# Patient Record
Sex: Female | Born: 1990
Health system: Southern US, Community
[De-identification: ages and names within clinical notes are randomized; demographics above are authoritative.]

## PROBLEM LIST (undated history)

## (undated) ENCOUNTER — Encounter

## (undated) ENCOUNTER — Ambulatory Visit

## (undated) ENCOUNTER — Telehealth

## (undated) ENCOUNTER — Ambulatory Visit: Payer: BLUE CROSS/BLUE SHIELD

## (undated) ENCOUNTER — Encounter
Attending: Student in an Organized Health Care Education/Training Program | Primary: Student in an Organized Health Care Education/Training Program

## (undated) DIAGNOSIS — F909 Attention-deficit hyperactivity disorder, unspecified type: Secondary | ICD-10-CM

## (undated) DIAGNOSIS — Q210 Ventricular septal defect: Secondary | ICD-10-CM

## (undated) DIAGNOSIS — D573 Sickle-cell trait: Secondary | ICD-10-CM

## (undated) DIAGNOSIS — R7303 Prediabetes: Secondary | ICD-10-CM

## (undated) DIAGNOSIS — A048 Other specified bacterial intestinal infections: Secondary | ICD-10-CM

## (undated) DIAGNOSIS — O020 Blighted ovum and nonhydatidiform mole: Secondary | ICD-10-CM

## (undated) DIAGNOSIS — R011 Cardiac murmur, unspecified: Secondary | ICD-10-CM

## (undated) DIAGNOSIS — E669 Obesity, unspecified: Secondary | ICD-10-CM

## (undated) DIAGNOSIS — Z973 Presence of spectacles and contact lenses: Secondary | ICD-10-CM

## (undated) DIAGNOSIS — O26872 Cervical shortening, second trimester: Secondary | ICD-10-CM

## (undated) DIAGNOSIS — E785 Hyperlipidemia, unspecified: Secondary | ICD-10-CM

## (undated) DIAGNOSIS — R7301 Impaired fasting glucose: Secondary | ICD-10-CM

## (undated) DIAGNOSIS — A749 Chlamydial infection, unspecified: Secondary | ICD-10-CM

## (undated) HISTORY — DX: Obesity, unspecified: E66.9

## (undated) HISTORY — DX: Sickle-cell trait: D57.3

## (undated) HISTORY — DX: Chlamydial infection, unspecified: A74.9

## (undated) HISTORY — DX: Hyperlipidemia, unspecified: E78.5

## (undated) HISTORY — DX: Impaired fasting glucose: R73.01

## (undated) HISTORY — DX: Attention-deficit hyperactivity disorder, unspecified type: F90.9

## (undated) HISTORY — PX: WISDOM TOOTH EXTRACTION: SHX21

## (undated) HISTORY — DX: Presence of spectacles and contact lenses: Z97.3

## (undated) HISTORY — DX: Ventricular septal defect: Q21.0

## (undated) HISTORY — DX: Other specified bacterial intestinal infections: A04.8

---
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
## 2011-03-14 ENCOUNTER — Encounter: Payer: Self-pay | Admitting: Internal Medicine

## 2011-03-15 ENCOUNTER — Encounter: Payer: Self-pay | Admitting: Family Medicine

## 2011-03-15 ENCOUNTER — Ambulatory Visit (INDEPENDENT_AMBULATORY_CARE_PROVIDER_SITE_OTHER): Payer: 59 | Admitting: Family Medicine

## 2011-03-15 DIAGNOSIS — E669 Obesity, unspecified: Secondary | ICD-10-CM

## 2011-03-15 DIAGNOSIS — Z79899 Other long term (current) drug therapy: Secondary | ICD-10-CM

## 2011-03-15 DIAGNOSIS — E119 Type 2 diabetes mellitus without complications: Secondary | ICD-10-CM

## 2011-03-15 DIAGNOSIS — Z9119 Patient's noncompliance with other medical treatment and regimen: Secondary | ICD-10-CM

## 2011-03-15 LAB — LIPID PANEL
HDL: 36 mg/dL (ref >34)
LDL Cholesterol: 112 mg/dL — ABNORMAL HIGH (ref 0–109)
Total CHOL/HDL Ratio: 4.9 Ratio
Triglycerides: 133 mg/dL (ref <150)

## 2011-03-15 LAB — CBC WITH DIFFERENTIAL/PLATELET
Basophils Absolute: 0 10*3/uL (ref 0.0–0.1)
Eosinophils Relative: 2 % (ref 0–5)
HCT: 38.8 % (ref 36.0–46.0)
Hemoglobin: 13 g/dL (ref 12.0–15.0)
Lymphocytes Relative: 37 % (ref 12–46)
Lymphs Abs: 2.1 10*3/uL (ref 0.7–4.0)
MCV: 80 fL (ref 78.0–100.0)
Monocytes Absolute: 0.4 10*3/uL (ref 0.1–1.0)
Monocytes Relative: 7 % (ref 3–12)
Neutro Abs: 3.1 10*3/uL (ref 1.7–7.7)
RBC: 4.85 MIL/uL (ref 3.87–5.11)
WBC: 5.8 10*3/uL (ref 4.0–10.5)

## 2011-03-15 LAB — COMPREHENSIVE METABOLIC PANEL
ALT: 34 U/L (ref 0–35)
Alkaline Phosphatase: 82 U/L (ref 39–117)
CO2: 27 mEq/L (ref 19–32)
Creat: 0.87 mg/dL (ref 0.50–1.10)
Total Bilirubin: 0.8 mg/dL (ref 0.3–1.2)

## 2011-03-15 LAB — POCT GLYCOSYLATED HEMOGLOBIN (HGB A1C): Hemoglobin A1C: 7.6

## 2011-03-15 MED ORDER — COLESEVELAM HCL 3.75 G PO PACK: 1.0000 | PACK | ORAL | Status: DC

## 2011-03-15 NOTE — Patient Instructions (Signed)
If you have problems with the WelChol, let me know. Use the American diabetes association website or [facility]

## 2011-03-15 NOTE — Progress Notes (Signed)
  Subjective:    Patient ID: Lynn Tucker, female    DOB: May 16, 1990, 20 y.o.   MRN: [id number]  HPI She is here for a followup visit. She has not been seen since December of last year. She has not been taking her medicines on a regular basis. She did not go to diabetes education but did state that she did go to a website to get information. She recently started taking her metformin on a more regular basis but has had some difficulties with abdominal distress and diarrhea.   Review of Systems     Objective:   Physical Exam Alert and in no distress otherwise not examined. Hemoglobin A1c is 7.6.       Assessment & Plan:   1. Diabetes mellitus  POCT HgB A1C, CBC with Differential, Lipid panel, Comprehensive metabolic panel  2. Obesity (BMI 30-39.9)    3. Personal history of noncompliance with medical treatment, presenting hazards to health    4. Encounter for long-term (current) use of other medications  CBC with Differential, Lipid panel, Comprehensive metabolic panel   I had a long discussion with her concerning taking better care of her diabetes and being more compliant. I discussed diet, exercise, eyecare, foot care, risk of heart disease and renal failure. Strongly encouraged her to take a more positive than proactive approach towards this. She'll be set up for dietary education later today. Recheck here in several months. I will also give her WelChol help with the GI distress.

## 2011-03-20 ENCOUNTER — Telehealth: Payer: Self-pay | Admitting: Internal Medicine

## 2011-03-20 MED ORDER — GLUCOSE BLOOD VI STRP: ORAL_STRIP | Status: DC

## 2011-03-20 NOTE — Telephone Encounter (Signed)
Approved.  

## 2011-04-18 ENCOUNTER — Ambulatory Visit: Payer: 59 | Admitting: Family Medicine

## 2011-04-24 ENCOUNTER — Encounter: Payer: Self-pay | Admitting: Family Medicine

## 2011-04-24 ENCOUNTER — Ambulatory Visit (INDEPENDENT_AMBULATORY_CARE_PROVIDER_SITE_OTHER): Payer: 59 | Admitting: Family Medicine

## 2011-04-24 DIAGNOSIS — E119 Type 2 diabetes mellitus without complications: Secondary | ICD-10-CM

## 2011-04-24 DIAGNOSIS — E669 Obesity, unspecified: Secondary | ICD-10-CM

## 2011-04-24 NOTE — Patient Instructions (Signed)
Keep up the good work with your diet and exercise. Recheck here in 3 months.

## 2011-04-24 NOTE — Progress Notes (Signed)
  Subjective:    Patient ID: Lynn Tucker, female    DOB: 1990/11/01, 21 y.o.   MRN: [id number]  HPI She is here for recheck. She did go to diabetes education. She has made some dietary changes and is now checking her blood sugars. She is checking them before meals and approximately 2 hours after. She intermittently uses the WelChol to help with the diarrhea caused by the metformin   Review of Systems     Objective:   Physical Exam Alert and in no distress. Height and weight are recorded.       Assessment & Plan:   1. Diabetes mellitus   2. Obesity (BMI 30-39.9)    encouraged her to continue with her present diet and lifestyle modification. Recheck here in several months.

## 2011-05-25 ENCOUNTER — Telehealth: Payer: Self-pay | Admitting: Family Medicine

## 2011-05-25 NOTE — Telephone Encounter (Signed)
Take care of this for me. 

## 2011-05-25 NOTE — Telephone Encounter (Signed)
PT SISTER INFORMED OF METER BEING UP FRONT

## 2011-05-25 NOTE — Telephone Encounter (Signed)
Pt lost meter and test strips   Can she come in and get another one?  Please call

## 2011-06-14 ENCOUNTER — Other Ambulatory Visit: Payer: Self-pay | Admitting: Family Medicine

## 2011-07-24 ENCOUNTER — Ambulatory Visit (INDEPENDENT_AMBULATORY_CARE_PROVIDER_SITE_OTHER): Payer: 59 | Admitting: Family Medicine

## 2011-07-24 ENCOUNTER — Encounter: Payer: Self-pay | Admitting: Family Medicine

## 2011-07-24 DIAGNOSIS — E669 Obesity, unspecified: Secondary | ICD-10-CM

## 2011-07-24 DIAGNOSIS — E119 Type 2 diabetes mellitus without complications: Secondary | ICD-10-CM

## 2011-07-24 DIAGNOSIS — E785 Hyperlipidemia, unspecified: Secondary | ICD-10-CM | POA: Insufficient documentation

## 2011-07-24 LAB — POCT GLYCOSYLATED HEMOGLOBIN (HGB A1C): Hemoglobin A1C: 7.2

## 2011-07-24 MED ORDER — PRAVASTATIN SODIUM 20 MG PO TABS: 20.0000 mg | ORAL_TABLET | Freq: Every day | ORAL | Status: DC

## 2011-07-24 NOTE — Progress Notes (Signed)
  Subjective:    Patient ID: Lynn Tucker, female    DOB: 09-12-1990, 20 y.o.   MRN: [id number]  HPI She is here for a diabetes recheck. She is no longer using Questran which she was using for diarrhea. Her weight is stable. She has done some research and diabetes. She would like to be sent to nutritionist. She does not smoke or drink. She is not presently sexually active. She recently started into a exercise program.   Review of Systems     Objective:   Physical Exam Alert and in no distress. Hemoglobin A1c 7.2      Assessment & Plan:   1. Diabetes mellitus  Amb ref to Medical Nutrition Therapy-[facility]  2. Obesity (BMI 30-39.9)  Amb ref to Medical Nutrition Therapy-[facility]  3. Hyperlipidemia LDL goal <70  Amb ref to Medical Nutrition Therapy-[facility], pravastatin (PRAVACHOL) 20 MG tablet   I discussed diet, exercise, foot care, I care. Strongly encouraged her to get at least 150 minutes week of physical activity. I will place her on Pravachol. Discussed possible side effects with her. Recheck here in 4 months.

## 2011-07-24 NOTE — Progress Notes (Signed)
Addended by: Barbette Or A on: 07/24/2011 01:27 PM   Modules accepted: Orders

## 2011-09-06 ENCOUNTER — Encounter: Payer: 59 | Attending: Family Medicine | Admitting: Dietician

## 2011-09-06 VITALS — Ht 60.0 in | Wt 190.5 lb

## 2011-09-06 DIAGNOSIS — Z713 Dietary counseling and surveillance: Secondary | ICD-10-CM | POA: Insufficient documentation

## 2011-09-06 DIAGNOSIS — E785 Hyperlipidemia, unspecified: Secondary | ICD-10-CM

## 2011-09-06 DIAGNOSIS — E119 Type 2 diabetes mellitus without complications: Secondary | ICD-10-CM | POA: Insufficient documentation

## 2011-09-06 DIAGNOSIS — E669 Obesity, unspecified: Secondary | ICD-10-CM

## 2011-09-07 ENCOUNTER — Encounter: Payer: Self-pay | Admitting: Dietician

## 2011-09-07 NOTE — Progress Notes (Signed)
  Patient was seen on 09/06/2011 for the first of a series of three diabetes self-management courses at the [facility]. The following learning objectives were met by the patient during this course:   Defines the role of glucose and insulin  Identifies type of diabetes and pathophysiology  Defines the diagnostic criteria for diabetes and prediabetes  States the risk factors for Type 2 Diabetes  States the symptoms of Type 2 Diabetes  Defines Type 2 Diabetes treatment goals  Defines Type 2 Diabetes treatment options  States the rationale for glucose monitoring  Identifies A1C, glucose targets, and testing times  Identifies proper sharps disposal  Defines the purpose of a diabetes food plan  Identifies carbohydrate food groups  Defines effects of carbohydrate foods on glucose levels  Identifies carbohydrate choices/grams/food labels  States benefits of physical activity and effect on glucose  Review of suggested activity guidelines  Handouts given during class include:  Type 2 Diabetes: Basics Book  My Food Plan Book  Food and Activity Log  HgA1C:7.2%  Patient has established the following initial goals:  Increase exercise  Work on stress level  Monitor blood glucose  Follow a diabetes meal plan  Take medications appropriately   Lose weight  Follow-Up Plan: Attend the diabetes self-management Core Classes.

## 2011-09-19 ENCOUNTER — Encounter: Payer: 59 | Attending: Family Medicine

## 2011-09-19 DIAGNOSIS — E785 Hyperlipidemia, unspecified: Secondary | ICD-10-CM | POA: Insufficient documentation

## 2011-09-19 DIAGNOSIS — Z713 Dietary counseling and surveillance: Secondary | ICD-10-CM | POA: Insufficient documentation

## 2011-09-19 DIAGNOSIS — E669 Obesity, unspecified: Secondary | ICD-10-CM | POA: Insufficient documentation

## 2011-09-19 DIAGNOSIS — E119 Type 2 diabetes mellitus without complications: Secondary | ICD-10-CM | POA: Insufficient documentation

## 2011-09-26 ENCOUNTER — Ambulatory Visit: Payer: 59

## 2011-11-27 ENCOUNTER — Ambulatory Visit: Payer: 59 | Admitting: Family Medicine

## 2011-12-15 ENCOUNTER — Encounter: Payer: Self-pay | Admitting: Family Medicine

## 2011-12-15 ENCOUNTER — Ambulatory Visit (INDEPENDENT_AMBULATORY_CARE_PROVIDER_SITE_OTHER): Payer: 59 | Admitting: Family Medicine

## 2011-12-15 ENCOUNTER — Other Ambulatory Visit: Payer: Self-pay

## 2011-12-15 VITALS — BP 130/80 | HR 90 | Wt 188.0 lb

## 2011-12-15 DIAGNOSIS — E669 Obesity, unspecified: Secondary | ICD-10-CM

## 2011-12-15 DIAGNOSIS — E119 Type 2 diabetes mellitus without complications: Secondary | ICD-10-CM

## 2011-12-15 DIAGNOSIS — E785 Hyperlipidemia, unspecified: Secondary | ICD-10-CM

## 2011-12-15 LAB — POCT GLYCOSYLATED HEMOGLOBIN (HGB A1C): Hemoglobin A1C: 7.2

## 2011-12-15 MED ORDER — GLUCOSE BLOOD VI STRP: ORAL_STRIP | Status: DC

## 2011-12-15 NOTE — Telephone Encounter (Signed)
Test strips ordered.

## 2011-12-15 NOTE — Patient Instructions (Signed)
Go ahead and start exercising again and make the dietary changes that we talked about. Cut back on white food!

## 2011-12-15 NOTE — Progress Notes (Signed)
  Subjective:    Patient ID: Lynn Tucker, female    DOB: 08/24/1990, 21 y.o.   MRN: [id number]  HPI She is here for a diabetes recheck. Since her last visit she did see the nutritionist and did make some changes in her diet and exercise pattern. She also stopped taking her metformin but did not give me a good reason as to why she did this. She does continue on her other medications. 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   Review of Systems     Objective:   Physical Exam Alert and in no distress. Hemoglobin A1c is 7.2       Assessment & Plan:   1. Diabetes mellitus  POCT glycosylated hemoglobin (Hb A1C)  2. Obesity (BMI 30-39.9)    3. Hyperlipidemia LDL goal <70     over 20 minutes spent discussing diet, exercise, images of diabetes can do to her. Encouraged her to make further diet and exercise changes. Discussed cutting back on carbohydrates. I will not place her back on metformin at this time.

## 2012-02-07 ENCOUNTER — Ambulatory Visit (INDEPENDENT_AMBULATORY_CARE_PROVIDER_SITE_OTHER): Payer: BC Managed Care – PPO | Admitting: Medical

## 2012-02-07 ENCOUNTER — Encounter: Payer: Self-pay | Admitting: Medical

## 2012-02-07 VITALS — BP 110/80 | HR 80 | Temp 98.6°F | Resp 16 | Ht 60.5 in | Wt 190.0 lb

## 2012-02-07 DIAGNOSIS — E785 Hyperlipidemia, unspecified: Secondary | ICD-10-CM

## 2012-02-07 DIAGNOSIS — E119 Type 2 diabetes mellitus without complications: Secondary | ICD-10-CM

## 2012-02-07 DIAGNOSIS — N912 Amenorrhea, unspecified: Secondary | ICD-10-CM

## 2012-02-07 DIAGNOSIS — L680 Hirsutism: Secondary | ICD-10-CM

## 2012-02-07 DIAGNOSIS — Z0000 Encounter for general adult medical examination without abnormal findings: Secondary | ICD-10-CM

## 2012-02-07 DIAGNOSIS — Q210 Ventricular septal defect: Secondary | ICD-10-CM

## 2012-02-07 DIAGNOSIS — E669 Obesity, unspecified: Secondary | ICD-10-CM

## 2012-02-07 DIAGNOSIS — N911 Secondary amenorrhea: Secondary | ICD-10-CM

## 2012-02-07 DIAGNOSIS — L732 Hidradenitis suppurativa: Secondary | ICD-10-CM

## 2012-02-07 LAB — POCT URINALYSIS DIPSTICK
Nitrite, UA: NEGATIVE
Spec Grav, UA: 1.01
Urobilinogen, UA: NEGATIVE

## 2012-02-07 MED ORDER — SULFAMETHOXAZOLE-TRIMETHOPRIM 800-160 MG PO TABS: 1.0000 | ORAL_TABLET | Freq: Two times a day (BID) | ORAL | Status: DC

## 2012-02-07 NOTE — Progress Notes (Signed)
Subjective:   HPI  Lynn Tucker is a 21 y.o. female who presents for a complete physical.  Preventative care: Last ophthalmology visit: [facility] Last dental visit: Dr. Tenny Craw Last tetanus booster: 2009 Flu vaccine:declines  Reviewed their medical, surgical, family, social, medication, and allergy history and updated chart as appropriate.  Past Medical History  Diagnosis Date  . Sickle cell trait   . VSD (ventricular septal defect)     followed by cardiology every 2 years  . Obesity   . ADHD (attention deficit hyperactivity disorder)   . Diabetes mellitus   . Hyperlipidemia   . Wears glasses     Past Surgical History  Procedure Date  . Wisdom tooth extraction     Family History  Problem Relation Age of Onset  . Cancer Other   . Hyperlipidemia Other   . Stroke Other   . Heart attack Other   . Diabetes Other   . Hypertension Mother   . Sickle cell trait Father   . Heart disease Neg Hx     History   Social History  . Marital Status: Single    Spouse Name: N/A    Number of Children: N/A  . Years of Education: N/A   Occupational History  . Not on file.   Social History Main Topics  . Smoking status: Never Smoker   . Smokeless tobacco: Never Used  . Alcohol Use: No  . Drug Use: No  . Sexually Active: No   Other Topics Concern  . 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    Current Outpatient Prescriptions on File Prior to Visit  Medication Sig Dispense Refill  . glucose blood test strip Use as instructed one touch ultra  100 each  prn  . pravastatin (PRAVACHOL) 20 MG tablet Take 1 tablet (20 mg total) by mouth daily.  90 tablet  3  . metFORMIN (GLUCOPHAGE-XR) 500 MG 24 hr tablet TAKE ONE (1) TABLET(S) BY MOUTH TWICE DAILY BEFORE MEALS  60 tablet  1  . methylphenidate (RITALIN) 10 MG tablet Take 10 mg by mouth 2 (two)  times daily.          No Known Allergies   Review of Systems Constitutional: -fever, -chills, -sweats, -unexpected weight change, -anorexia, -fatigue Allergy: -sneezing, -itching, -congestion Dermatology: denies changing moles, rash, lumps, new worrisome lesions ENT: -runny nose, -ear pain, -sore throat, -hoarseness, -sinus pain, -teeth pain, -tinnitus, -hearing loss, +epistaxis Cardiology:  -chest pain, -palpitations, -edema, -orthopnea, -paroxysmal nocturnal dyspnea Respiratory: -cough, -shortness of breath, -dyspnea on exertion, -wheezing, -hemoptysis Gastroenterology: -abdominal pain, -nausea, -vomiting, -diarrhea, -constipation, -blood in stool, -changes in bowel movement, -dysphagia Hematology: -bleeding or bruising problems Musculoskeletal: -arthralgias, -myalgias, -joint swelling, -back pain, -neck pain, -cramping, -gait changes Ophthalmology: -vision changes, -eye redness, -itching, -discharge Urology: -dysuria, -difficulty urinating, -hematuria, -urinary frequency, -urgency, incontinence Neurology: -headache, -weakness, -tingling, -numbness, -speech abnormality, -memory loss, -falls, -dizziness Psychology:  -depressed mood, -agitation, -sleep problems     Objective:   Physical Exam  Filed Vitals:   02/07/12 1333  BP: 110/80  Pulse: 80  Temp: 98.6 F (37 C)  Resp: 16    General appearance: alert, no distress, WD/WN, AA female, obese Skin: increased body hair on arms, abdomen, tender indurated multiple cystic lesions in bilat axilla, no warmth, drainage or fluctuance though HEENT: normocephalic, conjunctiva/corneas normal, sclerae anicteric, PERRLA, EOMi,  nares patent, no discharge or erythema, pharynx normal Oral cavity: MMM, tongue normal, teeth in good repair Neck: supple, no lymphadenopathy, no thyromegaly, no masses, normal ROM, no bruits Chest: non tender, normal shape and expansion Heart: RRR, normal S1, S2, no murmurs Lungs: CTA bilaterally, no wheezes,  rhonchi, or rales Abdomen: +bs, soft, non tender, non distended, no masses, no hepatomegaly, no splenomegaly, no bruits Back: non tender, normal ROM, no scoliosis Musculoskeletal: upper extremities non tender, no obvious deformity, normal ROM throughout, lower extremities non tender, no obvious deformity, normal ROM throughout Extremities: no edema, no cyanosis, no clubbing Pulses: 2+ symmetric, upper and lower extremities, normal cap refill Neurological: alert, oriented x 3, CN2-12 intact, strength normal upper extremities and lower extremities, sensation normal throughout, DTRs 2+ throughout, no cerebellar signs, gait normal Psychiatric: normal affect, behavior normal, pleasant  Breast/gyn/rectal - deferred   Assessment and Plan :    Encounter Diagnoses  Name Primary?  . Routine general medical examination at a health care facility Yes  . Type II or unspecified type diabetes mellitus without mention of complication, not stated as uncontrolled   . Hidradenitis axillaris   . Hyperlipidemia   . Secondary amenorrhea   . Obesity   . Hirsutism   . VSD (ventricular septal defect)    Physical exam - discussed healthy lifestyle, diet, exercise, preventative care, vaccinations, and addressed their concerns.  Handout given.  Advised flu and pneumococcal vaccines but she declines both today.  DM type II - diet controlled.  Was just seen here recently for diabetic f/u.  Has been on Metformin prior  Hidradenitis - script for bactrim, warm compresses, avoid deodorant temporarily.  discussed diagnosis and treatment  Hyperlipidemia - labs today, c/t same medication  Secondary amenorrhea - likely PCOS.  Was on OCPs x 39mo through gynecology but stopped the medication for unknown reason.  will discuss case with supervising physician, Dr. Susann Givens.  Obesity - need to exercise, eat healthier,work on weight loss  Hirsutism - likely due to PCOS  VSD - reviewed 2011 cardiology notes and last echo in  chart.  Will discuss case with Dr. Susann Givens.  No murmur heard today.  Follow-up pending labs

## 2012-02-08 ENCOUNTER — Encounter: Payer: Self-pay | Admitting: Medical

## 2012-02-08 DIAGNOSIS — E119 Type 2 diabetes mellitus without complications: Secondary | ICD-10-CM | POA: Insufficient documentation

## 2012-02-08 DIAGNOSIS — E785 Hyperlipidemia, unspecified: Secondary | ICD-10-CM | POA: Insufficient documentation

## 2012-02-08 DIAGNOSIS — N911 Secondary amenorrhea: Secondary | ICD-10-CM | POA: Insufficient documentation

## 2012-02-13 ENCOUNTER — Other Ambulatory Visit: Payer: Self-pay | Admitting: Medical

## 2012-02-13 ENCOUNTER — Telehealth: Payer: Self-pay | Admitting: Family Medicine

## 2012-02-13 DIAGNOSIS — N911 Secondary amenorrhea: Secondary | ICD-10-CM

## 2012-02-13 DIAGNOSIS — L680 Hirsutism: Secondary | ICD-10-CM

## 2012-02-13 DIAGNOSIS — E119 Type 2 diabetes mellitus without complications: Secondary | ICD-10-CM

## 2012-02-13 DIAGNOSIS — E785 Hyperlipidemia, unspecified: Secondary | ICD-10-CM

## 2012-02-13 DIAGNOSIS — E669 Obesity, unspecified: Secondary | ICD-10-CM

## 2012-02-13 NOTE — Telephone Encounter (Signed)
PATIENT IS AWARE OF THE LAB WORK AND SHE HAS AN APPOINTMENT ON 02/26/12 FOR LAB VISIT. PER DAVID TYSINGER WE WILL HOLD OFF ON THE PELVIS U/S UNTIL WE GET THE LAB WORK BACK. CLS

## 2012-02-13 NOTE — Telephone Encounter (Signed)
Message copied by Janeice Robinson on Tue Feb 13, 2012  1:19 PM ------      Message from: Jac Canavan      Created: Tue Feb 13, 2012  5:55 AM       I have reviewed her chart.  Pls have her come by for fasting labs and set her up for pelvic ultrasound (non - transvaginal).  We are evaluating her for secondary amenorrhea, likely PCOS.

## 2012-02-16 ENCOUNTER — Other Ambulatory Visit: Payer: BC Managed Care – PPO

## 2012-02-23 ENCOUNTER — Other Ambulatory Visit: Payer: BC Managed Care – PPO

## 2012-02-23 DIAGNOSIS — N911 Secondary amenorrhea: Secondary | ICD-10-CM

## 2012-02-23 DIAGNOSIS — E669 Obesity, unspecified: Secondary | ICD-10-CM

## 2012-02-23 DIAGNOSIS — E785 Hyperlipidemia, unspecified: Secondary | ICD-10-CM

## 2012-02-23 DIAGNOSIS — E119 Type 2 diabetes mellitus without complications: Secondary | ICD-10-CM

## 2012-02-23 LAB — CBC
HCT: 36.7 % (ref 36.0–46.0)
MCV: 77.1 fL — ABNORMAL LOW (ref 78.0–100.0)
Platelets: 394 10*3/uL (ref 150–400)
RBC: 4.76 MIL/uL (ref 3.87–5.11)
RDW: 15.8 % — ABNORMAL HIGH (ref 11.5–15.5)
WBC: 5.6 10*3/uL (ref 4.0–10.5)

## 2012-02-23 LAB — PROLACTIN: Prolactin: 8.6 ng/mL

## 2012-02-23 LAB — COMPREHENSIVE METABOLIC PANEL
ALT: 18 U/L (ref 0–35)
AST: 20 U/L (ref 0–37)
Albumin: 4.5 g/dL (ref 3.5–5.2)
CO2: 27 mEq/L (ref 19–32)
Calcium: 9.9 mg/dL (ref 8.4–10.5)
Chloride: 102 mEq/L (ref 96–112)
Potassium: 4.1 mEq/L (ref 3.5–5.3)

## 2012-02-23 LAB — LIPID PANEL
Cholesterol: 156 mg/dL (ref 0–200)
Total CHOL/HDL Ratio: 4.6 Ratio
Triglycerides: 101 mg/dL (ref <150)
VLDL: 20 mg/dL (ref 0–40)

## 2012-02-23 LAB — HCG, QUANTITATIVE, PREGNANCY: hCG, Beta Chain, Quant, S: <2 m[IU]/mL

## 2012-02-23 NOTE — Addendum Note (Signed)
Addended by: Leretha Dykes L on: 02/23/2012 10:28 AM   Modules accepted: Orders

## 2012-02-27 LAB — 17-HYDROXYPROGESTERONE: 17-OH-Progesterone, LC/MS/MS: 44 ng/dL

## 2012-03-04 ENCOUNTER — Telehealth: Payer: Self-pay | Admitting: Family Medicine

## 2012-03-04 NOTE — Telephone Encounter (Signed)
Patient has a follow up appointment with you on 03/15/12. CLS

## 2012-03-04 NOTE — Telephone Encounter (Signed)
Message copied by Janeice Robinson on Mon Mar 04, 2012  9:32 AM ------      Message from: [place], DAVID S      Created: Mon Mar 04, 2012  9:23 AM       F/u appt to discuss labs, treatment for amenorrhea, PCOS.

## 2012-03-15 ENCOUNTER — Encounter: Payer: Self-pay | Admitting: Medical

## 2012-03-15 ENCOUNTER — Ambulatory Visit (INDEPENDENT_AMBULATORY_CARE_PROVIDER_SITE_OTHER): Payer: BC Managed Care – PPO | Admitting: Medical

## 2012-03-15 VITALS — BP 108/60 | HR 80 | Temp 98.1°F | Resp 16 | Wt 196.0 lb

## 2012-03-15 DIAGNOSIS — L732 Hidradenitis suppurativa: Secondary | ICD-10-CM

## 2012-03-15 DIAGNOSIS — N912 Amenorrhea, unspecified: Secondary | ICD-10-CM

## 2012-03-15 DIAGNOSIS — E282 Polycystic ovarian syndrome: Secondary | ICD-10-CM

## 2012-03-15 DIAGNOSIS — E785 Hyperlipidemia, unspecified: Secondary | ICD-10-CM

## 2012-03-15 DIAGNOSIS — N911 Secondary amenorrhea: Secondary | ICD-10-CM

## 2012-03-15 DIAGNOSIS — L680 Hirsutism: Secondary | ICD-10-CM

## 2012-03-15 DIAGNOSIS — E119 Type 2 diabetes mellitus without complications: Secondary | ICD-10-CM

## 2012-03-15 MED ORDER — METFORMIN HCL 500 MG PO TABS: 500.0000 mg | ORAL_TABLET | Freq: Two times a day (BID) | ORAL | Status: DC

## 2012-03-15 MED ORDER — LEVONORGEST-ETH ESTRAD 91-DAY 0.15-0.03 &0.01 MG PO TABS: 1.0000 | ORAL_TABLET | Freq: Every day | ORAL | Status: DC

## 2012-03-15 MED ORDER — DOXYCYCLINE HYCLATE 100 MG PO TABS: 100.0000 mg | ORAL_TABLET | Freq: Two times a day (BID) | ORAL | Status: DC

## 2012-03-15 MED ORDER — PRAVASTATIN SODIUM 40 MG PO TABS: 40.0000 mg | ORAL_TABLET | Freq: Every day | ORAL | Status: DC

## 2012-03-15 NOTE — Patient Instructions (Signed)
Begin back on Metformin, 500mg , 1/2 tablet twice daily for a week, then go to 1 tablet twice daily.  Begin back on Pravachol for cholesterol but at higher dose.   Begin for cysts in your arm pits.  Continue warm compresses. If this isn't improving in a few weeks, let me know.  Polycystic Ovarian Syndrome Polycystic ovarian syndrome is a condition with a number of problems. One problem is with the ovaries. The ovaries are organs located in the female pelvis, on each side of the uterus. Usually, during the menstrual cycle, an egg is released from 1 ovary every month. This is called ovulation. When the egg is fertilized, it goes into the womb (uterus), which allows for the growth of a baby. The egg travels from the ovary through the fallopian tube to the uterus. The ovaries also make the hormones estrogen and progesterone. These hormones help the development of a woman's breasts, body shape, and body hair. They also regulate the menstrual cycle and pregnancy. Sometimes, cysts form in the ovaries. A cyst is a fluid-filled sac. On the ovary, different types of cysts can form. The most common type of ovarian cyst is called a functional or ovulation cyst. It is normal, and often forms during the normal menstrual cycle. Each month, a woman's ovaries grow tiny cysts that hold the eggs. When an egg is fully grown, the sac breaks open. This releases the egg. Then, the sac which released the egg from the ovary dissolves. In one type of functional cyst, called a follicle cyst, the sac does not break open to release the egg. It may actually continue to grow. This type of cyst usually disappears within 1 to 3 months.  One type of cyst problem with the ovaries is called Polycystic Ovarian Syndrome (PCOS). In this condition, many follicle cysts form, but do not rupture and produce an egg. This health problem can affect the following:  Menstrual cycle.  Heart.  Obesity.  Cancer of the uterus.  Fertility.  Blood  vessels.  Hair growth (face and body) or baldness.  Hormones.  Appearance.  High blood pressure.  Stroke.  Insulin production.  Inflammation of the liver.  Elevated blood cholesterol and triglycerides. CAUSES   No one knows the exact cause of PCOS.  Women with PCOS often have a mother or sister with PCOS. There is not yet enough proof to say this is inherited.  Many women with PCOS have a weight problem.  Researchers are looking at the relationship between PCOS and the body's ability to make insulin. Insulin is a hormone that regulates the change of sugar, starches, and other food into energy for the body's use, or for storage. Some women with PCOS make too much insulin. It is possible that the ovaries react by making too many female hormones, called androgens. This can lead to acne, excessive hair growth, weight gain, and ovulation problems.  Too much production of luteinizing hormone (LH) from the pituitary gland in the brain stimulates the ovary to produce too much female hormone (androgen). SYMPTOMS   Infrequent or no menstrual periods, and/or irregular bleeding.  Inability to get pregnant (infertility), because of not ovulating.  Increased growth of hair on the face, chest, stomach, back, thumbs, thighs, or toes.  Acne, oily skin, or dandruff.  Pelvic pain.  Weight gain or obesity, usually carrying extra weight around the waist.  Type 2 diabetes (this is the diabetes that usually does not need insulin).  High cholesterol.  High blood pressure.  Female-pattern baldness or thinning hair.  Patches of thickened and dark brown or black skin on the neck, arms, breasts, or thighs.  Skin tags, or tiny excess flaps of skin, in the armpits or neck area.  Sleep apnea (excessive snoring and breathing stops at times while asleep).  Deepening of the voice.  Gestational diabetes when pregnant.  Increased risk of miscarriage with pregnancy. DIAGNOSIS  There is no single  test to diagnose PCOS.   Your caregiver will:  Take a medical history.  Perform a pelvic exam.  Perform an ultrasound.  Check your female and female hormone levels.  Measure glucose or sugar levels in the blood.  Do other blood tests.  If you are producing too many female hormones, your caregiver will make sure it is from PCOS. At the physical exam, your caregiver will want to evaluate the areas of increased hair growth. Try to allow natural hair growth for a few days before the visit.  During a pelvic exam, the ovaries may be enlarged or swollen by the increased number of small cysts. This can be seen more easily by vaginal ultrasound or screening, to examine the ovaries and lining of the uterus (endometrium) for cysts. The uterine lining may become thicker, if there has not been a regular period. TREATMENT  Because there is no cure for PCOS, it needs to be managed to prevent problems. Treatments are based on your symptoms. Treatment is also based on whether you want to have a baby or whether you need contraception.  Treatment may include:  Progesterone hormone, to start a menstrual period.  Birth control pills, to make you have regular menstrual periods.  Medicines to make you ovulate, if you want to get pregnant.  Medicines to control your insulin.  Medicine to control your blood pressure.  Medicine and diet, to control your high cholesterol and triglycerides in your blood.  Surgery, making small holes in the ovary, to decrease the amount of female hormone production. This is done through a long, lighted tube (laparoscope), placed into the pelvis through a tiny incision in the lower abdomen. Your caregiver will go over some of the choices with you. WOMEN WITH PCOS HAVE THESE CHARACTERISTICS:  High levels of female hormones called androgens.  An irregular or no menstrual cycle.  May have many small cysts in their ovaries. PCOS is the most common hormonal reproductive problem in  women of childbearing age. WHY DO WOMEN WITH PCOS HAVE TROUBLE WITH THEIR MENSTRUAL CYCLE? Each month, about 20 eggs start to mature in the ovaries. As one egg grows and matures, the follicle breaks open to release the egg, so it can travel through the fallopian tube for fertilization. When the single egg leaves the follicle, ovulation takes place. In women with PCOS, the ovary does not make all of the hormones it needs for any of the eggs to fully mature. They may start to grow and accumulate fluid, but no one egg becomes large enough. Instead, some may remain as cysts. Since no egg matures or is released, ovulation does not occur and the hormone progesterone is not made. Without progesterone, a woman's menstrual cycle is irregular or absent. Also, the cysts produce female hormones, which continue to prevent ovulation.  Document Released: 07/21/2004 Document Revised: 06/19/2011 Document Reviewed: 02/12/2009 [facility] Patient Information 2013 [place], [place].

## 2012-03-16 ENCOUNTER — Encounter: Payer: Self-pay | Admitting: Medical

## 2012-03-16 NOTE — Progress Notes (Signed)
Subjective:   HPI  Lynn Tucker is a 21 y.o. female who presents for f/u at my request.  She has had signs of PCOS including deeper voice, excess body hair, no periods in a few years although she initially has had onset of menarche and initially had periods.   She actually doesn't mind not having a period.  She has never been sexually active.  She is also diet controlled diabetic.  Has been using Pravachol for cholesterol although she ran out recently.  We had done labs to evaluate for secondary amenorrhea and PCOS and he is here for f/u.  Reviewed their medical, surgical, family, social, medication, and allergy history and updated chart as appropriate.  Past Medical History  Diagnosis Date  . Sickle cell trait   . VSD (ventricular septal defect)     followed by cardiology every 2 years  . Obesity   . ADHD (attention deficit hyperactivity disorder)   . Diabetes mellitus   . Hyperlipidemia   . Wears glasses     Past Surgical History  Procedure Date  . Wisdom tooth extraction     Family History  Problem Relation Age of Onset  . Cancer Other   . Hyperlipidemia Other   . Stroke Other   . Heart attack Other   . Diabetes Other   . Hypertension Mother   . Sickle cell trait Father   . Heart disease Neg Hx     History   Social History  . Marital Status: Single    Spouse Name: N/A    Number of Children: N/A  . Years of Education: N/A   Occupational History  . Not on file.   Social History Main Topics  . Smoking status: Never Smoker   . Smokeless tobacco: Never Used  . Alcohol Use: No  . Drug Use: No  . Sexually Active: No   Other Topics Concern  . 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    Current Outpatient Prescriptions on File Prior to Visit  Medication Sig Dispense Refill  . glucose blood test strip Use as instructed one touch ultra   100 each  prn  . Levonorgestrel-Ethinyl Estradiol (SEASONIQUE) 0.15-0.03 &0.01 MG tablet Take 1 tablet by mouth daily.  1 Package  4  . metFORMIN (GLUCOPHAGE) 500 MG tablet Take 1 tablet (500 mg total) by mouth 2 (two) times daily with a meal. 1 tablet po BID for diabetes  60 tablet  5  . methylphenidate (RITALIN) 10 MG tablet Take 10 mg by mouth 2 (two) times daily.        . pravastatin (PRAVACHOL) 40 MG tablet Take 1 tablet (40 mg total) by mouth daily.  30 tablet  5    No Known Allergies  ROS as in HPI     Objective:   Physical Exam  Filed Vitals:   03/15/12 0940  BP: 108/60  Pulse: 80  Temp: 98.1 F (36.7 C)  Resp: 16    General appearance: alert, no distress, WD/WN, AA female, obese Skin: increased body hair on arms, abdomen, tender indurated multiple cystic lesions in bilat axilla, no warmth, drainage or fluctuance though Heart: RRR, normal S1, S2, no murmurs Lungs: CTA bilaterally, no wheezes, rhonchi, or rales   Assessment and Plan :    Encounter Diagnoses  Name Primary?  . Secondary amenorrhea Yes  .  PCOS (polycystic ovarian syndrome)   . Type II or unspecified type diabetes mellitus without mention of complication, not stated as uncontrolled   . Hyperlipidemia   . Hidradenitis axillaris   . Hirsutism    Secondary amenorrhea - recent labs, symptoms, exam strongly suggest PCOS.  Was on OCPs x 73mo in remote past through gynecology but stopped the medication for unknown reason.  Will check DHEA-S today, discussed symptoms, diagnosis, treatment.  Begin metformin, discussed goals of therapy, and discussed OCP options.  She likes not having a period though.  Will call her back about recommendations for hormonal control of the PCOS.    DM type II - restart Metformin for both better diabetes control and for PCOS  Hyperlipidemia - increased Pravachol dose based on recent labs.  Recheck in 21mo.  Hidradenitis - course of Doxycycline, c/t warm compresses, and maybe  symptoms will improve with better hormonal cycles if we can correct the current amenorrhea/PCOS changes.  Hirsutism - due to PCOS

## 2012-03-18 LAB — DHEA-SULFATE: DHEA-SO4: 92 ug/dL (ref 35–430)

## 2012-03-19 ENCOUNTER — Other Ambulatory Visit: Payer: Self-pay | Admitting: Family Medicine

## 2012-03-19 MED ORDER — METFORMIN HCL 500 MG PO TABS: 500.0000 mg | ORAL_TABLET | Freq: Two times a day (BID) | ORAL | Status: DC

## 2012-03-19 MED ORDER — LEVONORGEST-ETH ESTRAD 91-DAY 0.15-0.03 &0.01 MG PO TABS: 1.0000 | ORAL_TABLET | Freq: Every day | ORAL | Status: DC

## 2012-03-19 MED ORDER — DOXYCYCLINE HYCLATE 100 MG PO TABS: 100.0000 mg | ORAL_TABLET | Freq: Two times a day (BID) | ORAL | Status: DC

## 2012-03-19 MED ORDER — PRAVASTATIN SODIUM 40 MG PO TABS: 40.0000 mg | ORAL_TABLET | Freq: Every day | ORAL | Status: DC

## 2012-03-19 NOTE — Telephone Encounter (Signed)
I recent the patients RX to [facility] and I called [facility] on [address] and cancelled the other RX that was sent there. CLS

## 2012-04-12 ENCOUNTER — Ambulatory Visit: Payer: 59 | Admitting: Family Medicine

## 2012-06-14 ENCOUNTER — Ambulatory Visit: Payer: BC Managed Care – PPO | Admitting: Medical

## 2012-06-19 ENCOUNTER — Ambulatory Visit (INDEPENDENT_AMBULATORY_CARE_PROVIDER_SITE_OTHER): Payer: 59 | Admitting: Medical

## 2012-06-19 ENCOUNTER — Encounter: Payer: Self-pay | Admitting: Medical

## 2012-06-19 VITALS — BP 110/60 | HR 72 | Temp 98.1°F | Resp 16 | Wt 184.0 lb

## 2012-06-19 DIAGNOSIS — E119 Type 2 diabetes mellitus without complications: Secondary | ICD-10-CM

## 2012-06-19 DIAGNOSIS — E785 Hyperlipidemia, unspecified: Secondary | ICD-10-CM

## 2012-06-19 DIAGNOSIS — E282 Polycystic ovarian syndrome: Secondary | ICD-10-CM

## 2012-06-19 DIAGNOSIS — L732 Hidradenitis suppurativa: Secondary | ICD-10-CM

## 2012-06-19 LAB — LIPID PANEL
Cholesterol: 146 mg/dL (ref 0–200)
HDL: 38 mg/dL — ABNORMAL LOW (ref >39)
Total CHOL/HDL Ratio: 3.8 Ratio
Triglycerides: 68 mg/dL (ref <150)

## 2012-06-19 LAB — HEPATIC FUNCTION PANEL
ALT: 24 U/L (ref 0–35)
AST: 27 U/L (ref 0–37)
Albumin: 4.4 g/dL (ref 3.5–5.2)
Alkaline Phosphatase: 53 U/L (ref 39–117)
Total Bilirubin: 0.7 mg/dL (ref 0.3–1.2)
Total Protein: 8.1 g/dL (ref 6.0–8.3)

## 2012-06-19 MED ORDER — DOXYCYCLINE HYCLATE 100 MG PO TABS: 100.0000 mg | ORAL_TABLET | Freq: Every day | ORAL | Status: DC

## 2012-06-19 MED ORDER — IBUPROFEN 600 MG PO TABS: 600.0000 mg | ORAL_TABLET | Freq: Four times a day (QID) | ORAL | Status: DC | PRN

## 2012-06-19 NOTE — Progress Notes (Signed)
Subjective:   HPI  Lynn Tucker is a 22 y.o. female who presents for f/u on chronic issues.   She is compliant with her diabetes and cholesterol medication.  Checking glucose, getting 90-100 readings in the mornings.  Not exercising.   She has a new job at [employer], but is eating healthy, has loss some weight.   She is taking seasonique for PCOS and associated symptoms.   Her main c/o is ongoing bumps in her armpits, tender, draining.  They do improve while on the antibiotics.  No c/o.     Reviewed their medical, surgical, family, social, medication, and allergy history and updated chart as appropriate.  Past Medical History  Diagnosis Date  . Sickle cell trait   . VSD (ventricular septal defect)     followed by cardiology every 2 years  . Obesity   . ADHD (attention deficit hyperactivity disorder)   . Diabetes mellitus   . Hyperlipidemia   . Wears glasses     Past Surgical History  Procedure Laterality Date  . Wisdom tooth extraction      Family History  Problem Relation Age of Onset  . Cancer Other   . Hyperlipidemia Other   . Stroke Other   . Heart attack Other   . Diabetes Other   . Hypertension Mother   . Sickle cell trait Father   . Heart disease Neg Hx     History   Social History  . Marital Status: Single    Spouse Name: N/A    Number of Children: N/A  . Years of Education: N/A   Occupational History  . Not on file.   Social History Main Topics  . Smoking status: Never Smoker   . Smokeless tobacco: Never Used  . Alcohol Use: No  . Drug Use: No  . Sexually Active: No   Other Topics Concern  . 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    Current Outpatient Prescriptions on File Prior to Visit  Medication Sig Dispense Refill  . glucose blood test strip Use as instructed one touch ultra  100 each  prn  . Levonorgestrel-Ethinyl  Estradiol (SEASONIQUE) 0.15-0.03 &0.01 MG tablet Take 1 tablet by mouth daily.  1 Package  4  . metFORMIN (GLUCOPHAGE) 500 MG tablet Take 1 tablet (500 mg total) by mouth 2 (two) times daily with a meal. 1 tablet po BID for diabetes  60 tablet  5  . pravastatin (PRAVACHOL) 40 MG tablet Take 1 tablet (40 mg total) by mouth daily.  30 tablet  5   No current facility-administered medications on file prior to visit.    No Known Allergies  ROS as in HPI     Objective:   Physical Exam  Filed Vitals:   06/19/12 1448  BP: 110/60  Pulse: 72  Temp: 98.1 F (36.7 C)  Resp: 16    General appearance: alert, no distress, WD/WN, AA female, obese Axilla bilat with multiple indurated tender nodular lesions, some drainage, no fluctuance Heart: RRR, normal S1, S2, no murmurs Lungs: CTA bilaterally, no wheezes, rhonchi, or rales   Assessment and Plan :    Encounter Diagnoses  Name Primary?  . Type II or unspecified type diabetes mellitus without mention of complication, not stated as uncontrolled Yes  . Hyperlipidemia   . Hidradenitis axillaris   . PCOS (polycystic ovarian  syndrome)       DM type II - labs today, check glucose daily, c/t current medication, congratulated her on the weight loss and lifestyle changes.   Hyperlipidemia - c/t current mediation, labs today  Hidradenitis - begin once daily Doxycycline.  Recheck 63mo.    PCOS - c/t OCPs.

## 2012-06-19 NOTE — Patient Instructions (Signed)
Begin Hibiclens wash OTC to armpits daily for a week, then once weekly.  continue regularly hygiene.   Avoid deodorant when possible.  Begin Doxycyline daily for a month.  Recheck in 1 month.  Hidradenitis Suppurativa, Sweat Gland Abscess Hidradenitis suppurativa is a long lasting (chronic), uncommon disease of the sweat glands. With this, boil-like lumps and scarring develop in the groin, some times under the arms (axillae), and under the breasts. It may also uncommonly occur behind the ears, in the crease of the buttocks, and around the genitals.  CAUSES  The cause is from a blocking of the sweat glands. They then become infected. It may cause drainage and odor. It is not contagious. So it cannot be given to someone else. It most often shows up in puberty (about 19 to 21 years of age). But it may happen much later. It is similar to acne which is a disease of the sweat glands. This condition is slightly more common in African-Americans and women. SYMPTOMS   Hidradenitis usually starts as one or more red, tender, swellings in the groin or under the arms (axilla).  Over a period of hours to days the lesions get larger. They often open to the skin surface, draining clear to yellow-colored fluid.  The infected area heals with scarring. DIAGNOSIS  Your caregiver makes this diagnosis by looking at you. Sometimes cultures (growing germs on plates in the lab) may be taken. This is to see what germ (bacterium) is causing the infection.  TREATMENT   Topical germ killing medicine applied to the skin (antibiotics) are the treatment of choice. Antibiotics taken by mouth (systemic) are sometimes needed when the condition is getting worse or is severe.  Avoid tight-fitting clothing which traps moisture in.  Dirt does not cause hidradenitis and it is not caused by poor hygiene.  Involved areas should be cleaned daily using an antibacterial soap. Some patients find that the liquid form of Lever 2000,  applied to the involved areas as a lotion after bathing, can help reduce the odor related to this condition.  Sometimes surgery is needed to drain infected areas or remove scarred tissue. Removal of large amounts of tissue is used only in severe cases.  Birth control pills may be helpful.  Oral retinoids (vitamin A derivatives) for 6 to 12 months which are effective for acne may also help this condition.  Weight loss will improve but not cure hidradenitis. It is made worse by being overweight. But the condition is not caused by being overweight.  This condition is more common in people who have had acne.  It may become worse under stress. There is no medical cure for hidradenitis. It can be controlled, but not cured. The condition usually continues for years with periods of getting worse and getting better (remission). Document Released: 11/09/2003 Document Revised: 06/19/2011 Document Reviewed: 11/25/2007 [facility] Patient Information 2013 [place], [place].

## 2012-06-20 ENCOUNTER — Telehealth: Payer: Self-pay | Admitting: Family Medicine

## 2012-06-20 ENCOUNTER — Other Ambulatory Visit: Payer: Self-pay | Admitting: Family Medicine

## 2012-06-20 LAB — HEMOGLOBIN A1C: Mean Plasma Glucose: 154 mg/dL — ABNORMAL HIGH (ref <117)

## 2012-06-20 MED ORDER — GLUCOSE BLOOD VI STRP: ORAL_STRIP | Status: DC

## 2012-06-20 NOTE — Telephone Encounter (Signed)
Test strips refills was sent to the pharmacy. CLS

## 2012-06-20 NOTE — Telephone Encounter (Signed)
Message copied by Janeice Robinson on Thu Jun 20, 2012 10:48 AM ------      Message from: Jac Canavan      Created: Wed Jun 19, 2012  9:57 PM       pls call in glucometer test strips ------

## 2012-06-21 ENCOUNTER — Telehealth: Payer: Self-pay | Admitting: Medical

## 2012-06-24 ENCOUNTER — Other Ambulatory Visit: Payer: Self-pay | Admitting: Medical

## 2012-06-24 MED ORDER — ATORVASTATIN CALCIUM 40 MG PO TABS: 40.0000 mg | ORAL_TABLET | Freq: Every day | ORAL | Status: DC

## 2012-06-24 NOTE — Telephone Encounter (Signed)
I'd like to change to Lipitor instead of Pravachol.  i think this would work better on hier cholesterol.   It should be cheap, but should work better to lower her cholesterol. She can finish out what she has left of Pravachol then switch to once daily lipitor.   Lets recheck cholesterol/fasting labs in 22mo.

## 2012-06-25 NOTE — Telephone Encounter (Signed)
Patient is aware of the change in her medication to Lipitor and to follow up in 3  Months fasting. CLS

## 2012-08-26 ENCOUNTER — Telehealth: Payer: Self-pay | Admitting: Medical

## 2012-08-26 NOTE — Telephone Encounter (Signed)
LMOM TO CB. CLS 

## 2012-08-26 NOTE — Telephone Encounter (Signed)
Patient would like to know if you want to start her back on the seasonique BCP? CLS  Patient is aware of Crosby Oyster PA-C message. CLS

## 2012-08-26 NOTE — Telephone Encounter (Signed)
Yes it is typically to not have a regular period with this type of OCP.  If there is any concern for pregnacy, can come in for urine pregnancy, but I think she is not sexually active.

## 2012-08-27 ENCOUNTER — Other Ambulatory Visit: Payer: Self-pay | Admitting: Family Medicine

## 2012-08-27 MED ORDER — LEVONORGEST-ETH ESTRAD 91-DAY 0.15-0.03 &0.01 MG PO TABS: 1.0000 | ORAL_TABLET | Freq: Every day | ORAL | Status: DC

## 2012-08-27 NOTE — Telephone Encounter (Signed)
I'm confused, is she not already taking it or have been taking it?  We started this a few months ago due to suspected ovarian cysts giver her associated symptoms.  So yes, c/t taking for now unless she is having side effects that I am not aware of.   Breakthrough bleeding from time to time is possible.

## 2012-08-27 NOTE — Telephone Encounter (Signed)
Patient is aware and refills was sent to her pharmacy. CLS

## 2012-09-18 ENCOUNTER — Other Ambulatory Visit (INDEPENDENT_AMBULATORY_CARE_PROVIDER_SITE_OTHER): Payer: 59

## 2012-09-18 DIAGNOSIS — Z111 Encounter for screening for respiratory tuberculosis: Secondary | ICD-10-CM

## 2012-09-18 DIAGNOSIS — Z139 Encounter for screening, unspecified: Secondary | ICD-10-CM

## 2012-09-24 ENCOUNTER — Encounter: Payer: Self-pay | Admitting: Family Medicine

## 2012-12-11 ENCOUNTER — Other Ambulatory Visit (INDEPENDENT_AMBULATORY_CARE_PROVIDER_SITE_OTHER): Payer: 59

## 2012-12-11 DIAGNOSIS — Z111 Encounter for screening for respiratory tuberculosis: Secondary | ICD-10-CM

## 2013-01-21 ENCOUNTER — Encounter: Payer: Self-pay | Admitting: Medical

## 2013-01-21 ENCOUNTER — Ambulatory Visit (INDEPENDENT_AMBULATORY_CARE_PROVIDER_SITE_OTHER): Payer: 59 | Admitting: Medical

## 2013-01-21 VITALS — BP 118/68 | HR 86 | Temp 98.3°F | Resp 16 | Wt 176.0 lb

## 2013-01-21 DIAGNOSIS — H109 Unspecified conjunctivitis: Secondary | ICD-10-CM

## 2013-01-21 DIAGNOSIS — J069 Acute upper respiratory infection, unspecified: Secondary | ICD-10-CM

## 2013-01-21 MED ORDER — POLYMYXIN B-TRIMETHOPRIM 10000-0.1 UNIT/ML-% OP SOLN: 1.0000 [drp] | Freq: Four times a day (QID) | OPHTHALMIC | Status: DC

## 2013-01-21 NOTE — Progress Notes (Signed)
Subjective:    Lynn Tucker is a 22 y.o. female who presents for evaluation of discharge, erythema and itching in the left eye. She has noticed the above symptoms for 2 days. Onset was acute. Patient denies blurred vision, foreign body sensation, pain, photophobia and visual field deficit.  She also has a two-day history of nasal congestion, runny nose, slight sore throat, and cough started as she arrived at our clinic today. Thinks she may be getting a cold. Denies sick contacts.  The following portions of the patient's history were reviewed and updated as appropriate: allergies, current medications, past family history, past medical history, past social history, past surgical history and problem list.  Review of System As in subjective   Objective:    BP 118/68  Pulse 86  Temp(Src) 98.3 F (36.8 C) (Oral)  Resp 16  Wt 176 lb (79.833 kg)  BMI 33.79 kg/m2       General: Well-developed, well-nourished, no acute distress. HEENT: Nares patent, slight turbinate edema, clear discharge, sinuses nontender, TMs pearly, pharynx normal Lungs: Clear Eyes: Left conjunctiva injected moderately suggestive of infectious conjunctivitis, some crusting noted, otherwise no swelling, PERRLA, EOM intact , right eye normal exam   Assessment:   Encounter Diagnoses  Name Primary?  . Conjunctivitis Yes  . Upper respiratory infection      Plan:   Conjunctivitis-  Discussed the diagnosis and proper care of conjunctivitis.  Stressed household Presenter, broadcasting. Ophthalmic drops per orders. Warm compress to eye(s). Local eye care discussed.  Gave note for work  URI-discussed supportive care  Call or return if either situation worsens or doesn't improve within the next 5-7 days

## 2013-01-31 ENCOUNTER — Ambulatory Visit: Payer: Self-pay | Admitting: Medical

## 2013-07-15 ENCOUNTER — Ambulatory Visit (INDEPENDENT_AMBULATORY_CARE_PROVIDER_SITE_OTHER): Payer: 59 | Admitting: Medical

## 2013-07-15 ENCOUNTER — Encounter: Payer: Self-pay | Admitting: Medical

## 2013-07-15 VITALS — BP 108/60 | HR 80 | Temp 98.2°F | Wt 183.0 lb

## 2013-07-15 DIAGNOSIS — F3289 Other specified depressive episodes: Secondary | ICD-10-CM

## 2013-07-15 DIAGNOSIS — R4586 Emotional lability: Secondary | ICD-10-CM

## 2013-07-15 DIAGNOSIS — E1165 Type 2 diabetes mellitus with hyperglycemia: Principal | ICD-10-CM

## 2013-07-15 DIAGNOSIS — F39 Unspecified mood [affective] disorder: Secondary | ICD-10-CM

## 2013-07-15 DIAGNOSIS — F329 Major depressive disorder, single episode, unspecified: Secondary | ICD-10-CM

## 2013-07-15 DIAGNOSIS — E785 Hyperlipidemia, unspecified: Secondary | ICD-10-CM

## 2013-07-15 DIAGNOSIS — IMO0001 Reserved for inherently not codable concepts without codable children: Secondary | ICD-10-CM

## 2013-07-15 DIAGNOSIS — R4589 Other symptoms and signs involving emotional state: Secondary | ICD-10-CM

## 2013-07-15 LAB — HEMOGLOBIN A1C
HEMOGLOBIN A1C: 6.2 % — AB (ref <5.7)
Mean Plasma Glucose: 131 mg/dL — ABNORMAL HIGH (ref <117)

## 2013-07-15 LAB — COMPREHENSIVE METABOLIC PANEL
ALK PHOS: 43 U/L (ref 39–117)
ALT: 17 U/L (ref 0–35)
AST: 21 U/L (ref 0–37)
Albumin: 4.4 g/dL (ref 3.5–5.2)
BUN: 8 mg/dL (ref 6–23)
CALCIUM: 9.9 mg/dL (ref 8.4–10.5)
CO2: 24 mEq/L (ref 19–32)
CREATININE: 0.8 mg/dL (ref 0.50–1.10)
Chloride: 100 mEq/L (ref 96–112)
GLUCOSE: 79 mg/dL (ref 70–99)
Potassium: 4 mEq/L (ref 3.5–5.3)
Sodium: 135 mEq/L (ref 135–145)
Total Bilirubin: 0.7 mg/dL (ref 0.2–1.2)
Total Protein: 7.9 g/dL (ref 6.0–8.3)

## 2013-07-15 LAB — CBC
HEMATOCRIT: 35.2 % — AB (ref 36.0–46.0)
Hemoglobin: 12.7 g/dL (ref 12.0–15.0)
MCH: 28.2 pg (ref 26.0–34.0)
MCHC: 36.1 g/dL — ABNORMAL HIGH (ref 30.0–36.0)
MCV: 78 fL (ref 78.0–100.0)
PLATELETS: 404 10*3/uL — AB (ref 150–400)
RBC: 4.51 MIL/uL (ref 3.87–5.11)
RDW: 13.6 % (ref 11.5–15.5)
WBC: 7 10*3/uL (ref 4.0–10.5)

## 2013-07-15 LAB — LIPID PANEL
CHOL/HDL RATIO: 3.2 ratio
Cholesterol: 175 mg/dL (ref 0–200)
HDL: 54 mg/dL (ref >39)
LDL CALC: 107 mg/dL — AB (ref 0–99)
TRIGLYCERIDES: 71 mg/dL (ref <150)
VLDL: 14 mg/dL (ref 0–40)

## 2013-07-15 LAB — TSH: TSH: 1.762 u[IU]/mL (ref 0.350–4.500)

## 2013-07-15 NOTE — Patient Instructions (Signed)
[facility] [phone] office [email or web address] [address], [place], [place], [place] [address]  Rema Fendt, therapist  Toy Cookey, MA, clinical psychologist  Cognitive-Behavior Therapy; Mood Disorders; Anxiety Disorders; adult and child ADHD; Family Therapy; Stress Management; personal growth, and Marital Therapy.    Terrance Mass Ph.D., clinical psychologist Cognitive-Behavior Therapy; Mood Disorders; Anxiety Disorders; Stress     Management   [facility] [address] [place], [place] [address] [phone]   [facility], psychotherapist [address] [place], [place] [address] [phone]   Karin Golden Ph.D., clinical psychologist [phone] office [place], [place] [address] Cognitive Behavior Therapy, Depression, Bipolar, Anxiety, Grief and Loss   Depression, Adult Depression refers to feeling sad, low, down in the dumps, blue, gloomy, or empty. In general, there are two kinds of depression: 1. Depression that we all experience from time to time because of upsetting life experiences, including the loss of a job or the ending of a relationship (normal sadness or normal grief). This kind of depression is considered normal, is short lived, and resolves within a few days to 2 weeks. (Depression experienced after the loss of a loved one is called bereavement. Bereavement often lasts longer than 2 weeks but normally gets better with time.) 2. Clinical depression, which lasts longer than normal sadness or normal grief or interferes with your ability to function at home, at work, and in school. It also interferes with your personal relationships. It affects almost every aspect of your life. Clinical depression is an illness. Symptoms of depression also can be caused by conditions other than normal sadness and grief or clinical depression.  Examples of these conditions are listed as follows:  Physical illness Some physical illnesses, including underactive thyroid gland (hypothyroidism), severe anemia, specific types of cancer, diabetes, uncontrolled seizures, heart and lung problems, strokes, and chronic pain are commonly associated with symptoms of depression.  Side effects of some prescription medicine In some people, certain types of prescription medicine can cause symptoms of depression.  Substance abuse Abuse of alcohol and illicit drugs can cause symptoms of depression. SYMPTOMS Symptoms of normal sadness and normal grief include the following:  Feeling sad or crying for short periods of time.  Not caring about anything (apathy).  Difficulty sleeping or sleeping too much.  No longer able to enjoy the things you used to enjoy.  Desire to be by oneself all the time (social isolation).  Lack of energy or motivation.  Difficulty concentrating or remembering.  Change in appetite or weight.  Restlessness or agitation. Symptoms of clinical depression include the same symptoms of normal sadness or normal grief and also the following symptoms:  Feeling sad or crying all the time.  Feelings of guilt or worthlessness.  Feelings of hopelessness or helplessness.  Thoughts of suicide or the desire to harm yourself (suicidal ideation).  Loss of touch with reality (psychotic symptoms). Seeing or hearing things that are not real (hallucinations) or having false beliefs about your life or the people around you (delusions and paranoia). DIAGNOSIS  The diagnosis of clinical depression usually is based on the severity and duration of the symptoms. Your caregiver also will ask you questions about your medical history and substance use to find out if physical illness, use of prescription medicine, or substance abuse is causing your depression. Your caregiver also may order blood tests. TREATMENT  Typically, normal sadness and  normal grief do not require treatment. However,  sometimes antidepressant medicine is prescribed for bereavement to ease the depressive symptoms until they resolve. The treatment for clinical depression depends on the severity of your symptoms but typically includes antidepressant medicine, counseling with a mental health professional, or a combination of both. Your caregiver will help to determine what treatment is best for you. Depression caused by physical illness usually goes away with appropriate medical treatment of the illness. If prescription medicine is causing depression, talk with your caregiver about stopping the medicine, decreasing the dose, or substituting another medicine. Depression caused by abuse of alcohol or illicit drugs abuse goes away with abstinence from these substances. Some adults need professional help in order to stop drinking or using drugs. SEEK IMMEDIATE CARE IF:  You have thoughts about hurting yourself or others.  You lose touch with reality (have psychotic symptoms).  You are taking medicine for depression and have a serious side effect. FOR MORE INFORMATION National Alliance on Mental Illness: www.nami.[employer] of Mental Health: https://carter.com/ Document Released: 03/24/2000 Document Revised: 09/26/2011 Document Reviewed: 06/26/2011 [facility] Patient Information 2014 [place].

## 2013-07-15 NOTE — Progress Notes (Signed)
   Subjective:   Lynn Tucker is a 23 y.o. female presenting on 07/15/2013 with medication check  Here for routine f/u on diabetes, lipids, obesity.  Last visit for same was 06/2012.   She notes that in the last 5-6 mo she stopped taking her medication for no particular reason.  She is taking her contraception though for PCOS.    She notes that she is down in her mood.  Lately not so motivated.  She is not happy that she still lives at home, doesn't have any major goals, no real vision for the future.   Sleep is so so.   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   No thoughts of SI/HI.  Not depressed all the time.   No overly worrisome about things.  She states she is eating healthy, getting some exercise.  No other aggravating or relieving factors.  No other complaint.  Review of Systems ROS as in subjective      Objective:     Filed Vitals:   07/15/13 1444  BP: 108/60  Pulse: 80  Temp: 98.2 F (36.8 C)    General appearance: alert, no distress, WD/WN Neck: supple, no lymphadenopathy, no thyromegaly, no masses Heart: RRR, normal S1, S2, no murmurs Lungs: CTA bilaterally, no wheezes, rhonchi, or rales Abdomen: +bs, soft, non tender, non distended, no masses, no hepatomegaly, no splenomegaly Pulses: 2+ symmetric, upper and lower extremities, normal cap refill      Assessment: Encounter Diagnoses  Name Primary?  . Type II or unspecified type diabetes mellitus without mention of complication, uncontrolled Yes  . Hyperlipidemia   . Mood change   . Depressed mood      Plan: Discussed her concerns, noncompliance, personal goals, frustrations.  Fasting labs today.   I recommended we get her back on track with her chronic conditions, but focused the visit on counseling about her mood, frustration,  lack of desired or motivation.  Recommended counseling ASAP.  Gave list of counselors.  Discussed the positive things she has going - better job, benefits, better pain, new nice car (the car of her dreams), little other debt, lives at home so low bills.  Discussed goal setting, budget, balance in life.   F/u pending labs, call back within 1 wk.  Lynn Tucker was seen today for medication check.  Diagnoses and associated orders for this visit:  Type II or unspecified type diabetes mellitus without mention of complication, uncontrolled - Hemoglobin A1c - Comprehensive metabolic panel - Lipid panel - Microalbumin / creatinine urine ratio - CBC - TSH  Hyperlipidemia - Lipid panel  Mood change - CBC - TSH  Depressed mood - CBC - TSH    Return pending labs.

## 2013-07-16 ENCOUNTER — Other Ambulatory Visit: Payer: Self-pay | Admitting: Medical

## 2013-07-16 ENCOUNTER — Telehealth: Payer: Self-pay | Admitting: Family Medicine

## 2013-07-16 LAB — MICROALBUMIN / CREATININE URINE RATIO
Creatinine, Urine: 122.9 mg/dL
MICROALB UR: 1.21 mg/dL (ref 0.00–1.89)
Microalb Creat Ratio: 9.8 mg/g (ref 0.0–30.0)

## 2013-07-16 MED ORDER — METFORMIN HCL 500 MG PO TABS: 500.0000 mg | ORAL_TABLET | Freq: Every day | ORAL | Status: DC

## 2013-07-16 NOTE — Telephone Encounter (Signed)
Metformin instructions is 1 tablet BID with meals to clarifiy with Alisha at [facility]

## 2013-07-16 NOTE — Telephone Encounter (Signed)
Pharmacy is aware and [facility] sent a new Rx. CLS

## 2013-10-05 ENCOUNTER — Other Ambulatory Visit: Payer: Self-pay | Admitting: Medical

## 2014-02-05 ENCOUNTER — Other Ambulatory Visit: Payer: Self-pay | Admitting: Medical

## 2014-02-05 NOTE — Telephone Encounter (Signed)
Ok to RF? 

## 2014-02-20 ENCOUNTER — Encounter: Payer: Self-pay | Admitting: Medical

## 2014-02-20 ENCOUNTER — Ambulatory Visit (INDEPENDENT_AMBULATORY_CARE_PROVIDER_SITE_OTHER): Payer: 59 | Admitting: Medical

## 2014-02-20 ENCOUNTER — Telehealth: Payer: Self-pay | Admitting: Medical

## 2014-02-20 VITALS — BP 110/78 | HR 78 | Temp 98.5°F | Wt 181.0 lb

## 2014-02-20 DIAGNOSIS — L732 Hidradenitis suppurativa: Secondary | ICD-10-CM

## 2014-02-20 DIAGNOSIS — E119 Type 2 diabetes mellitus without complications: Secondary | ICD-10-CM

## 2014-02-20 DIAGNOSIS — E669 Obesity, unspecified: Secondary | ICD-10-CM

## 2014-02-20 DIAGNOSIS — E282 Polycystic ovarian syndrome: Secondary | ICD-10-CM

## 2014-02-20 MED ORDER — METFORMIN HCL 500 MG PO TABS: 500.0000 mg | ORAL_TABLET | Freq: Every day | ORAL | Status: DC

## 2014-02-20 MED ORDER — LEVONORGEST-ETH ESTRAD 91-DAY 0.15-0.03 &0.01 MG PO TABS: 1.0000 | ORAL_TABLET | Freq: Every day | ORAL | Status: DC

## 2014-02-20 NOTE — Progress Notes (Signed)
  Subjective:   Lynn Tucker is an 23 y.o. female who presents for follow up of Type 2 diabetes mellitus, obesity, PCOS.   Patient is checking home blood sugars.   Home blood sugar records: under 130, sometimes a little on the low side without symptoms Current symptoms include: none. Patient denies none.  Patient is checking their feet daily. Foot concerns (callous, ulcer, wound, thickened nails, toenail fungus, skin fungus, hammer toe): none  Last dilated eye exam 06/2013  Current treatments: none. Medication compliance: good  Current diet: in general, an "unhealthy" diet Current exercise: none Known diabetic complications: none  PCOS - on OCPs without c/o.  No prior sexual activity.  Last pelvic ultrasound a few years ago.  The following portions of the patient's history were reviewed and updated as appropriate: allergies, current medications, past family history, past medical history, past social history, past surgical history and problem list.  ROS as in subjective above    Objective:   Filed Vitals:   02/20/14 1420  BP: 110/78  Pulse: 78  Temp: 98.5 F (36.9 C)   General appearance: alert, no distress, WD/WN, AA female  Neck: supple, no lymphadenopathy, no thyromegaly, no masses Heart: RRR, normal S1, S2, no murmurs Lungs: CTA bilaterally, no wheezes, rhonchi, or rales Abdomen: +bs, soft, non tender, non distended, no masses, no hepatomegaly, no splenomegaly Pulses: 2+ symmetric, upper and lower extremities, normal cap refill Ext: no edema Skin: no excess body hair, bilat axilla with several tender lumps, 1 small fluctuance lump left axilla   Assessment:   Encounter Diagnoses  Name Primary?  . Diabetes type 2, controlled Yes  . Hidradenitis axillaris   . PCOS (polycystic ovarian syndrome)   . Obesity      Plan:   Diabetes type 2 - HgbA1C at goal, c/t Metformin once daily, healthier diet and need to exercise more.  F/u in March for physical,  labs  Hidradenitis - referral to general surgery for recurrent hidradenitis of bilat axilla  PCOS - c/t Metformin, OCP, referral to gynecology for further management and routine gynecological exam  Obesity - c/t efforts at weight loss through diet and exercise

## 2014-02-20 NOTE — Telephone Encounter (Signed)
Refer to general surgery for hidradenitis axilaris  Refer to gynecology for routine gyn care and PCOS/secondary amenorrhea.  Send last 2-3 OV notes from last 1.5 year including labs, visits for PCOS.

## 2014-02-21 MED ORDER — DOXYCYCLINE HYCLATE 100 MG PO TABS: 100.0000 mg | ORAL_TABLET | Freq: Two times a day (BID) | ORAL | Status: DC

## 2014-02-21 NOTE — Addendum Note (Signed)
Addended by: Carlena Hurl on: 02/21/2014 08:02 AM   Modules accepted: Orders

## 2014-02-24 ENCOUNTER — Other Ambulatory Visit: Payer: Self-pay | Admitting: Family Medicine

## 2014-02-24 DIAGNOSIS — N912 Amenorrhea, unspecified: Secondary | ICD-10-CM

## 2014-02-24 DIAGNOSIS — E282 Polycystic ovarian syndrome: Secondary | ICD-10-CM

## 2014-02-24 NOTE — Telephone Encounter (Signed)
I fax over her referral to [facility] surgery for the general surgeon referral they will contact her for the appointment.  I place orders in [facility] for the GYN referral to Dr. Ammie Ferrier office and her office will contact this patient

## 2014-03-03 ENCOUNTER — Other Ambulatory Visit: Payer: Self-pay | Admitting: Family Medicine

## 2014-03-03 DIAGNOSIS — L732 Hidradenitis suppurativa: Secondary | ICD-10-CM

## 2014-03-12 ENCOUNTER — Telehealth: Payer: Self-pay | Admitting: Obstetrics & Gynecology

## 2014-03-12 NOTE — Telephone Encounter (Signed)
lmtcb to schedule new patient doctor referral

## 2014-03-16 ENCOUNTER — Ambulatory Visit (INDEPENDENT_AMBULATORY_CARE_PROVIDER_SITE_OTHER): Payer: Self-pay | Admitting: Surgery

## 2014-03-16 NOTE — H&P (Signed)
History of Present Illness Lynn Tucker. [place] Urieta MD; 03/16/2014 12:33 PM) Patient words: hidradenitis axilaris.  The patient is a 23 year old female who presents with a complaint of Hidradenitis. 23 yo female referred by Chana Bode, PA-C for evaluation of axillary hidradenitis. The patient has diabetes and has had firm masses in both axillae. The left side seems to be more problematic than the right. She has never had surgical drainage on either side, but has had several courses of antibiotics and some spontaneous drainage. She has two areas in the left axilla that are tender, but only seems to be draining. She presents now to discuss excision.   Other Problems Briant Cedar, CMA; 03/16/2014 10:03 AM) Diabetes Mellitus Heart murmur  Past Surgical History Briant Cedar, [place]; 03/16/2014 10:03 AM) Oral Surgery  Diagnostic Studies History Briant Cedar, [place]; 03/16/2014 10:03 AM) Colonoscopy never Mammogram never Pap Smear never  Allergies Briant Cedar, CMA; 03/16/2014 10:05 AM) No Known Drug Allergies12/10/2013  Medication History Briant Cedar, CMA; 03/16/2014 10:05 AM) Minta Balsam (0.15-0.03 &0.01MG  Tablet, Oral) Active. Doxycycline Hyclate (100MG  Tablet, Oral) Active. MetFORMIN HCl (500MG  Tablet, Oral) Active.  Social History Briant Cedar, [place]; 03/16/2014 10:03 AM) Alcohol use Occasional alcohol use. Caffeine use Carbonated beverages, Coffee, Tea.  Family History Briant Cedar, [place]; 03/16/2014 10:03 AM) Diabetes Mellitus Family Members In General, Father, Mother. Hypertension Mother.  Pregnancy / Birth History Briant Cedar, CMA; 03/16/2014 10:03 AM) Age at menarche 59 years. Contraceptive History Oral contraceptives. Gravida 0 Irregular periods Para 0  Review of Systems Briant Cedar CMA; 03/16/2014 10:03 AM) General Present- Appetite Loss. Not Present- Chills, Fatigue, Fever, Night Sweats, Weight Gain and Weight Loss. Skin Present-  New Lesions and Ulcer. Not Present- Change in Wart/Mole, Dryness, Hives, Jaundice, Non-Healing Wounds and Rash. HEENT Not Present- Earache, Hearing Loss, Hoarseness, Nose Bleed, Oral Ulcers, Ringing in the Ears, Seasonal Allergies, Sinus Pain, Sore Throat, Visual Disturbances, Wears glasses/contact lenses and Yellow Eyes. Respiratory Not Present- Bloody sputum, Chronic Cough, Difficulty Breathing, Snoring and Wheezing. Breast Not Present- Breast Mass, Breast Pain, Nipple Discharge and Skin Changes. Cardiovascular Not Present- Chest Pain, Difficulty Breathing Lying Down, Leg Cramps, Palpitations, Rapid Heart Rate, Shortness of Breath and Swelling of Extremities. Gastrointestinal Not Present- Abdominal Pain, Bloating, Bloody Stool, Change in Bowel Habits, Chronic diarrhea, Constipation, Difficulty Swallowing, Excessive gas, Gets full quickly at meals, Hemorrhoids, Indigestion, Nausea, Rectal Pain and Vomiting. Female Genitourinary Not Present- Frequency, Nocturia, Painful Urination, Pelvic Pain and Urgency. Musculoskeletal Not Present- Back Pain, Joint Pain, Joint Stiffness, Muscle Pain, Muscle Weakness and Swelling of Extremities. Neurological Not Present- Decreased Memory, Fainting, Headaches, Numbness, Seizures, Tingling, Tremor, Trouble walking and Weakness. Psychiatric Not Present- Anxiety, Bipolar, Change in Sleep Pattern, Depression, Fearful and Frequent crying. Endocrine Present- New Diabetes. Not Present- Cold Intolerance, Excessive Hunger, Hair Changes, Heat Intolerance and Hot flashes. Hematology Not Present- Easy Bruising, Excessive bleeding, Gland problems, HIV and Persistent Infections.   Vitals Briant Cedar CMA; 03/16/2014 10:06 AM) 03/16/2014 10:05 AM Weight: 181 lb Height: 60in Body Surface Area: 1.86 m Body Mass Index: 35.35 kg/m Temp.: 98.63F  Pulse: 75 (Regular)  BP: 118/62 (Sitting, Left Arm, Standard)    Physical Exam Rodman Key K. Yaniv Lage MD; 03/16/2014 12:34  PM) The physical exam findings are as follows: Note:WDWN in NAD HEENT: EOMI, sclera anicteric Neck: No masses, no thyromegaly Lungs: CTA bilaterally; normal respiratory effort Right axilla - small amount of old scarring; no palpable fluctuant masses Left axilla - two separate 2 cm areas of palpable tender mass, but no  overlying induration; some old scarring. One of the masses has a punctate opening with minimal drainage CV: Regular rate and rhythm; no murmurs Abd: +bowel sounds, soft, non-tender, no masses Ext: Well-perfused; no edema Skin: Warm, dry; no sign of jaundice    Assessment & Plan Rodman Key K. Torsten Weniger MD; 03/16/2014 10:26 AM) HIDRADENITIS SUPPURATIVA OF LEFT AXILLA (705.83  L73.2) Current Plans  Schedule for Surgery - Excision of hidradenitis of the left axilla - The surgical procedure has been discussed with the patient. Potential risks, benefits, alternative treatments, and expected outcomes have been explained. All of the patient's questions at this time have been answered. The likelihood of reaching the patient's treatment goal is good. The patient understand the proposed surgical procedure and wishes to proceed.  Lynn Tucker. Georgette Dover, MD, [facility]  General/ Trauma Surgery  03/16/2014 12:35 PM

## 2014-03-23 ENCOUNTER — Ambulatory Visit (INDEPENDENT_AMBULATORY_CARE_PROVIDER_SITE_OTHER): Payer: 59 | Admitting: Medical

## 2014-03-23 ENCOUNTER — Encounter: Payer: Self-pay | Admitting: Medical

## 2014-03-23 VITALS — BP 130/78 | HR 72 | Temp 98.9°F | Resp 16 | Wt 183.0 lb

## 2014-03-23 DIAGNOSIS — G43A0 Cyclical vomiting, not intractable: Secondary | ICD-10-CM

## 2014-03-23 DIAGNOSIS — R1115 Cyclical vomiting syndrome unrelated to migraine: Secondary | ICD-10-CM

## 2014-03-23 DIAGNOSIS — R109 Unspecified abdominal pain: Secondary | ICD-10-CM

## 2014-03-23 DIAGNOSIS — R1012 Left upper quadrant pain: Secondary | ICD-10-CM

## 2014-03-23 LAB — POCT URINALYSIS DIPSTICK
BILIRUBIN UA: NEGATIVE
Blood, UA: NEGATIVE
GLUCOSE UA: NEGATIVE
Ketones, UA: NEGATIVE
Leukocytes, UA: NEGATIVE
NITRITE UA: NEGATIVE
PH UA: 7
Protein, UA: NEGATIVE
Spec Grav, UA: 1.02
Urobilinogen, UA: NEGATIVE

## 2014-03-23 LAB — POCT URINE PREGNANCY: Preg Test, Ur: NEGATIVE

## 2014-03-23 MED ORDER — DEXLANSOPRAZOLE 60 MG PO CPDR: 60.0000 mg | DELAYED_RELEASE_CAPSULE | Freq: Every day | ORAL | Status: DC

## 2014-03-23 MED ORDER — ONDANSETRON HCL 4 MG PO TABS: 4.0000 mg | ORAL_TABLET | Freq: Three times a day (TID) | ORAL | Status: DC | PRN

## 2014-03-23 NOTE — Progress Notes (Signed)
Subjective: Here for left side pain.  Was on vacation recently to celebrate her birthday.  Drank a few shots here and there.   Ate at random times, variety of different foods compared to normal.  She notes the last 2 weeks having some nausea, upper middle and left abdominal pain.   Pain wraps around the side a little.  Had 1 episode of vomiting . The most she drank at a given time was 2 shots.  Didn't get drunk.   No change in BMs, has BMs daily.   No urinary c/o.  She did have sex recently with condoms but one time the condom broke.  Has irregular periods, but she didn't have a recent menstrual period.  Not sure if the belly cramping is from this.  Denies fever, no vaginal c/o, no back pain, no SOB, cough, URI symptoms or chest pain.  No other aggravating or relieving factors. No other complaint.  ROS as in subjective  Objective: Filed Vitals:   03/23/14 1516  BP: 130/78  Pulse: 72  Temp: 98.9 F (37.2 C)  Resp: 16    General appearance: alert, no distress, WD/WN  HEENT: normocephalic, sclerae anicteric, TMs pearly, nares patent, no discharge or erythema, pharynx normal Oral cavity: MMM, no lesions Neck: supple, no lymphadenopathy, no thyromegaly, no masses Heart: RRR, normal S1, S2, no murmurs Lungs: CTA bilaterally, no wheezes, rhonchi, or rales Abdomen: +bs, soft, mild epigastric tenderness, otherwise non tender, non distended, no masses, no hepatomegaly, no splenomegaly Pulses: 2+ symmetric, upper and lower extremities, normal cap refill   Assessment: Encounter Diagnoses  Name Primary?  . Abdominal pain, unspecified abdominal location Yes  . Left upper quadrant pain   . Non-intractable cyclical vomiting with nausea    Plan: Discussed symptoms, concerns, recent diet changes.  UA and pregnancy negative/unremarkable.   Begin samples of Dexilant for possible dyspepsia vs GERD.  Low risk for pancreatitis.    zofran prn for nausea.  Avoid GERD trigger foods for now.   If not improving  or worse in the next few days, call/recheck.

## 2014-03-30 ENCOUNTER — Emergency Department (HOSPITAL_COMMUNITY)
Admission: EM | Admit: 2014-03-30 | Discharge: 2014-03-30 | Disposition: A | Discharge status: Home / Self Care | Payer: 59 | Attending: Emergency Medicine | Admitting: Emergency Medicine

## 2014-03-30 ENCOUNTER — Encounter (HOSPITAL_COMMUNITY): Payer: Self-pay

## 2014-03-30 ENCOUNTER — Emergency Department (HOSPITAL_COMMUNITY): Payer: 59

## 2014-03-30 DIAGNOSIS — Z8659 Personal history of other mental and behavioral disorders: Secondary | ICD-10-CM | POA: Diagnosis not present

## 2014-03-30 DIAGNOSIS — R0602 Shortness of breath: Secondary | ICD-10-CM | POA: Diagnosis not present

## 2014-03-30 DIAGNOSIS — Z3202 Encounter for pregnancy test, result negative: Secondary | ICD-10-CM | POA: Diagnosis not present

## 2014-03-30 DIAGNOSIS — R109 Unspecified abdominal pain: Secondary | ICD-10-CM | POA: Diagnosis present

## 2014-03-30 DIAGNOSIS — R110 Nausea: Secondary | ICD-10-CM | POA: Insufficient documentation

## 2014-03-30 DIAGNOSIS — E119 Type 2 diabetes mellitus without complications: Secondary | ICD-10-CM | POA: Insufficient documentation

## 2014-03-30 DIAGNOSIS — Z862 Personal history of diseases of the blood and blood-forming organs and certain disorders involving the immune mechanism: Secondary | ICD-10-CM | POA: Diagnosis not present

## 2014-03-30 DIAGNOSIS — Z79899 Other long term (current) drug therapy: Secondary | ICD-10-CM | POA: Diagnosis not present

## 2014-03-30 DIAGNOSIS — E669 Obesity, unspecified: Secondary | ICD-10-CM | POA: Insufficient documentation

## 2014-03-30 DIAGNOSIS — R1010 Upper abdominal pain, unspecified: Secondary | ICD-10-CM | POA: Insufficient documentation

## 2014-03-30 DIAGNOSIS — Z793 Long term (current) use of hormonal contraceptives: Secondary | ICD-10-CM | POA: Diagnosis not present

## 2014-03-30 DIAGNOSIS — Q210 Ventricular septal defect: Secondary | ICD-10-CM | POA: Insufficient documentation

## 2014-03-30 LAB — COMPREHENSIVE METABOLIC PANEL
ALT: 17 U/L (ref 0–35)
AST: 17 U/L (ref 0–37)
Albumin: 4.2 g/dL (ref 3.5–5.2)
Alkaline Phosphatase: 63 U/L (ref 39–117)
Anion gap: 14 (ref 5–15)
BUN: 6 mg/dL (ref 6–23)
CHLORIDE: 103 meq/L (ref 96–112)
CO2: 25 mEq/L (ref 19–32)
CREATININE: 0.87 mg/dL (ref 0.50–1.10)
Calcium: 10.2 mg/dL (ref 8.4–10.5)
GFR calc Af Amer: >90 mL/min (ref >90)
GFR calc non Af Amer: >90 mL/min (ref >90)
Glucose, Bld: 103 mg/dL — ABNORMAL HIGH (ref 70–99)
POTASSIUM: 4.1 meq/L (ref 3.7–5.3)
Sodium: 142 mEq/L (ref 137–147)
Total Bilirubin: 0.5 mg/dL (ref 0.3–1.2)
Total Protein: 8.5 g/dL — ABNORMAL HIGH (ref 6.0–8.3)

## 2014-03-30 LAB — CBC WITH DIFFERENTIAL/PLATELET
BASOS PCT: 1 % (ref 0–1)
Basophils Absolute: 0 10*3/uL (ref 0.0–0.1)
EOS ABS: 0.1 10*3/uL (ref 0.0–0.7)
Eosinophils Relative: 1 % (ref 0–5)
HCT: 39.5 % (ref 36.0–46.0)
Hemoglobin: 13.3 g/dL (ref 12.0–15.0)
LYMPHS ABS: 1.5 10*3/uL (ref 0.7–4.0)
Lymphocytes Relative: 23 % (ref 12–46)
MCH: 28.1 pg (ref 26.0–34.0)
MCHC: 33.7 g/dL (ref 30.0–36.0)
MCV: 83.3 fL (ref 78.0–100.0)
Monocytes Absolute: 0.5 10*3/uL (ref 0.1–1.0)
Monocytes Relative: 8 % (ref 3–12)
NEUTROS PCT: 67 % (ref 43–77)
Neutro Abs: 4.4 10*3/uL (ref 1.7–7.7)
PLATELETS: 379 10*3/uL (ref 150–400)
RBC: 4.74 MIL/uL (ref 3.87–5.11)
RDW: 13.1 % (ref 11.5–15.5)
WBC: 6.4 10*3/uL (ref 4.0–10.5)

## 2014-03-30 LAB — URINALYSIS, ROUTINE W REFLEX MICROSCOPIC
Bilirubin Urine: NEGATIVE
GLUCOSE, UA: NEGATIVE mg/dL
HGB URINE DIPSTICK: NEGATIVE
KETONES UR: NEGATIVE mg/dL
Leukocytes, UA: NEGATIVE
Nitrite: NEGATIVE
PROTEIN: NEGATIVE mg/dL
Specific Gravity, Urine: 1.019 (ref 1.005–1.030)
Urobilinogen, UA: 0.2 mg/dL (ref 0.0–1.0)
pH: 6 (ref 5.0–8.0)

## 2014-03-30 LAB — PREGNANCY, URINE: Preg Test, Ur: NEGATIVE

## 2014-03-30 LAB — LIPASE, BLOOD: Lipase: 23 U/L (ref 11–59)

## 2014-03-30 MED ORDER — OXYCODONE-ACETAMINOPHEN 5-325 MG PO TABS
1.0000 | ORAL_TABLET | Freq: Once | ORAL | Status: AC
  Administered 2014-03-30: 1 via ORAL
  Filled 2014-03-30: qty 1

## 2014-03-30 MED ORDER — TRAMADOL HCL 50 MG PO TABS: 50.0000 mg | ORAL_TABLET | Freq: Four times a day (QID) | ORAL | Status: DC | PRN

## 2014-03-30 MED ORDER — ONDANSETRON 4 MG PO TBDP
4.0000 mg | ORAL_TABLET | Freq: Once | ORAL | Status: AC
  Administered 2014-03-30: 4 mg via ORAL
  Filled 2014-03-30: qty 1

## 2014-03-30 NOTE — ED Notes (Signed)
MD at bedside. 

## 2014-03-30 NOTE — ED Provider Notes (Signed)
CSN: [id number]     Arrival date & time 03/30/14  0720 History   First MD Initiated Contact with Patient 03/30/14 0730     Chief Complaint  Patient presents with  . Abdominal Pain  . Shortness of Breath     (Consider location/radiation/quality/duration/timing/severity/associated sxs/prior Treatment) Patient is a 23 y.o. female presenting with abdominal pain and shortness of breath. The history is provided by the patient.  Abdominal Pain Associated symptoms: nausea and shortness of breath   Associated symptoms: no chest pain, no diarrhea and no vomiting   Shortness of Breath Associated symptoms: abdominal pain   Associated symptoms: no chest pain, no headaches, no rash and no vomiting    patient has had left upper abdominal pain that radiates to the back for the last 3 weeks. Was seen by her PCP and diagnosed with possible GERD. No relief with the medication. Has had continued pain. States is worse in the morning. She's had somewhat decreased oral intake but has not lost any weight. She states that there is been no fevers. No dysuria. She's had some loose stool. She's not had pain like this before. The pain is dull. It is constant. Some shortness of breath with the episodes. Past Medical History  Diagnosis Date  . Sickle cell trait   . VSD (ventricular septal defect)     followed by cardiology every 2 years  . Obesity   . ADHD (attention deficit hyperactivity disorder)   . Diabetes mellitus   . Hyperlipidemia   . Wears glasses    Past Surgical History  Procedure Laterality Date  . Wisdom tooth extraction     Family History  Problem Relation Age of Onset  . Cancer Other   . Hyperlipidemia Other   . Stroke Other   . Heart attack Other   . Diabetes Other   . Hypertension Mother   . Sickle cell trait Father   . Heart disease Neg Hx    History  Substance Use Topics  . Smoking status: Never Smoker   . Smokeless tobacco: Never Used  . Alcohol Use: Yes   OB History    No  data available     Review of Systems  Constitutional: Negative for activity change and appetite change.  Eyes: Negative for pain.  Respiratory: Positive for shortness of breath. Negative for chest tightness.   Cardiovascular: Negative for chest pain and leg swelling.  Gastrointestinal: Positive for nausea and abdominal pain. Negative for vomiting and diarrhea.  Genitourinary: Negative for flank pain.  Musculoskeletal: Negative for back pain and neck stiffness.  Skin: Negative for rash.  Neurological: Negative for weakness, numbness and headaches.  Psychiatric/Behavioral: Negative for behavioral problems.      Allergies  Review of patient's allergies indicates no known allergies.  Home Medications   Prior to Admission medications   Medication Sig Start Date End Date Taking? Authorizing Provider  dexlansoprazole (DEXILANT) 60 MG capsule Take 1 capsule (60 mg total) by mouth daily. 03/23/14  Yes Camelia Eng Tysinger, PA-C  Levonorgestrel-Ethinyl Estradiol (AMETHIA) 0.15-0.03 &0.01 MG tablet Take 1 tablet by mouth daily. 02/20/14  Yes Camelia Eng Tysinger, PA-C  metFORMIN (GLUCOPHAGE) 500 MG tablet Take 1 tablet (500 mg total) by mouth daily with breakfast. 1 tablet po daily for diabetes 02/20/14  Yes Camelia Eng Tysinger, PA-C  ondansetron (ZOFRAN) 4 MG tablet Take 1 tablet (4 mg total) by mouth every 8 (eight) hours as needed for nausea or vomiting. 03/23/14  Yes Carlena Hurl, PA-C  doxycycline (VIBRA-TABS) 100 MG tablet Take 1 tablet (100 mg total) by mouth 2 (two) times daily. 02/21/14   Camelia Eng Tysinger, PA-C  glucose blood test strip Use as instructed one touch ultra 06/20/12   Camelia Eng Tysinger, PA-C  traMADol (ULTRAM) 50 MG tablet Take 1 tablet (50 mg total) by mouth every 6 (six) hours as needed. 03/30/14   Jasper Riling. Emmilyn Crooke, MD   BP 125/62 mmHg  Pulse 58  Temp(Src) 98 F (36.7 C) (Oral)  Resp 20  Ht 5' (1.524 m)  Wt 185 lb (83.915 kg)  BMI 36.13 kg/m2  SpO2 99%  LMP  03/17/2014 Physical Exam  Constitutional: She is oriented to person, place, and time. She appears well-developed and well-nourished.  HENT:  Head: Normocephalic and atraumatic.  Neck: Neck supple. No thyromegaly present.  Cardiovascular: Normal rate and regular rhythm.   Pulmonary/Chest: Effort normal and breath sounds normal. No respiratory distress. She has no wheezes. She has no rales.  Abdominal: Soft. Bowel sounds are normal. She exhibits no distension and no mass. There is tenderness. There is no rebound and no guarding.  Mild left upper quadrant tenderness without rebound or guarding.  Musculoskeletal: Normal range of motion.  Neurological: She is alert and oriented to person, place, and time. No cranial nerve deficit.  Skin: Skin is warm and dry.  Psychiatric: She has a normal mood and affect. Her speech is normal.  Nursing note and vitals reviewed.   ED Course  Procedures (including critical care time) Labs Review Labs Reviewed  COMPREHENSIVE METABOLIC PANEL - Abnormal; Notable for the following:    Glucose, Bld 103 (*)    Total Protein 8.5 (*)    All other components within normal limits  CBC WITH DIFFERENTIAL  URINALYSIS, ROUTINE W REFLEX MICROSCOPIC  PREGNANCY, URINE  LIPASE, BLOOD    Imaging Review US Abdomen Complete  03/30/2014   CLINICAL DATA:  Upper abdominal pain, diabetes  EXAM: ULTRASOUND ABDOMEN COMPLETE  COMPARISON:  None.  FINDINGS: Gallbladder: No gallstones or wall thickening visualized. No sonographic Murphy sign noted.  Common bile duct: Diameter: 3 mm in diameter within normal limits.  Liver: No focal lesion identified. Within normal limits in parenchymal echogenicity.  IVC: No abnormality visualized.  Pancreas: Visualized portion unremarkable.  Spleen: Size and appearance within normal limits. Measures 7.6 cm in length.  Right Kidney: Length: 10 cm. Echogenicity within normal limits. No mass or hydronephrosis visualized.  Left Kidney: Length: 11.8 cm.  Echogenicity within normal limits. No mass or hydronephrosis visualized.  Abdominal aorta: No aneurysm visualized. Measures up to 1.7 cm in diameter.  Other findings: None.  IMPRESSION: 1. Normal abdominal ultrasound.   Electronically Signed   By: Lahoma Crocker M.D.   On: 03/30/2014 11:50     EKG Interpretation None      MDM   Final diagnoses:  Upper abdominal pain    Patient with upper abdominal pain. Has previously been diagnosed with GERD. Lab work reassuring. Mild tenderness. Ultrasound is negative. Lungs are clear. Unknown cause will discharge home to follow-up as needed.    Jasper Riling. Alvino Chapel, MD 03/31/14 [phone]

## 2014-03-30 NOTE — Discharge Instructions (Signed)

## 2014-03-30 NOTE — ED Notes (Signed)
Pt complains of three week hx of upper left abd/rib pain, and sob and lightheaded, has seen MD for same and given acid reflux and no change with medications.

## 2014-04-07 ENCOUNTER — Ambulatory Visit (INDEPENDENT_AMBULATORY_CARE_PROVIDER_SITE_OTHER): Payer: 59 | Admitting: Medical

## 2014-04-07 ENCOUNTER — Telehealth: Payer: Self-pay | Admitting: Medical

## 2014-04-07 ENCOUNTER — Encounter: Payer: Self-pay | Admitting: Medical

## 2014-04-07 VITALS — BP 100/68 | HR 72 | Temp 98.3°F | Resp 16 | Wt 181.0 lb

## 2014-04-07 DIAGNOSIS — R1110 Vomiting, unspecified: Secondary | ICD-10-CM

## 2014-04-07 DIAGNOSIS — R1012 Left upper quadrant pain: Secondary | ICD-10-CM

## 2014-04-07 LAB — POCT URINALYSIS DIPSTICK
Bilirubin, UA: NEGATIVE
Blood, UA: NEGATIVE
GLUCOSE UA: NEGATIVE
Ketones, UA: NEGATIVE
Leukocytes, UA: NEGATIVE
NITRITE UA: NEGATIVE
PROTEIN UA: NEGATIVE
Spec Grav, UA: 1.025
UROBILINOGEN UA: 1
pH, UA: 6

## 2014-04-07 MED ORDER — PROMETHAZINE HCL 25 MG PO TABS: 25.0000 mg | ORAL_TABLET | Freq: Three times a day (TID) | ORAL | Status: DC | PRN

## 2014-04-07 MED ORDER — SUCRALFATE 1 GM/10ML PO SUSP: 1.0000 g | Freq: Three times a day (TID) | ORAL | Status: DC

## 2014-04-07 MED ORDER — PANTOPRAZOLE SODIUM 40 MG PO TBEC: DELAYED_RELEASE_TABLET | ORAL | Status: DC

## 2014-04-07 MED ORDER — ONDANSETRON HCL 4 MG PO TABS: 4.0000 mg | ORAL_TABLET | Freq: Three times a day (TID) | ORAL | Status: DC | PRN

## 2014-04-07 MED ORDER — TRAMADOL HCL 50 MG PO TABS: 50.0000 mg | ORAL_TABLET | Freq: Four times a day (QID) | ORAL | Status: DC | PRN

## 2014-04-07 NOTE — Patient Instructions (Addendum)
Encounter Diagnoses  Name Primary?  . Intractable vomiting with nausea, vomiting of unspecified type Yes  . Abdominal pain, left upper quadrant    Recommendations:  Continue either Zofran or Phenergan for nausea and vomiting  phenergan can cause drowsiness, so use caution  Begin Pantoprazole to block acid, once daily in the morning  Begin new medication called Sucralfate to coat the stomach.  Take this about 1 hour before meals  Continue Ultram as needed for pain  We will call with the H pylori test and referral info to gastroenterology

## 2014-04-07 NOTE — Addendum Note (Signed)
Addended by: Carlena Hurl on: 04/07/2014 09:01 AM   Modules accepted: Orders

## 2014-04-07 NOTE — Progress Notes (Signed)
Subjective: Here for f/u on nausea and abdominal pain.  Is tired, stomach still hurts, body aches.   Still having similar symptoms since last visit.   Pain seems to be LUQ and central abdomen.  dexilant didn't seem to help.   The symptoms are worse in the morning, awakes her out of her sleep.   Throughout the day has nausea, but only mild pain.   Worse pain is in the morning.   No prior belly surgery.  Using tramadol for pain.  Has been using zofran for nausea.  BM soft, but no major changes.   BM daily.   No urinary changes other than small amounts in her opinion.  Feeling chills.  No recent alcohol use.  No other aggravating or relieving factors. No other complaint.  ROS as in subjective  Objective: Filed Vitals:   04/07/14 0828  BP: 100/68  Pulse: 72  Temp: 98.3 F (36.8 C)  Resp: 16    General appearance: alert, no distress, WD/WN  HEENT: normocephalic, sclerae anicteric, TMs pearly, nares patent, no discharge or erythema, pharynx normal Oral cavity: MMM, no lesions Neck: supple, no lymphadenopathy, no thyromegaly, no masses Heart: RRR, normal S1, S2, no murmurs Lungs: CTA bilaterally, no wheezes, rhonchi, or rales Abdomen: +bs, soft, mild epigastric tenderness, otherwise non tender, non distended, no masses, no hepatomegaly, no splenomegaly Pulses: 2+ symmetric, upper and lower extremities, normal cap refill   Assessment: Encounter Diagnoses  Name Primary?  . Intractable vomiting with nausea, vomiting of unspecified type Yes  . Abdominal pain, left upper quadrant    Plan: Discussed symptoms, concerns, recent 4 visits for same without improvement on therapy.  reviewd the most recent ED visit and labs.  Patient Instructions   Encounter Diagnoses  Name Primary?  . Intractable vomiting with nausea, vomiting of unspecified type Yes  . Abdominal pain, left upper quadrant    Recommendations:  Continue either Zofran or Phenergan for nausea and vomiting  phenergan can cause  drowsiness, so use caution  Begin Pantoprazole to block acid, once daily in the morning  Begin new medication called Sucralfate to coat the stomach.  Take this about 1 hour before meals  Continue Ultram as needed for pain  We will call with the H pylori test and referral info to gastroenterology

## 2014-04-07 NOTE — Telephone Encounter (Signed)
I left a message on her voicemail about her appointment  DR. HUNG [address] [place], [place] [phone]   04/13/2014 @  830 AM WITH DR. Benson [place]

## 2014-04-07 NOTE — Telephone Encounter (Signed)
Refer to GI 

## 2014-04-08 ENCOUNTER — Other Ambulatory Visit: Payer: Self-pay | Admitting: Medical

## 2014-04-08 MED ORDER — CLARITHROMYCIN 500 MG PO TABS: 500.0000 mg | ORAL_TABLET | Freq: Two times a day (BID) | ORAL | Status: DC

## 2014-04-08 MED ORDER — AMOXICILLIN 500 MG PO TABS: ORAL_TABLET | ORAL | Status: DC

## 2014-04-09 LAB — HELICOBACTER PYLORI ABS-IGG+IGA, BLD
H Pylori IgG: 2.86 {ISR} — ABNORMAL HIGH
HELICOBACTER PYLORI AB, IGA: 3 U/mL (ref <9.0)

## 2014-04-10 DIAGNOSIS — A048 Other specified bacterial intestinal infections: Secondary | ICD-10-CM

## 2014-04-10 HISTORY — DX: Other specified bacterial intestinal infections: A04.8

## 2014-04-30 ENCOUNTER — Ambulatory Visit: Payer: 59 | Admitting: Obstetrics & Gynecology

## 2014-05-14 ENCOUNTER — Encounter: Payer: Self-pay | Admitting: Obstetrics & Gynecology

## 2014-05-14 ENCOUNTER — Ambulatory Visit (INDEPENDENT_AMBULATORY_CARE_PROVIDER_SITE_OTHER): Payer: 59 | Admitting: Obstetrics & Gynecology

## 2014-05-14 VITALS — BP 108/62 | HR 68 | Resp 16 | Ht 61.0 in | Wt 176.8 lb

## 2014-05-14 DIAGNOSIS — Z202 Contact with and (suspected) exposure to infections with a predominantly sexual mode of transmission: Secondary | ICD-10-CM

## 2014-05-14 DIAGNOSIS — Z124 Encounter for screening for malignant neoplasm of cervix: Secondary | ICD-10-CM

## 2014-05-14 DIAGNOSIS — Z0000 Encounter for general adult medical examination without abnormal findings: Secondary | ICD-10-CM

## 2014-05-14 DIAGNOSIS — Z01419 Encounter for gynecological examination (general) (routine) without abnormal findings: Secondary | ICD-10-CM

## 2014-05-14 LAB — POCT URINALYSIS DIPSTICK
Bilirubin, UA: NEGATIVE
Glucose, UA: NEGATIVE
KETONES UA: NEGATIVE
LEUKOCYTES UA: NEGATIVE
Nitrite, UA: NEGATIVE
PROTEIN UA: NEGATIVE
UROBILINOGEN UA: NEGATIVE
pH, UA: 5

## 2014-05-14 MED ORDER — NORETHIN ACE-ETH ESTRAD-FE 1-20 MG-MCG PO TABS: 1.0000 | ORAL_TABLET | Freq: Every day | ORAL | Status: DC

## 2014-05-14 NOTE — Addendum Note (Signed)
Addended by: Robley Fries on: 05/14/2014 11:38 AM   Modules accepted: Orders

## 2014-05-14 NOTE — Progress Notes (Signed)
24 y.o. G0P0000 SingleAfrican AmericanF here for new patient visit.  Pt reports she has been on some sort of cycle control for the last three years but stopped a few months ago.  Has always been on the same pill, Amethia.  Pt reports cycle are usually around every 12 weeks with this pill but flow lasts for five days.  Three days are heavy--using overnight pads and changes every 4 hours.  Never soils clothes.  Doesn't use tampons.  In last few months, off OCPs, cycles are regular and monthly but flow is about the same as it was when she was using a 90 day pill pack.  She should like this to improve.  Pt not contemplating pregnancy but does say it it happened it would be ok.  + SA with partner for about 5 months.  Has known him longer.  He is 26 and has no children.  No LMP recorded.          Sexually active: Yes.    The current method of family planning is condoms all of the time.    Exercising: No.  not regularly Smoker:  Former social smoker  Health Maintenance: Pap:  never History of abnormal Pap:  no MMG:  none Colonoscopy:  none BMD:   none TDaP:  2009 Screening Labs: not needed, Hb today: not needed, Urine today: neg   reports that she has quit smoking. She has never used smokeless tobacco. She reports that she drinks about 0.6 oz of alcohol per week. She reports that she does not use illicit drugs.  Past Medical History  Diagnosis Date  . Sickle cell trait   . VSD (ventricular septal defect)     followed by cardiology every 2 years  . Obesity   . ADHD (attention deficit hyperactivity disorder)   . Diabetes mellitus   . Hyperlipidemia   . Wears glasses   . H. pylori infection 1/16    off antibiotics at this time    Past Surgical History  Procedure Laterality Date  . Wisdom tooth extraction      Current Outpatient Prescriptions  Medication Sig Dispense Refill  . glucose blood test strip Use as instructed one touch ultra 100 each prn  . metFORMIN (GLUCOPHAGE) 500 MG  tablet Take 1 tablet (500 mg total) by mouth daily with breakfast. 1 tablet po daily for diabetes 30 tablet 3  . traMADol (ULTRAM) 50 MG tablet Take 1 tablet (50 mg total) by mouth every 6 (six) hours as needed. 20 tablet 0  . amoxicillin (AMOXIL) 500 MG tablet 2 tablets po BID x 10 days (Patient not taking: Reported on 05/14/2014) 40 tablet 0  . clarithromycin (BIAXIN) 500 MG tablet Take 1 tablet (500 mg total) by mouth 2 (two) times daily. (Patient not taking: Reported on 05/14/2014) 20 tablet 0  . Levonorgestrel-Ethinyl Estradiol (AMETHIA) 0.15-0.03 &0.01 MG tablet Take 1 tablet by mouth daily. (Patient not taking: Reported on 05/14/2014) 91 tablet 0   No current facility-administered medications for this visit.    Family History  Problem Relation Age of Onset  . Cancer Other     maternal side-? type  . Hyperlipidemia Other     ? side  . Stroke Other     paternal side  . Diabetes Other     paternal and maternal side  . Hypertension Mother   . Sickle cell trait Father   . Thyroid disease Paternal Aunt     ROS:  Pertinent items are noted  in HPI.  Otherwise, a comprehensive ROS was negative.  Exam:   General appearance: alert, cooperative and appears stated age Head: Normocephalic, without obvious abnormality, atraumatic Neck: no adenopathy, supple, symmetrical, trachea midline and thyroid normal to inspection and palpation Lungs: clear to auscultation bilaterally Breasts: normal appearance, no masses or tenderness Heart: regular rate and rhythm Abdomen: soft, non-tender; bowel sounds normal; no masses,  no organomegaly Extremities: extremities normal, atraumatic, no cyanosis or edema Skin: Skin color, texture, turgor normal. No rashes or lesions Lymph nodes: Cervical, supraclavicular, and axillary nodes normal. No abnormal inguinal nodes palpated Neurologic: Grossly normal   Pelvic: External genitalia:  no lesions              Urethra:  normal appearing urethra with no masses,  tenderness or lesions              Bartholins and Skenes: normal                 Vagina: normal appearing vagina with normal color and discharge, no lesions              Cervix: no lesions              Pap taken: Yes.   Bimanual Exam:  Uterus:  normal size, contour, position, consistency, mobility, non-tender              Adnexa: normal adnexa and no mass, fullness, tenderness               Rectovaginal: Confirms               Anus:  normal sphincter tone, no lesions  Chaperone was present for exam.  A:  Well Woman with normal exam H/O menorrhagia.  Normal exam today. Sexually active Diabetes.  hba1C 6.2 on Metformin.  P:   Mammogram screening discussed and appropriate age for initiation discussed.  SBE taught pap smear obtained today GC/Chl pending Pt will start on Loestrin 1/20 for now and not restart 90 day OCP method.  Pt knows to call if has any irregular bleeding or for follow up if cycles aren't a lot lighter/shorter.  For now, with normal exam, do not think PUS will add anything at this time.  OCP risks of DVT/PE/elevated BP/nausea and headache discussed.  As pt was on higher estrogen pill, feel she will most likely due fine with change in pills.  Information on IUD use, depo Provera, Nexplanon provided as well. Return annually or prn

## 2014-05-15 LAB — IPS N GONORRHOEA AND CHLAMYDIA BY PCR

## 2014-05-15 LAB — IPS PAP TEST WITH REFLEX TO HPV

## 2014-05-21 ENCOUNTER — Telehealth: Payer: Self-pay

## 2014-05-21 NOTE — Telephone Encounter (Signed)
-----   Message from Lyman Speller, MD sent at 05/18/2014  2:35 PM EST ----- 02 recall.  Inform GC/Chl were negative as well.  Pt needs to let us know when she decides to start her OCPs as I will want to see her back for a three month recheck.  She was considering delaying starting them for a few months as her cycles were more "normal" over the last few months.

## 2014-05-21 NOTE — Telephone Encounter (Signed)
Lmtcb//kn 

## 2014-06-23 ENCOUNTER — Ambulatory Visit: Payer: 59 | Admitting: Medical

## 2014-07-16 NOTE — Telephone Encounter (Signed)
Patient notified of all results. See result note.//kn

## 2014-08-05 ENCOUNTER — Encounter: Payer: Self-pay | Admitting: Medical

## 2014-08-05 ENCOUNTER — Ambulatory Visit (INDEPENDENT_AMBULATORY_CARE_PROVIDER_SITE_OTHER): Payer: 59 | Admitting: Medical

## 2014-08-05 VITALS — BP 100/68 | HR 71 | Temp 98.3°F | Wt 171.0 lb

## 2014-08-05 DIAGNOSIS — E119 Type 2 diabetes mellitus without complications: Secondary | ICD-10-CM

## 2014-08-05 DIAGNOSIS — F39 Unspecified mood [affective] disorder: Secondary | ICD-10-CM | POA: Diagnosis not present

## 2014-08-05 DIAGNOSIS — R454 Irritability and anger: Secondary | ICD-10-CM

## 2014-08-05 DIAGNOSIS — F411 Generalized anxiety disorder: Secondary | ICD-10-CM

## 2014-08-05 DIAGNOSIS — R4586 Emotional lability: Secondary | ICD-10-CM

## 2014-08-05 LAB — CBC
HCT: 37.9 % (ref 36.0–46.0)
Hemoglobin: 12.6 g/dL (ref 12.0–15.0)
MCH: 28.1 pg (ref 26.0–34.0)
MCHC: 33.2 g/dL (ref 30.0–36.0)
MCV: 84.6 fL (ref 78.0–100.0)
MPV: 11.3 fL (ref 8.6–12.4)
PLATELETS: 386 10*3/uL (ref 150–400)
RBC: 4.48 MIL/uL (ref 3.87–5.11)
RDW: 13.5 % (ref 11.5–15.5)
WBC: 5.7 10*3/uL (ref 4.0–10.5)

## 2014-08-05 LAB — POCT URINALYSIS DIPSTICK
Bilirubin, UA: NEGATIVE
GLUCOSE UA: NEGATIVE
Ketones, UA: NEGATIVE
Leukocytes, UA: NEGATIVE
NITRITE UA: NEGATIVE
Protein, UA: NEGATIVE
RBC UA: NEGATIVE
Spec Grav, UA: 1.025
UROBILINOGEN UA: 0.2
pH, UA: 6

## 2014-08-05 LAB — BASIC METABOLIC PANEL
BUN: 7 mg/dL (ref 6–23)
CHLORIDE: 102 meq/L (ref 96–112)
CO2: 27 mEq/L (ref 19–32)
Calcium: 10.1 mg/dL (ref 8.4–10.5)
Creat: 0.88 mg/dL (ref 0.50–1.10)
GLUCOSE: 92 mg/dL (ref 70–99)
POTASSIUM: 3.7 meq/L (ref 3.5–5.3)
Sodium: 140 mEq/L (ref 135–145)

## 2014-08-05 LAB — TSH: TSH: 1.342 u[IU]/mL (ref 0.350–4.500)

## 2014-08-05 LAB — POCT URINE PREGNANCY: Preg Test, Ur: NEGATIVE

## 2014-08-05 NOTE — Progress Notes (Signed)
Subjective: Here for feeling nervous, anxiety.   Gets nauseated in the morning.  Feeling this all day, started feeling this about a month ago.  Working full time at [employer].  She is working more hours of late.  Working 10 hours - 12 hours daily.   Living with sister, been there a while.  No other new changes in her life.   Sleeping consistently , no issues with sleep.  eating mostly healthy but gets out of job late, around 10:30pm so sometimes doesn't eat great late at night.  Exercise - occasionally goes to the gym.   Drinking red bull every now and then, drinks mostly water and juice.   Last year had a period when she felt depressed, crying spells.   Lately she thinks others can tell she is dealing with anxiety and this magnifies the problem.  Not checking glucose.  No other aggravating or relieving factors. No other complaint.   Past Medical History  Diagnosis Date  . Sickle cell trait   . VSD (ventricular septal defect)     followed by cardiology every 2 years  . Obesity   . ADHD (attention deficit hyperactivity disorder)   . Diabetes mellitus   . Hyperlipidemia   . Wears glasses   . H. pylori infection 1/16    off antibiotics at this time   ROS as in subjective  Objective: BP 100/68 mmHg  Pulse 71  Temp(Src) 98.3 F (36.8 C) (Oral)  Wt 171 lb (77.565 kg)  Wt Readings from Last 3 Encounters:  08/05/14 171 lb (77.565 kg)  05/14/14 176 lb 12.8 oz (80.196 kg)  04/07/14 181 lb (82.101 kg)    General appearance: alert, no distress, WD/WN, AA female Oral cavity: MMM, no lesions Neck: supple, no lymphadenopathy, no thyromegaly, no masses Heart: 2/6 brief systolic murmur heard best in upper sternal borders bilat, otherwise RRR, normal S1, S2 Lungs: CTA bilaterally, no wheezes, rhonchi, or rales Abdomen: +bs, soft, non tender, non distended, no masses, no hepatomegaly, no splenomegaly Pulses: 2+ symmetric, upper and lower extremities, normal cap refill Ext: no  edema Neuro: nonfocal exam    Assessment: Encounter Diagnoses  Name Primary?  Marland Kitchen Anxiety state Yes  . Mood change   . Diabetes type 2, controlled   . Irritability     Plan: PHQ-9 questionnaire with score of 15 today.  Mood disorder questionnaire today equivocal.  UA reviewed, Urine pregnancy negative.  Labs today.   Discussed concerns, possible causes.  Consider counseling, pending labs consider citalopram as a trial if labs ok.

## 2014-08-06 ENCOUNTER — Other Ambulatory Visit: Payer: Self-pay | Admitting: Medical

## 2014-08-06 MED ORDER — CITALOPRAM HYDROBROMIDE 20 MG PO TABS: 20.0000 mg | ORAL_TABLET | Freq: Every day | ORAL | Status: DC

## 2014-10-02 ENCOUNTER — Telehealth: Payer: Self-pay | Admitting: Obstetrics & Gynecology

## 2014-10-02 NOTE — Telephone Encounter (Signed)
Left message for patient letting her know she can pick up her rx & the [facility] says there is no problem there with her getting it.

## 2014-10-02 NOTE — Telephone Encounter (Signed)
Patient wants to get a new prescription for her birth control. She never picked up the prescription from 05/14/14 visit and the pharmacy told her she had to call her physician.

## 2015-01-21 ENCOUNTER — Ambulatory Visit (INDEPENDENT_AMBULATORY_CARE_PROVIDER_SITE_OTHER): Payer: Commercial Managed Care - PPO | Admitting: Medical

## 2015-01-21 ENCOUNTER — Encounter: Payer: Self-pay | Admitting: Medical

## 2015-01-21 VITALS — BP 100/52 | HR 75 | Temp 98.4°F | Wt 165.0 lb

## 2015-01-21 DIAGNOSIS — J069 Acute upper respiratory infection, unspecified: Secondary | ICD-10-CM

## 2015-01-21 MED ORDER — DM-PHENYLEPHRINE-ACETAMINOPHEN 10-5-325 MG PO TABS: 1.0000 | ORAL_TABLET | Freq: Four times a day (QID) | ORAL | Status: DC

## 2015-01-21 NOTE — Progress Notes (Signed)
  Subjective:  Lynn Tucker is a 24 y.o. female who presents for respiratory illness.   Symptoms include 4 day hx/o  chills, fever, headache, nasal congestion, rhinorrhea, sore throat and body aches, mild cough. Denies N/V/D. Using Tylenol, some benadryl for symptoms.  reports sick contacts.  Past history is significant for diabetes.   Patient is a smoker. No other aggravating or relieving factors.  No other c/o.  Past Medical History  Diagnosis Date  . Sickle cell trait ([place])   . VSD (ventricular septal defect)     followed by cardiology every 2 years  . Obesity   . ADHD (attention deficit hyperactivity disorder)   . Diabetes mellitus   . Hyperlipidemia   . Wears glasses   . H. pylori infection 1/16    off antibiotics at this time    ROS as in subjective   Objective: BP 100/52 mmHg  Pulse 75  Temp(Src) 98.4 F (36.9 C)  Wt 165 lb (74.844 kg)  SpO2 96%  LMP 01/05/2015  General appearance: alert, no distress, WD/WN, mildly ill appearing HEENT: normocephalic, sclerae anicteric, conjunctiva pink and moist, TMs pearly, nares patent, no discharge or erythema, pharynx normal, tonsils unremarkable Oral cavity: MMM, no lesions Neck: supple, no lymphadenopathy, no thyromegaly, no masses Heart: RRR, normal S1, S2, no murmurs Lungs: CTA bilaterally, no wheezes, rhonchi, or rales Pulses: 2+ symmetric       Assessment  Encounter Diagnosis  Name Primary?  . Acute upper respiratory infection Yes      Plan: Discussed symptoms, exam, supportive care for URI.  Specific home care recommendations today include:  Only take over-the-counter (OTC) or prescription medicines for pain, discomfort, or fever as directed by your caregiver.    Decongestant: You may use OTC Guaifenesin (Mucinex plain) for congestion.  You may use Pseudoephedrine (Sudafed) only if you don't have blood pressure problems or a diagnosis of hypertension.  Cough suppression: If you have cough from  drainage, you may use over-the-counter Dextromethorphan (Delsym) as directed on the label  Sore throat remedies:  You may use salt water gargles, warm fluids such as coffee or hot tea, or honey/tea/lemon mixture to sooth sore throat pain.  You may use OTC sore throat remedies such as Cepacol lozenges or Chloraseptic spray for sore throat pain.  Runny nose and sneezing remedies: You may use OTC antihistamine such as Zyrtec or Benadryl, but caution as these can cause drowsiness.    Pain/fever relief: You may use over-the-counter Tylenol for pain or fever  Drink extra fluids. Fluids help thin the mucus so your sinuses can drain more easily.   Applying either moist heat or ice packs to the sinus areas may help relieve discomfort.  Use saline nasal sprays to help moisten your sinuses. The sprays can be found at your local drugstore.   Patient was advised to call or return if worse or not improving in the next few days.    Patient voiced understanding of diagnosis, recommendations, and treatment plan.

## 2015-07-12 ENCOUNTER — Telehealth: Payer: Self-pay | Admitting: Obstetrics & Gynecology

## 2015-07-12 NOTE — Telephone Encounter (Signed)
Patient has an aex appointment 07/16/2015 with Dr.Miller. Patient took a OTC pregnancy test over the weekend with positive results.

## 2015-07-12 NOTE — Telephone Encounter (Signed)
Spoke with patient. Patient states that her LMP was on 06/27/2015. She took a pregnancy test over the weekend which was positive. Has an aex appointment scheduled for 07/16/2015. Advised we can change this appointment to a pregnancy confirmation appointment if she would like. She is agreeable. Appointment on 07/16/2015 at 10:45 am with Dr.Miller changed to pregnancy confirmation. She is agreeable and verbalizes understanding.  Routing to provider for final review. Patient agreeable to disposition. Will close encounter.

## 2015-07-12 NOTE — Telephone Encounter (Signed)
Left message to call Haiden Rawlinson at [phone]. 

## 2015-07-16 ENCOUNTER — Encounter: Payer: Self-pay | Admitting: Obstetrics & Gynecology

## 2015-07-16 ENCOUNTER — Telehealth: Payer: Self-pay

## 2015-07-16 ENCOUNTER — Telehealth: Payer: Self-pay | Admitting: Obstetrics & Gynecology

## 2015-07-16 ENCOUNTER — Ambulatory Visit (INDEPENDENT_AMBULATORY_CARE_PROVIDER_SITE_OTHER): Payer: Commercial Managed Care - PPO | Admitting: Obstetrics & Gynecology

## 2015-07-16 VITALS — BP 120/66 | HR 82 | Resp 16 | Ht 61.0 in | Wt 183.0 lb

## 2015-07-16 DIAGNOSIS — N912 Amenorrhea, unspecified: Secondary | ICD-10-CM

## 2015-07-16 DIAGNOSIS — E119 Type 2 diabetes mellitus without complications: Secondary | ICD-10-CM

## 2015-07-16 LAB — HEMOGLOBIN A1C
Hgb A1c MFr Bld: 6.1 % — ABNORMAL HIGH (ref <5.7)
Mean Plasma Glucose: 128 mg/dL

## 2015-07-16 LAB — POCT URINE PREGNANCY: Preg Test, Ur: POSITIVE — AB

## 2015-07-16 MED ORDER — METFORMIN HCL 500 MG PO TABS: 500.0000 mg | ORAL_TABLET | Freq: Two times a day (BID) | ORAL | Status: DC

## 2015-07-16 NOTE — Telephone Encounter (Signed)
Shu with [facility] calling to get the frequency of how patient should use glucometer rx that was written for patient. Dr. Sabra Heck please advise directions on how patient should use this glucometer for insurance purposes. [facility]: [phone]

## 2015-07-16 NOTE — Telephone Encounter (Signed)
Spoke with the pharmacist at [facility]. Advised of direction for Metformin 500 mg as seen below from Lillian. He is agreeable and will make this change.  Routing to provider for final review. Patient agreeable to disposition. Will close encounter.

## 2015-07-16 NOTE — Progress Notes (Signed)
GYNECOLOGY  VISIT   HPI: 25 y.o. G0P0000 Single African American female with positive UPT at home here for confirmation of pregnancy.  Pt's LMP was 05/30/15 which makes her 6 4/[redacted] weeks gestation.  She is not completely sure about this dating, however.  By that LMP, [facility] 02/25/16.    Having breast tenderness and low back pain.  She denies nausea.  She has no vaginal bleeding or pelvic pain.    Pt with hx of diabetes but has not been taking her medication.  Will need to restart, ASAP.  Not on PNV.  Advised needs to start this immediately as well.  Voices understanding.    Pt with hx of VSD with murmur.  Has not seen cardiology in several years.  Will likely need this as well.    No cats in the home.  D/w pt CMV risk.  Tdap 2009.  She is aware this will be update in pregnancy.  Had chicken pox vaccine, she thinks.  Aware flu vaccine safe and recommended.   Sickle cell testing discussed.  Pt nor partner has Sickle Cell in family and neither are carriers to best of their knowledge.  Last pap 2/16.  GYNECOLOGIC HISTORY: Patient's last menstrual period was 05/30/2015 (approximate).  Patient Active Problem List   Diagnosis Date Noted  . Type II or unspecified type diabetes mellitus without mention of complication, not stated as uncontrolled 02/08/2012  . Hyperlipidemia 02/08/2012  . Secondary amenorrhea 02/08/2012  . Hyperlipidemia LDL goal <70 07/24/2011  . Diabetes mellitus ([place]) 03/15/2011  . Obesity (BMI 30-39.9) 03/15/2011    Past Medical History  Diagnosis Date  . Sickle cell trait ([place])   . VSD (ventricular septal defect)     followed by cardiology every 2 years  . Obesity   . ADHD (attention deficit hyperactivity disorder)   . Diabetes mellitus   . Hyperlipidemia   . Wears glasses   . H. pylori infection 1/16    off antibiotics at this time    Past Surgical History  Procedure Laterality Date  . Wisdom tooth extraction      MEDS:  Reviewed in EPIC and UTD  ALLERGIES:  Review of patient's allergies indicates no known allergies.  Family History  Problem Relation Age of Onset  . Cancer Other     maternal side-? type  . Hyperlipidemia Other     ? side  . Stroke Other     paternal side  . Diabetes Other     paternal and maternal side  . Hypertension Mother   . Sickle cell trait Father   . Thyroid disease Paternal Aunt     SH:  Single, social smoker but states hasn't used in a while  Review of Systems  All other systems reviewed and are negative.   PHYSICAL EXAMINATION:    BP 120/66 mmHg  Pulse 82  Resp 16  Ht 5\' 1"  (1.549 m)  Wt 183 lb (83.008 kg)  BMI 34.60 kg/m2  LMP 05/30/2015 (Approximate)    General appearance: alert, cooperative and appears stated age Abdomen: soft, non-tender; bowel sounds normal; no masses,  no organomegaly  Pelvic: External genitalia:  no lesions              Urethra:  normal appearing urethra with no masses, tenderness or lesions              Bartholins and Skenes: normal  Vagina: normal appearing vagina with normal color and discharge, no lesions              Cervix: no lesions              Bimanual Exam:  Uterus:  Feels full and about 6 weeks size              Adnexa: no mass, fullness, tenderness              Rectovaginal: No..  Confirms.              Anus:  normal sphincter tone, no lesions  Bedside ultrasound was performed.  Only small gestational sac was noted.  No adnexal masses noted.  No free fluid noted.  Suspect dating is off.  Chaperone was present for exam.  Assessment: +UPT, early pregnancy with unsure dating Diabetes Social smoker, advised to STOP H/O VSD  Plan: ABO today HCG quant today.  Will need to repeat and plan PUS after. Restart Metformin 500mg  bid today Start PNV today No ETOH   ~30 minutes spent with patient >50% of time was in face to face discussion of above.

## 2015-07-16 NOTE — Telephone Encounter (Signed)
Spoke with the pharmacist at [facility]. Advised of directions for glucometer use. He is agreeable and will add this to the glucometer order.  Routing to provider for final review. Patient agreeable to disposition. Will close encounter.

## 2015-07-16 NOTE — Telephone Encounter (Signed)
Directions for glucometer should be :  check fasting blood sugar daily.

## 2015-07-16 NOTE — Telephone Encounter (Signed)
[facility] pharmacy is calling to verify directions for patients Metformin rx. Metformin 500 mg tablets directions state take 1 tablet by mouth 2 times daily, take 1 tablet po daily for diabetes.  Routing to [place] for review and advise.

## 2015-07-16 NOTE — Telephone Encounter (Signed)
The directions should read 1 po bid.  #60/2RF.  Thanks.

## 2015-07-17 LAB — ABO AND RH: Rh Type: POSITIVE

## 2015-07-17 LAB — HCG, QUANTITATIVE, PREGNANCY: hCG, Beta Chain, Quant, S: 2651.6 m[IU]/mL — ABNORMAL HIGH

## 2015-07-21 ENCOUNTER — Other Ambulatory Visit (INDEPENDENT_AMBULATORY_CARE_PROVIDER_SITE_OTHER): Payer: Commercial Managed Care - PPO

## 2015-07-21 ENCOUNTER — Encounter: Payer: Self-pay | Admitting: Obstetrics & Gynecology

## 2015-07-21 ENCOUNTER — Other Ambulatory Visit: Payer: Self-pay | Admitting: Obstetrics & Gynecology

## 2015-07-21 DIAGNOSIS — N912 Amenorrhea, unspecified: Secondary | ICD-10-CM

## 2015-07-21 NOTE — Addendum Note (Signed)
Addended by: Michele Mcalpine on: 07/21/2015 08:43 AM   Modules accepted: Orders

## 2015-07-22 ENCOUNTER — Telehealth: Payer: Self-pay | Admitting: *Deleted

## 2015-07-22 LAB — HCG, QUANTITATIVE, PREGNANCY: hCG, Beta Chain, Quant, S: 1924.7 m[IU]/mL — ABNORMAL HIGH

## 2015-07-22 NOTE — Telephone Encounter (Signed)
Spoke to cathy approximately 1000 this am when patient was at their practice to establish OB care. Confirmed previous BHCG level and repeat level from yesterday currently pending.   Quant BHCG result received and reviewed by Dr Sabra Heck. Report called to [facility] of decreasing BHCG. Tye [place] states ultrasound was performed there today and no evidence of IUP. She thinks BHCG was also drawn today but requests copy of our result be faxed to them @ [phone].  They will contact patient and continue to follow care. Quant HCG faxed.   Routing to provider for final review.

## 2015-07-27 ENCOUNTER — Telehealth: Payer: Self-pay | Admitting: Obstetrics & Gynecology

## 2015-07-27 NOTE — Telephone Encounter (Signed)
    Patient calling for lab results 

## 2015-07-27 NOTE — Telephone Encounter (Signed)
Routing to [place] for review and advise. HCG quant was performed on 07/21/2015. Patient established care with OB on 07/22/2015 who have been notified of results (see telephone call dated  07/22/2015).

## 2015-07-30 ENCOUNTER — Telehealth: Payer: Self-pay | Admitting: Obstetrics & Gynecology

## 2015-07-30 NOTE — Telephone Encounter (Signed)
Patient returned call.  I advised patient that our office faxed her results on 07/22/15 to [facility] as patient had established care with their office and results were to be discussed with her according to their office.   Patient states she did not receive any results from [facility] and was not aware of results. She was seen on 07/22/15 and had ultrasound but no blood drawn at their office.   She was advised to return to [facility] in two weeks (Scheduled for 08/02/15) for follow up ultrasound and potential blood work per patient. Patient states she called [facility] on Tuesday 07/27/15 to advise that she had began to have vaginal bleeding. She was advised to monitor bleeding and emergency precautions given. She states she also called their office again on 4/19 and spoke with the nurse again with further bleeding.  She is having vaginal bleeding and this time and is changing her pad q 4 hours. This is not heavy bleeding per patient. She has some intermittent pelvic cramps, she has not had any pain or discomfort. Denies feelings of weakness or dizziness.  She states that she has a follow up appointment at [facility] on Monday.  Discussed with patient that her vaginal bleeding and dropping hcg is consistent with a non-viable pregnancy and stressed importance of follow up with [facility] to ensure that pregnancy hormone levels go back to zero. Patient verbalizes understanding and is tearful. She is advised to call [facility] with any questions or if develops any heavy bleeding, pain, dizziness, or any other concerning symptom. Patient states she is aware of symptoms and was advised by nurse at [facility] to go to M[facility] Admissions Unit at [facility] if bleeding worsened.   Dr. Sabra Heck and Lamont Snowball, RN made aware of patient concerns.

## 2015-07-30 NOTE — Telephone Encounter (Signed)
Message left to return call to Madai Nuccio at [phone].    

## 2015-07-30 NOTE — Telephone Encounter (Signed)
Call from Seth Bake, on call nurse from [place]. She does not have access to patient chart information so is unable to provide any information to me. Advised Seth Bake of BHCG levels from our office and that patient called today with complaint of vaginal bleeding and to get lab results. Advised results were called to [facility] on 07-22-15 and patient's care was to be managed there.  Seth Bake will contact patient now for assessment.   Routing to provider for final review.

## 2015-07-30 NOTE — Telephone Encounter (Signed)
Patient is still waiting on a return call from 07/27/15. Patient said she is not able to see her lab results on MyChart.

## 2015-08-02 ENCOUNTER — Encounter (HOSPITAL_COMMUNITY): Payer: Self-pay | Admitting: *Deleted

## 2015-08-02 NOTE — Telephone Encounter (Signed)
Verbal report to Dr Sabra Heck on Friday 07-30-15 while contacting [facility].

## 2015-08-05 ENCOUNTER — Encounter (HOSPITAL_COMMUNITY)
Admission: RE | Disposition: A | Discharge status: Home / Self Care | Payer: Self-pay | Source: Ambulatory Visit | Attending: Obstetrics and Gynecology

## 2015-08-05 ENCOUNTER — Ambulatory Visit (HOSPITAL_COMMUNITY)
Admission: RE | Admit: 2015-08-05 | Discharge: 2015-08-05 | Disposition: A | Discharge status: Home / Self Care | Payer: Commercial Managed Care - PPO | Source: Ambulatory Visit | Attending: Obstetrics and Gynecology | Admitting: Obstetrics and Gynecology

## 2015-08-05 ENCOUNTER — Ambulatory Visit (HOSPITAL_COMMUNITY): Payer: Commercial Managed Care - PPO | Admitting: Anesthesiology

## 2015-08-05 DIAGNOSIS — D573 Sickle-cell trait: Secondary | ICD-10-CM | POA: Diagnosis not present

## 2015-08-05 DIAGNOSIS — E785 Hyperlipidemia, unspecified: Secondary | ICD-10-CM | POA: Diagnosis not present

## 2015-08-05 DIAGNOSIS — E119 Type 2 diabetes mellitus without complications: Secondary | ICD-10-CM | POA: Insufficient documentation

## 2015-08-05 DIAGNOSIS — F17200 Nicotine dependence, unspecified, uncomplicated: Secondary | ICD-10-CM | POA: Diagnosis not present

## 2015-08-05 DIAGNOSIS — F909 Attention-deficit hyperactivity disorder, unspecified type: Secondary | ICD-10-CM | POA: Insufficient documentation

## 2015-08-05 DIAGNOSIS — Q210 Ventricular septal defect: Secondary | ICD-10-CM | POA: Insufficient documentation

## 2015-08-05 DIAGNOSIS — E669 Obesity, unspecified: Secondary | ICD-10-CM | POA: Diagnosis not present

## 2015-08-05 DIAGNOSIS — O021 Missed abortion: Secondary | ICD-10-CM | POA: Insufficient documentation

## 2015-08-05 HISTORY — PX: DILATION AND EVACUATION: SHX1459

## 2015-08-05 LAB — BASIC METABOLIC PANEL
Anion gap: 6 (ref 5–15)
BUN: 8 mg/dL (ref 6–20)
CHLORIDE: 104 mmol/L (ref 101–111)
CO2: 26 mmol/L (ref 22–32)
Calcium: 9.5 mg/dL (ref 8.9–10.3)
Creatinine, Ser: 0.79 mg/dL (ref 0.44–1.00)
GFR calc Af Amer: >60 mL/min (ref >60)
GFR calc non Af Amer: >60 mL/min (ref >60)
GLUCOSE: 86 mg/dL (ref 65–99)
POTASSIUM: 3.6 mmol/L (ref 3.5–5.1)
SODIUM: 136 mmol/L (ref 135–145)

## 2015-08-05 LAB — CBC
HCT: 36.4 % (ref 36.0–46.0)
HEMOGLOBIN: 12.4 g/dL (ref 12.0–15.0)
MCH: 27.5 pg (ref 26.0–34.0)
MCHC: 34.1 g/dL (ref 30.0–36.0)
MCV: 80.7 fL (ref 78.0–100.0)
Platelets: 404 10*3/uL — ABNORMAL HIGH (ref 150–400)
RBC: 4.51 MIL/uL (ref 3.87–5.11)
RDW: 13.8 % (ref 11.5–15.5)
WBC: 8 10*3/uL (ref 4.0–10.5)

## 2015-08-05 LAB — GLUCOSE, CAPILLARY: Glucose-Capillary: 66 mg/dL (ref 65–99)

## 2015-08-05 SURGERY — DILATION AND EVACUATION, UTERUS
Anesthesia: Monitor Anesthesia Care | Site: Vagina

## 2015-08-05 MED ORDER — IBUPROFEN 600 MG PO TABS: 600.0000 mg | ORAL_TABLET | Freq: Four times a day (QID) | ORAL | Status: DC | PRN

## 2015-08-05 MED ORDER — HYDROMORPHONE HCL 1 MG/ML IJ SOLN: 0.2500 mg | INTRAMUSCULAR | Status: DC | PRN

## 2015-08-05 MED ORDER — CHLOROPROCAINE HCL 1 % IJ SOLN
INTRAMUSCULAR | Status: DC | PRN
  Administered 2015-08-05: 10 mL

## 2015-08-05 MED ORDER — SCOPOLAMINE 1 MG/3DAYS TD PT72
1.0000 | MEDICATED_PATCH | Freq: Once | TRANSDERMAL | Status: DC
  Administered 2015-08-05: 1.5 mg via TRANSDERMAL

## 2015-08-05 MED ORDER — SCOPOLAMINE 1 MG/3DAYS TD PT72
MEDICATED_PATCH | TRANSDERMAL | Status: AC
  Administered 2015-08-05: 1.5 mg via TRANSDERMAL
  Filled 2015-08-05: qty 1

## 2015-08-05 MED ORDER — KETOROLAC TROMETHAMINE 30 MG/ML IJ SOLN
INTRAMUSCULAR | Status: AC
  Filled 2015-08-05: qty 1

## 2015-08-05 MED ORDER — CHLOROPROCAINE HCL 1 % IJ SOLN
INTRAMUSCULAR | Status: AC
  Filled 2015-08-05: qty 30

## 2015-08-05 MED ORDER — ONDANSETRON HCL 4 MG/2ML IJ SOLN
INTRAMUSCULAR | Status: DC | PRN
  Administered 2015-08-05: 4 mg via INTRAVENOUS

## 2015-08-05 MED ORDER — LACTATED RINGERS IV SOLN
INTRAVENOUS | Status: DC
  Administered 2015-08-05 (×2): via INTRAVENOUS

## 2015-08-05 MED ORDER — MIDAZOLAM HCL 2 MG/2ML IJ SOLN
INTRAMUSCULAR | Status: DC | PRN
  Administered 2015-08-05: 2 mg via INTRAVENOUS

## 2015-08-05 MED ORDER — KETOROLAC TROMETHAMINE 30 MG/ML IJ SOLN
INTRAMUSCULAR | Status: DC | PRN
  Administered 2015-08-05: 30 mg via INTRAVENOUS

## 2015-08-05 MED ORDER — FENTANYL CITRATE (PF) 100 MCG/2ML IJ SOLN
INTRAMUSCULAR | Status: DC | PRN
  Administered 2015-08-05: 100 ug via INTRAVENOUS

## 2015-08-05 MED ORDER — HYDROCODONE-IBUPROFEN 7.5-200 MG PO TABS: 1.0000 | ORAL_TABLET | Freq: Three times a day (TID) | ORAL | Status: DC | PRN

## 2015-08-05 MED ORDER — PROPOFOL 10 MG/ML IV BOLUS
INTRAVENOUS | Status: AC
  Filled 2015-08-05: qty 20

## 2015-08-05 MED ORDER — PROCHLORPERAZINE EDISYLATE 5 MG/ML IJ SOLN: 10.0000 mg | INTRAMUSCULAR | Status: DC | PRN

## 2015-08-05 MED ORDER — FENTANYL CITRATE (PF) 100 MCG/2ML IJ SOLN
INTRAMUSCULAR | Status: AC
  Filled 2015-08-05: qty 2

## 2015-08-05 MED ORDER — PROPOFOL 10 MG/ML IV BOLUS
INTRAVENOUS | Status: DC | PRN
  Administered 2015-08-05 (×3): 100 mg via INTRAVENOUS

## 2015-08-05 MED ORDER — DEXAMETHASONE SODIUM PHOSPHATE 4 MG/ML IJ SOLN
INTRAMUSCULAR | Status: AC
  Filled 2015-08-05: qty 1

## 2015-08-05 MED ORDER — LACTATED RINGERS IV SOLN: INTRAVENOUS | Status: DC

## 2015-08-05 MED ORDER — LIDOCAINE HCL (CARDIAC) 20 MG/ML IV SOLN
INTRAVENOUS | Status: DC | PRN
  Administered 2015-08-05: 100 mg via INTRAVENOUS

## 2015-08-05 MED ORDER — LIDOCAINE HCL (CARDIAC) 20 MG/ML IV SOLN
INTRAVENOUS | Status: AC
  Filled 2015-08-05: qty 5

## 2015-08-05 MED ORDER — MIDAZOLAM HCL 2 MG/2ML IJ SOLN
INTRAMUSCULAR | Status: AC
  Filled 2015-08-05: qty 2

## 2015-08-05 MED ORDER — ONDANSETRON HCL 4 MG/2ML IJ SOLN
INTRAMUSCULAR | Status: AC
  Filled 2015-08-05: qty 2

## 2015-08-05 MED ORDER — MEPERIDINE HCL 25 MG/ML IJ SOLN: 6.2500 mg | INTRAMUSCULAR | Status: DC | PRN

## 2015-08-05 MED ORDER — DEXAMETHASONE SODIUM PHOSPHATE 10 MG/ML IJ SOLN
INTRAMUSCULAR | Status: DC | PRN
  Administered 2015-08-05: 4 mg via INTRAVENOUS

## 2015-08-05 MED ORDER — DEXTROSE 5 % IV SOLN
2.0000 g | INTRAVENOUS | Status: AC
  Administered 2015-08-05: 2 g via INTRAVENOUS
  Filled 2015-08-05: qty 2

## 2015-08-05 SURGICAL SUPPLY — 18 items
CATH ROBINSON RED A/P 16FR (CATHETERS) ×2 IMPLANT
CLOTH BEACON ORANGE TIMEOUT ST (SAFETY) ×2 IMPLANT
DECANTER SPIKE VIAL GLASS SM (MISCELLANEOUS) ×2 IMPLANT
GLOVE BIO SURGEON STRL SZ 6.5 (GLOVE) ×2 IMPLANT
GLOVE BIOGEL PI IND STRL 7.0 (GLOVE) ×2 IMPLANT
GLOVE BIOGEL PI INDICATOR 7.0 (GLOVE) ×2
GOWN STRL REUS W/TWL LRG LVL3 (GOWN DISPOSABLE) ×4 IMPLANT
KIT BERKELEY 1ST TRIMESTER 3/8 (MISCELLANEOUS) ×2 IMPLANT
NS IRRIG 1000ML POUR BTL (IV SOLUTION) ×2 IMPLANT
PACK VAGINAL MINOR WOMEN LF (CUSTOM PROCEDURE TRAY) ×2 IMPLANT
PAD OB MATERNITY 4.3X12.25 (PERSONAL CARE ITEMS) ×2 IMPLANT
PAD PREP 24X48 CUFFED NSTRL (MISCELLANEOUS) ×2 IMPLANT
SET BERKELEY SUCTION TUBING (SUCTIONS) ×2 IMPLANT
TOWEL OR 17X24 6PK STRL BLUE (TOWEL DISPOSABLE) ×4 IMPLANT
VACURETTE 10 RIGID CVD (CANNULA) IMPLANT
VACURETTE 7MM CVD STRL WRAP (CANNULA) ×1 IMPLANT
VACURETTE 8 RIGID CVD (CANNULA) IMPLANT
VACURETTE 9 RIGID CVD (CANNULA) IMPLANT

## 2015-08-05 NOTE — Discharge Instructions (Signed)
°  Do NOT take Motrin until after 7:30 pm tonight.  Drinks lots of fluids and stay hydrated.  Increase your diet as tolerated.  Dilation and Curettage or Vacuum Curettage, Care After Refer to this sheet in the next few weeks. These instructions provide you with information on caring for yourself after your procedure. Your health care provider may also give you more specific instructions. Your treatment has been planned according to current medical practices, but problems sometimes occur. Call your health care provider if you have any problems or questions after your procedure. WHAT TO EXPECT AFTER THE PROCEDURE After your procedure, it is typical to have light cramping and bleeding. This may last for 2 days to 2 weeks after the procedure. HOME CARE INSTRUCTIONS   Do not drive for 24 hours.  Wait 1 week before returning to strenuous activities.  Take your temperature 2 times a day for 4 days and write it down. Provide these temperatures to your health care provider if you develop a fever.  Avoid long periods of standing.  Avoid heavy lifting, pushing, or pulling. Do not lift anything heavier than 10 pounds (4.5 kg).  Limit stair climbing to once or twice a day.  Take rest periods often.  You may resume your usual diet.  Drink enough fluids to keep your urine clear or pale yellow.  Your usual bowel function should return. If you have constipation, you may:  Take a mild laxative with permission from your health care provider.  Add fruit and bran to your diet.  Drink more fluids.  Take showers instead of baths until your health care provider gives you permission to take baths.  Do not go swimming or use a hot tub until your health care provider approves.  Try to have someone with you or available to you the first 24-48 hours, especially if you were given a general anesthetic.  Do not douche, use tampons, or have sex (intercourse) for 2 weeks after the procedure.  Only take  over-the-counter or prescription medicines as directed by your health care provider. Do not take aspirin. It can cause bleeding.  Follow up with your health care provider as directed. SEEK MEDICAL CARE IF:   You have increasing cramps or pain that is not relieved with medicine.  You have abdominal pain that does not seem to be related to the same area of earlier cramping and pain.  You have bad smelling vaginal discharge.  You have a rash.  You are having problems with any medicine. SEEK IMMEDIATE MEDICAL CARE IF:   You have bleeding that is heavier than a normal menstrual period.  You have a fever.  You have chest pain.  You have shortness of breath.  You feel dizzy or feel like fainting.  You pass out.  You have pain in your shoulder strap area.  You have heavy vaginal bleeding with or without blood clots. MAKE SURE YOU:   Understand these instructions.  Will watch your condition.  Will get help right away if you are not doing well or get worse.   This information is not intended to replace advice given to you by your health care provider. Make sure you discuss any questions you have with your health care provider.   Document Released: 03/24/2000 Document Revised: 04/01/2013 Document Reviewed: 10/24/2012 Elsevier Interactive Patient Education [employer].

## 2015-08-05 NOTE — Op Note (Signed)
NAME:  [name], Lynn NO.:  [id number]  MEDICAL RECORD NO.:  [id number]  LOCATION:  [facility]                          FACILITY:  [facility]  PHYSICIAN:  Marylynn Pearson, MD    DATE OF BIRTH:  03/11/1991  DATE OF PROCEDURE:  08/05/2015 DATE OF DISCHARGE:  08/05/2015                              OPERATIVE REPORT   PREOPERATIVE DIAGNOSIS:  Incomplete abortion.  POSTOPERATIVE DIAGNOSIS:  Incomplete abortion.  PROCEDURES: 1. Paracervical block. 2. Dilation and evacuation.  SURGEON:  Marylynn Pearson, MD  ANESTHESIA:  General.  SPECIMEN:  Products of conception.  DESCRIPTION OF PROCEDURE:  The patient was taken to the operating room. After informed consent was obtained, she was placed in the dorsal lithotomy position using Allen stirrups.  Prepped and draped in sterile fashion.  In-and-out catheter was used to drain her bladder.  Bivalve speculum was placed in the vagina and 1% Nesacaine was used to provide local anesthesia to the anterior lip of the cervix.  Single-tooth tenaculum was attached to the remaining 9 mL was used to perform a paracervical block.  The cervix was serially dilated using Pratt dilators and a 7-French suction catheter was used to remove products of conception.  A gentle curetting was then performed until the uterine cry was noted.  Tenaculum was then removed.  The cervix was hemostatic. Speculum was removed.  The sponge, lap, needle, and instrument counts were correct x2.  She tolerated the procedure well.     Marylynn Pearson, MD     GA/MEDQ  D:  08/05/2015  T:  08/05/2015  Job:  [id number]

## 2015-08-05 NOTE — H&P (Addendum)
Lynn Tucker is an 25 y.o. female presents for surgical mngt of missed ab.     Menstrual History: Patient's last menstrual period was 05/30/2015 (approximate).    Past Medical History  Diagnosis Date  . Sickle cell trait ([place])   . VSD (ventricular septal defect)     followed by cardiology every 2 years  . Obesity   . ADHD (attention deficit hyperactivity disorder)   . Diabetes mellitus   . Hyperlipidemia   . Wears glasses   . H. pylori infection 1/16    off antibiotics at this time    Past Surgical History  Procedure Laterality Date  . Wisdom tooth extraction      Family History  Problem Relation Age of Onset  . Cancer Other     maternal side-? type  . Hyperlipidemia Other     ? side  . Stroke Other     paternal side  . Diabetes Other     paternal and maternal side  . Hypertension Mother   . Sickle cell trait Father   . Thyroid disease Paternal Aunt     Social History:  reports that she has been smoking.  She has never used smokeless tobacco. She reports that she drinks about 0.6 oz of alcohol per week. She reports that she does not use illicit drugs.  Allergies: No Known Allergies  No prescriptions prior to admission    ROS  Last menstrual period 05/30/2015. Physical Exam  Gen - NAD Abd - soft, NT/ND Ext - NT, no edema  [place]:  IUP with no FHT  Assessment/Plan: Missed Ab D&E R/b/a discussed, questions answered, informed consent  Courtnee Myer 08/05/2015, 7:38 AM

## 2015-08-05 NOTE — Anesthesia Procedure Notes (Signed)
Procedure Name: LMA Insertion Date/Time: 08/05/2015 1:19 PM Performed by: Hewitt Blade Pre-anesthesia Checklist: Patient identified, Patient being monitored, Emergency Drugs available and Suction available Patient Re-evaluated:Patient Re-evaluated prior to inductionOxygen Delivery Method: Circle system utilized Preoxygenation: Pre-oxygenation with 100% oxygen Intubation Type: IV induction LMA: LMA inserted LMA Size: 4.0 Tube size: 4.0 mm Number of attempts: 1 Placement Confirmation: breath sounds checked- equal and bilateral and positive ETCO2 Tube secured with: Tape Dental Injury: Teeth and Oropharynx as per pre-operative assessment

## 2015-08-05 NOTE — Transfer of Care (Signed)
Immediate Anesthesia Transfer of Care Note  Patient: Lynn Tucker  Procedure(s) Performed: Procedure(s): DILATATION AND EVACUATION (N/A)  Patient Location: PACU  Anesthesia Type:General  Level of Consciousness: awake, alert  and oriented  Airway & Oxygen Therapy: Patient Spontanous Breathing and Patient connected to nasal cannula oxygen  Post-op Assessment: Report given to RN, Post -op Vital signs reviewed and stable and Patient moving all extremities  Post vital signs: Reviewed and stable  Last Vitals: There were no vitals filed for this visit.  Last Pain: There were no vitals filed for this visit.       Complications: No apparent anesthesia complications

## 2015-08-05 NOTE — Anesthesia Postprocedure Evaluation (Signed)
Anesthesia Post Note  Patient: Lynn Tucker  Procedure(s) Performed: Procedure(s) (LRB): DILATATION AND EVACUATION (N/A)  Patient location during evaluation: PACU Anesthesia Type: General Level of consciousness: awake and alert Pain management: pain level controlled Vital Signs Assessment: post-procedure vital signs reviewed and stable Respiratory status: spontaneous breathing, nonlabored ventilation, respiratory function stable and patient connected to nasal cannula oxygen Cardiovascular status: blood pressure returned to baseline and stable Postop Assessment: no signs of nausea or vomiting Anesthetic complications: no     Last Vitals:  Filed Vitals:   08/05/15 1415 08/05/15 1430  BP: 119/73 120/70  Pulse: 74 81  Temp:  36.9 C  Resp: 18 17    Last Pain: There were no vitals filed for this visit. Pain Goal:                 Effie Berkshire

## 2015-08-05 NOTE — Anesthesia Preprocedure Evaluation (Signed)
Anesthesia Evaluation  Patient identified by MRN, date of birth, ID band Patient awake    Reviewed: Allergy & Precautions, NPO status , Patient's Chart, lab work & pertinent test results  Airway Mallampati: II  TM Distance: >3 FB Neck ROM: Full    Dental  (+) Teeth Intact, Dental Advisory Given   Pulmonary Current Smoker,    breath sounds clear to auscultation       Cardiovascular negative cardio ROS   Rhythm:Regular Rate:Normal     Neuro/Psych PSYCHIATRIC DISORDERS negative neurological ROS     GI/Hepatic negative GI ROS, Neg liver ROS,   Endo/Other  diabetes  Renal/GU negative Renal ROS  negative genitourinary   Musculoskeletal negative musculoskeletal ROS (+)   Abdominal   Peds negative pediatric ROS (+)  Hematology negative hematology ROS (+)   Anesthesia Other Findings   Reproductive/Obstetrics negative OB ROS                             Anesthesia Physical Anesthesia Plan  ASA: II  Anesthesia Plan: MAC   Post-op Pain Management:    Induction: Intravenous  Airway Management Planned: Simple Face Mask  Additional Equipment:   Intra-op Plan:   Post-operative Plan:   Informed Consent: I have reviewed the patients History and Physical, chart, labs and discussed the procedure including the risks, benefits and alternatives for the proposed anesthesia with the patient or authorized representative who has indicated his/her understanding and acceptance.   Dental advisory given  Plan Discussed with: CRNA  Anesthesia Plan Comments:         Anesthesia Quick Evaluation

## 2015-08-06 ENCOUNTER — Encounter (HOSPITAL_COMMUNITY): Payer: Self-pay | Admitting: Obstetrics and Gynecology

## 2015-11-08 ENCOUNTER — Encounter: Payer: Self-pay | Admitting: Obstetrics & Gynecology

## 2015-11-08 ENCOUNTER — Ambulatory Visit (INDEPENDENT_AMBULATORY_CARE_PROVIDER_SITE_OTHER): Payer: Commercial Managed Care - PPO | Admitting: Obstetrics & Gynecology

## 2015-11-08 VITALS — BP 122/64 | HR 72 | Resp 16 | Ht 61.0 in | Wt 180.8 lb

## 2015-11-08 DIAGNOSIS — Z124 Encounter for screening for malignant neoplasm of cervix: Secondary | ICD-10-CM | POA: Diagnosis not present

## 2015-11-08 DIAGNOSIS — N911 Secondary amenorrhea: Secondary | ICD-10-CM | POA: Diagnosis not present

## 2015-11-08 DIAGNOSIS — Z0000 Encounter for general adult medical examination without abnormal findings: Secondary | ICD-10-CM | POA: Diagnosis not present

## 2015-11-08 DIAGNOSIS — Z01419 Encounter for gynecological examination (general) (routine) without abnormal findings: Secondary | ICD-10-CM | POA: Diagnosis not present

## 2015-11-08 DIAGNOSIS — Z202 Contact with and (suspected) exposure to infections with a predominantly sexual mode of transmission: Secondary | ICD-10-CM | POA: Diagnosis not present

## 2015-11-08 LAB — HIV ANTIBODY (ROUTINE TESTING W REFLEX): HIV 1&2 Ab, 4th Generation: NONREACTIVE

## 2015-11-08 LAB — HM DIABETES EYE EXAM

## 2015-11-08 LAB — POCT URINE PREGNANCY: Preg Test, Ur: NEGATIVE

## 2015-11-08 MED ORDER — XULANE 150-35 MCG/24HR TD PTWK: 1.0000 | MEDICATED_PATCH | TRANSDERMAL | 4 refills | Status: DC

## 2015-11-08 NOTE — Progress Notes (Addendum)
25 y.o. G1P1 SingleAfrican AmericanF here for annual exam.  Patient had D&E on 09/04/15. Patient states has not had menses since. Only using Xulane patch for [facility].  She did have nausea when she first started using the patch.  This has improved.  She does like how easy it is to use the patch.  Pt reports she just had blood work for TSH, prolactin, and FSH at [facility] about two weeks ago.  Will get copies of this.  UPT negative today.  Requests STD testing.    PCP:  Tysinger         Sexually active: Yes.    The current method of family planning is Patch.    Exercising: No.  The patient does not participate in regular exercise at present. Smoker:  yes  Health Maintenance: Pap:  05/14/14 Neg  History of abnormal Pap:  no Gardasil: Completed 2011  TDaP:  06/2007  Screening Labs: PCP, Hb today: PCP, Urine today: PCP   reports that she has been smoking.  She has never used smokeless tobacco. She reports that she drinks about 0.6 oz of alcohol per week . She reports that she does not use drugs.  Past Medical History:  Diagnosis Date  . ADHD (attention deficit hyperactivity disorder)   . Diabetes mellitus   . H. pylori infection 1/16   off antibiotics at this time  . Hyperlipidemia   . Obesity   . Sickle cell trait ([place])   . VSD (ventricular septal defect)    followed by cardiology every 2 years  . Wears glasses     Past Surgical History:  Procedure Laterality Date  . DILATION AND EVACUATION N/A 08/05/2015   Procedure: DILATATION AND EVACUATION;  Surgeon: Marylynn Pearson, MD;  Location: [facility];  Service: Gynecology;  Laterality: N/A;  . WISDOM TOOTH EXTRACTION      Family History  Problem Relation Age of Onset  . Cancer Other     maternal side-? type  . Hyperlipidemia Other     ? side  . Stroke Other     paternal side  . Diabetes Other     paternal and maternal side  . Hypertension Mother   . Sickle cell trait Father   . Thyroid disease Paternal Aunt     ROS:   Pertinent items are noted in HPI.  Otherwise, a comprehensive ROS was negative.  Exam:   Vitals:   11/08/15 0857  BP: 122/64  Pulse: 72  Resp: 16  Weight: 180 lb 12.8 oz (82 kg)  Height: 5\' 1"  (1.549 m)   General appearance: alert, cooperative and appears stated age Head: Normocephalic, without obvious abnormality, atraumatic Neck: no adenopathy, supple, symmetrical, trachea midline and thyroid normal to inspection and palpation Lungs: clear to auscultation bilaterally Breasts: normal appearance, no masses or tenderness Heart: regular rate and rhythm Abdomen: soft, non-tender; bowel sounds normal; no masses,  no organomegaly Extremities: extremities normal, atraumatic, no cyanosis or edema Skin: Skin color, texture, turgor normal. No rashes or lesions Lymph nodes: Cervical, supraclavicular, and axillary nodes normal. No abnormal inguinal nodes palpated Neurologic: Grossly normal   Pelvic: External genitalia:  no lesions              Urethra:  normal appearing urethra with no masses, tenderness or lesions              Bartholins and Skenes: normal                 Vagina:  normal appearing vagina with normal color and discharge, no lesions              Cervix: no lesions              Pap taken: Yes.   Bimanual Exam:  Uterus:  normal size, contour, position, consistency, mobility, non-tender              Adnexa: normal adnexa and no mass, fullness, tenderness               Rectovaginal: Confirms               Anus:  normal sphincter tone, no lesions  Chaperone was present for exam.  A:  Well Woman with normal exam  H/O secondary amenorrhea with generic ortho Evra patch Diabetes with HbA1C around six, on oral agent H/O incomplete abortion with D&C 5/17 Occasional smoker  P:   Mammogram guidelines reviewed pap smear with GC/CHl obtained today HIV and RPR today RF for generic Ortho Evra patch, 3 month supply with RFs to pharmacy Pt will sign release for labs [facility].  She knows to call if doesn't have a cycle two months after stopping contraception for evaluation of asherman's syndrome return annually or prn  Outside records reviewed 11/18/15.  TSH and panel, FSH, prolactin, and HCG were all WNL 10/27/15.

## 2015-11-09 LAB — RPR

## 2015-11-10 LAB — IPS N GONORRHOEA AND CHLAMYDIA BY PCR

## 2015-11-10 LAB — IPS PAP SMEAR ONLY

## 2015-11-11 ENCOUNTER — Encounter: Payer: Self-pay | Admitting: Obstetrics & Gynecology

## 2015-11-11 ENCOUNTER — Telehealth: Payer: Self-pay | Admitting: Emergency Medicine

## 2015-11-11 DIAGNOSIS — A749 Chlamydial infection, unspecified: Secondary | ICD-10-CM

## 2015-11-11 HISTORY — DX: Chlamydial infection, unspecified: A74.9

## 2015-11-11 MED ORDER — AZITHROMYCIN 500 MG PO TABS: 1000.0000 mg | ORAL_TABLET | Freq: Once | ORAL | 0 refills | Status: AC

## 2015-11-11 NOTE — Telephone Encounter (Signed)
Notes Recorded by Megan Salon, MD on 11/11/2015 at 8:24 AM EDT Please let pt know her chlamydia testing is positive. Needs RX for azithromax 1 gm po x 1. Partner needs testing as well. She should not be SA until he is tested and treated. She needs repeat testing 3 months. Also, needs reporting to HD.

## 2015-11-11 NOTE — Telephone Encounter (Signed)
-----   Message from Polly Cobia, [place] sent at 11/11/2015 11:17 AM EDT ----- Positive Chlamydia Routed to Triage

## 2015-11-11 NOTE — Telephone Encounter (Signed)
Message from provider given and advised of positive Chlamydia Results.  Advised rx was sent to pharmacy, should take treatment as directed, ensure to take with food. Azithromycin 1 gram PO take at once with food sent to pharmacy of choice.   Advised will need to complete treatment and notify partner(s). Stressed need to notify partner and abstain until treatment. To wait seven days after they have completed treatment.   Scheduled appointment for 3 month test of cure.  Rescreen scheduled for 01/20/16 at 1115.   Advised reportable condition and that report would be sent to health department.Confidential communicable disease report-Part one completed and faxed to [facility] Department, form [id number], faxed with fax confirmation received and original sent to medical records.   Patient did not have any questions regarding testing/treatment at this time. Advised to call back with any, she is agreeable and verbalized understanding for very important need for treatment and follow up.

## 2015-11-23 ENCOUNTER — Encounter: Payer: Self-pay | Admitting: Medical

## 2015-12-07 ENCOUNTER — Telehealth: Payer: Self-pay | Admitting: Obstetrics & Gynecology

## 2015-12-07 NOTE — Telephone Encounter (Signed)
Returned call to patient. Patient states she would like to switch her birth control from the patch to the pill because "I haven't seen my period since taking the patch. I want the pills so I can take the sugar pills and my period will come through." Patient states she and Dr. Sabra Heck discussed this at her last appointment and she would just like her recommendation on which pill to take. RN advised this message would be sent to Dr. Sabra Heck and our office would call her back with Dr. Ammie Ferrier response. Patient agreeable. Patient aware Dr. Sabra Heck is seeing patient's today, so a response might not be immediate.   Routing to provider for review.

## 2015-12-07 NOTE — Telephone Encounter (Signed)
Patient called and said, "I'd like to change my birth control from the patch to the pill."  Pharmacy on file is correct.

## 2015-12-08 MED ORDER — NORETHIN ACE-ETH ESTRAD-FE 1-20 MG-MCG(24) PO TABS: 1.0000 | ORAL_TABLET | Freq: Every day | ORAL | 3 refills | Status: DC

## 2015-12-08 NOTE — Telephone Encounter (Signed)
Rx changed to Loestrin 1/20 FE.  I've already put the rx order in for her.  I did order 90 day supply for her.  She can switch after finishing current month of Ortho evra.  She just needs to start right into the new pack.  Please have her call if cycles do not return on the OCPs.  Thanks.

## 2015-12-08 NOTE — Telephone Encounter (Signed)
Pt aware of switch to ocp. Pt to finish ortho evra & then start pill pack. Pt to call back in 75mths if no cycle.

## 2016-01-06 ENCOUNTER — Telehealth: Payer: Self-pay | Admitting: Obstetrics & Gynecology

## 2016-01-06 NOTE — Telephone Encounter (Signed)
Left patient a message to call back to reschedule a future appointment that was cancelled by the provider for a TOC.

## 2016-01-20 ENCOUNTER — Ambulatory Visit: Payer: Commercial Managed Care - PPO | Admitting: Obstetrics & Gynecology

## 2016-01-24 ENCOUNTER — Telehealth: Payer: Self-pay | Admitting: Obstetrics & Gynecology

## 2016-01-24 NOTE — Telephone Encounter (Signed)
Patient called and cancelled her appointment with Dr. Sabra Heck tomorrow for "test of cure." She said, "I can't make it tomorrow and I'll have to call back to reschedule later."

## 2016-01-24 NOTE — Telephone Encounter (Signed)
Pt has hx of chlamydia and called to cancel her three month follow up test.  Please call and see if you can get this rescheduled.  Thanks.

## 2016-01-25 ENCOUNTER — Ambulatory Visit: Payer: Commercial Managed Care - PPO | Admitting: Obstetrics & Gynecology

## 2016-01-25 NOTE — Telephone Encounter (Signed)
Spoke with patient. Appointment for Gc/Chl rescheduled for 02/03/2016 at 2:45 pm with Dr.Miller. Patient is agreeable to date and time.  Routing to provider for final review. Patient agreeable to disposition. Will close encounter.

## 2016-01-27 ENCOUNTER — Ambulatory Visit: Payer: Commercial Managed Care - PPO | Admitting: Obstetrics & Gynecology

## 2016-02-03 ENCOUNTER — Encounter: Payer: Self-pay | Admitting: Obstetrics & Gynecology

## 2016-02-03 ENCOUNTER — Ambulatory Visit (INDEPENDENT_AMBULATORY_CARE_PROVIDER_SITE_OTHER): Payer: Commercial Managed Care - PPO | Admitting: Obstetrics & Gynecology

## 2016-02-03 VITALS — BP 126/60 | HR 88 | Resp 16 | Ht 60.0 in | Wt 187.6 lb

## 2016-02-03 DIAGNOSIS — N911 Secondary amenorrhea: Secondary | ICD-10-CM

## 2016-02-03 DIAGNOSIS — Z8619 Personal history of other infectious and parasitic diseases: Secondary | ICD-10-CM

## 2016-02-03 MED ORDER — NORETHIN ACE-ETH ESTRAD-FE 1-20 MG-MCG PO TABS: 1.0000 | ORAL_TABLET | Freq: Every day | ORAL | 10 refills | Status: DC

## 2016-02-03 NOTE — Progress Notes (Signed)
GYNECOLOGY  VISIT   HPI: 25 y.o. G23P0010 Single African American female here for recheck of prior positive chlamydia in July.  Partner was treated and tested as well.  Chlamydia was the only thing that was positive.  Denies other symptoms.  Also wants to talk about pills because she'd rather have some bleeding each month.  On Loestrin 24 after having amenorrhea with Ortho evra patch.  She is at the end of the second month.  Feels like her cycle could start but only has one more day in the pack of pills so not sure she is going to have a cycle.  Pt reports she is not have any symptoms with current OCPs except maybe she feels a little more emotional.      She did have evaluation for amenorrhea at [facility] and I did get these results.  TSH, prolactin, FSH were normal.  Reviewed with pt.  Patient Active Problem List   Diagnosis Date Noted  . Type II or unspecified type diabetes mellitus without mention of complication, not stated as uncontrolled 02/08/2012  . Hyperlipidemia 02/08/2012  . Secondary amenorrhea 02/08/2012  . Hyperlipidemia LDL goal <70 07/24/2011  . Diabetes mellitus ([place]) 03/15/2011  . Obesity (BMI 30-39.9) 03/15/2011    Past Medical History:  Diagnosis Date  . ADHD (attention deficit hyperactivity disorder)   . Chlamydia 11/11/2015  . Diabetes mellitus   . H. pylori infection 1/16   off antibiotics at this time  . Hyperlipidemia   . Obesity   . Sickle cell trait ([place])   . VSD (ventricular septal defect)    followed by cardiology every 2 years  . Wears glasses     Past Surgical History:  Procedure Laterality Date  . DILATION AND EVACUATION N/A 08/05/2015   Procedure: DILATATION AND EVACUATION;  Surgeon: Marylynn Pearson, MD;  Location: [facility];  Service: Gynecology;  Laterality: N/A;  . WISDOM TOOTH EXTRACTION      MEDS:  Reviewed in EPIC and UTD  ALLERGIES: Review of patient's allergies indicates no known allergies.  Family History  Problem Relation  Age of Onset  . Hypertension Mother   . Sickle cell trait Father   . Cancer Other     maternal side-? type  . Hyperlipidemia Other     ? side  . Stroke Other     paternal side  . Diabetes Other     paternal and maternal side  . Thyroid disease Paternal Aunt     SH:  Smokes socially, single  Review of Systems  All other systems reviewed and are negative.   PHYSICAL EXAMINATION:    BP 126/60 (BP Location: Right Arm, Patient Position: Sitting, Cuff Size: Large)   Pulse 88   Resp 16   Ht 5' (1.524 m)   Wt 187 lb 9.6 oz (85.1 kg)   BMI 36.64 kg/m     General appearance: alert, cooperative and appears stated age Abdomen: soft, non-tender; bowel sounds normal; no masses,  no organomegaly  Pelvic: External genitalia:  no lesions              Urethra:  normal appearing urethra with no masses, tenderness or lesions              Bartholins and Skenes: normal                 Vagina: normal appearing vagina with normal color and discharge, no lesions.  There is blood in the vagina and at cervical  os consistent with onset of menses.              Cervix: no lesions              Bimanual Exam:  Uterus:  normal size, contour, position, consistency, mobility, non-tender              Adnexa: normal adnexa and no mass, fullness, tenderness              Rectovaginal: No.              Anus:  normal sphincter tone, no lesions  Chaperone was present for exam.  Assessment: H/O positive chlamydia test, here for 3 month TOC Secondary amenorrhea on Ortho Evra.  Pt is starting cycle today (and is very relieved about this). Desires monthly cycle  Plan: GC/Chl testing obtained today Will switch to Loestrin 1/20 to see if this will help with pt having regular cycles.  She knows to call and give update.

## 2016-02-04 LAB — GC/CHLAMYDIA PROBE AMP
CT PROBE, AMP APTIMA: NOT DETECTED
GC Probe RNA: NOT DETECTED

## 2016-02-05 ENCOUNTER — Encounter: Payer: Self-pay | Admitting: Obstetrics & Gynecology

## 2016-02-15 ENCOUNTER — Encounter: Payer: Self-pay | Admitting: Obstetrics and Gynecology

## 2016-02-15 ENCOUNTER — Ambulatory Visit (INDEPENDENT_AMBULATORY_CARE_PROVIDER_SITE_OTHER): Payer: Commercial Managed Care - PPO | Admitting: Obstetrics and Gynecology

## 2016-02-15 ENCOUNTER — Encounter: Payer: Self-pay | Admitting: Obstetrics & Gynecology

## 2016-02-15 VITALS — BP 110/60 | HR 84 | Temp 98.9°F | Resp 16 | Ht 60.0 in | Wt 186.0 lb

## 2016-02-15 DIAGNOSIS — L0501 Pilonidal cyst with abscess: Secondary | ICD-10-CM

## 2016-02-15 NOTE — Progress Notes (Signed)
GYNECOLOGY  VISIT   HPI: 25 y.o.   Single  African American  female   G1P0010 with Patient's last menstrual period was 02/03/2016.   here for c/o a boil on her tailbone. Symptoms started yesterday morning, very uncomfortable. Today it is a little better. No fevers, only hurts if she touches it.     GYNECOLOGIC HISTORY: Patient's last menstrual period was 02/03/2016. Contraception:OCP's Menopausal hormone therapy: NA        OB History    Gravida Para Term Preterm AB Living   1 0 0 0 1 0   SAB TAB Ectopic Multiple Live Births   1 0 0 0        Obstetric Comments   D&E for incomplete abortion         Patient Active Problem List   Diagnosis Date Noted  . Hyperlipidemia 02/08/2012  . Diabetes mellitus ([place]) 03/15/2011  . Obesity (BMI 30-39.9) 03/15/2011    Past Medical History:  Diagnosis Date  . ADHD (attention deficit hyperactivity disorder)   . Chlamydia 11/11/2015  . Diabetes mellitus   . H. pylori infection 1/16   off antibiotics at this time  . Hyperlipidemia   . Obesity   . Sickle cell trait ([place])   . VSD (ventricular septal defect)    followed by cardiology every 2 years  . Wears glasses     Past Surgical History:  Procedure Laterality Date  . DILATION AND EVACUATION N/A 08/05/2015   Procedure: DILATATION AND EVACUATION;  Surgeon: Marylynn Pearson, MD;  Location: [facility];  Service: Gynecology;  Laterality: N/A;  . WISDOM TOOTH EXTRACTION      Current Outpatient Prescriptions  Medication Sig Dispense Refill  . HYDROcodone-ibuprofen (VICOPROFEN) 7.5-200 MG tablet Take 0.5 tablets by mouth as needed for moderate pain.    . metFORMIN (GLUCOPHAGE) 500 MG tablet Take 1 tablet (500 mg total) by mouth 2 (two) times daily with a meal. 1 tablet po daily for diabetes 60 tablet 2  . norethindrone-ethinyl estradiol (JUNEL FE,GILDESS FE,LOESTRIN FE) 1-20 MG-MCG tablet Take 1 tablet by mouth daily. 1 Package 10   No current facility-administered medications for this visit.       ALLERGIES: Patient has no known allergies.  Family History  Problem Relation Age of Onset  . Hypertension Mother   . Sickle cell trait Father   . Cancer Other     maternal side-? type  . Hyperlipidemia Other     ? side  . Stroke Other     paternal side  . Diabetes Other     paternal and maternal side  . Thyroid disease Paternal Aunt     Social History   Social History  . Marital status: Single    Spouse name: N/A  . Number of children: N/A  . Years of education: N/A   Occupational History  . Not on file.   Social History Main Topics  . Smoking status: Never Smoker  . Smokeless tobacco: Never Used  . Alcohol use 0.6 oz/week    1 Standard drinks or equivalent per week  . Drug use: No  . Sexual activity: Yes    Partners: Male    Birth control/ protection: Pill   Other Topics Concern  . 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    ROS  PHYSICAL EXAMINATION:    BP 110/60 (BP  Location: Right Arm, Patient Position: Sitting, Cuff Size: Normal)   Pulse 84   Temp 98.9 F (37.2 C) (Oral)   Resp 16   Ht 5' (1.524 m)   Wt 186 lb (84.4 kg)   LMP 02/03/2016   BMI 36.33 kg/m     General appearance: alert, cooperative and appears stated age Over her tailbone is a tender bump, hard to feel because she is tender, 1-2 cm. No surrounding erythema.   Chaperone was present for exam.  ASSESSMENT Pilonidal cyst with abscess    PLAN Warm compresses, hot soaks Referred to the surgery urgent care   An After Visit Summary was printed and given to the patient.

## 2016-02-15 NOTE — Patient Instructions (Signed)
Pilonidal Cyst A pilonidal cyst is a fluid-filled sac. It forms beneath the skin near your tailbone, at the top of the crease of your buttocks. A pilonidal cyst that is not large or infected may not cause symptoms or problems. If the cyst becomes irritated or infected, it may fill with pus. This causes pain and swelling (pilonidal abscess). An infected cyst may need to be treated with medicine, drained, or removed. CAUSES The cause of a pilonidal cyst is not known. One cause may be a hair that grows into your skin (ingrown hair). RISK FACTORS Pilonidal cysts are more common in boys and men. Risk factors include:  Having lots of hair near the crease of the buttocks.  Being overweight.  Having a pilonidal dimple.  Wearing tight clothing.  Not bathing or showering frequently.  Sitting for long periods of time. SIGNS AND SYMPTOMS Signs and symptoms of a pilonidal cyst may include:  Redness.  Pain and tenderness.  Warmth.  Swelling.  Pus.  Fever. DIAGNOSIS Your health care provider may diagnose a pilonidal cyst based on your symptoms and a physical exam. The health care provider may do a blood test to check for infection. If your cyst is draining pus, your health care provider may take a sample of the drainage to be tested at a laboratory. TREATMENT Surgery is the usual treatment for an infected pilonidal cyst. You may also have to take medicines before surgery. The type of surgery you have depends on the size and severity of the infected cyst. The different kinds of surgery include:  Incision and drainage. This is a procedure to open and drain the cyst.  Marsupialization. In this procedure, a large cyst or abscess may be opened and kept open by stitching the edges of the skin to the cyst walls.  Cyst removal. This procedure involves opening the skin and removing all or part of the cyst. HOME CARE INSTRUCTIONS  Follow all of your surgeon's instructions carefully if you had  surgery.  Take medicines only as directed by your health care provider.  If you were prescribed an antibiotic medicine, finish it all even if you start to feel better.  Keep the area around your pilonidal cyst clean and dry.  Clean the area as directed by your health care provider. Pat the area dry with a clean towel. Do not rub it as this may cause bleeding.  Remove hair from the area around the cyst as directed by your health care provider.  Do not wear tight clothing or sit in one place for long periods of time.  There are many different ways to close and cover an incision, including stitches, skin glue, and adhesive strips. Follow your health care provider's instructions on:  Incision care.  Bandage (dressing) changes and removal.  Incision closure removal. SEEK MEDICAL CARE IF:   You have drainage, redness, swelling, or pain at the site of the cyst.  You have a fever.   This information is not intended to replace advice given to you by your health care provider. Make sure you discuss any questions you have with your health care provider.   Document Released: 03/24/2000 Document Revised: 04/17/2014 Document Reviewed: 08/14/2013 Elsevier Interactive Patient Education [employer].

## 2016-03-13 ENCOUNTER — Ambulatory Visit (INDEPENDENT_AMBULATORY_CARE_PROVIDER_SITE_OTHER): Payer: Commercial Managed Care - PPO | Admitting: Medical

## 2016-03-13 ENCOUNTER — Encounter: Payer: Self-pay | Admitting: Medical

## 2016-03-13 VITALS — BP 122/80 | HR 77 | Ht 61.0 in | Wt 182.2 lb

## 2016-03-13 DIAGNOSIS — Z0000 Encounter for general adult medical examination without abnormal findings: Secondary | ICD-10-CM | POA: Diagnosis not present

## 2016-03-13 DIAGNOSIS — E669 Obesity, unspecified: Secondary | ICD-10-CM | POA: Diagnosis not present

## 2016-03-13 DIAGNOSIS — E118 Type 2 diabetes mellitus with unspecified complications: Secondary | ICD-10-CM | POA: Diagnosis not present

## 2016-03-13 DIAGNOSIS — Q210 Ventricular septal defect: Secondary | ICD-10-CM | POA: Diagnosis not present

## 2016-03-13 DIAGNOSIS — E785 Hyperlipidemia, unspecified: Secondary | ICD-10-CM | POA: Diagnosis not present

## 2016-03-13 DIAGNOSIS — R011 Cardiac murmur, unspecified: Secondary | ICD-10-CM | POA: Diagnosis not present

## 2016-03-13 DIAGNOSIS — L83 Acanthosis nigricans: Secondary | ICD-10-CM | POA: Diagnosis not present

## 2016-03-13 DIAGNOSIS — Z2821 Immunization not carried out because of patient refusal: Secondary | ICD-10-CM | POA: Diagnosis not present

## 2016-03-13 LAB — CBC
HCT: 37.2 % (ref 35.0–45.0)
Hemoglobin: 12.2 g/dL (ref 11.7–15.5)
MCH: 27.2 pg (ref 27.0–33.0)
MCHC: 32.8 g/dL (ref 32.0–36.0)
MCV: 82.9 fL (ref 80.0–100.0)
MPV: 10.1 fL (ref 7.5–12.5)
PLATELETS: 366 10*3/uL (ref 140–400)
RBC: 4.49 MIL/uL (ref 3.80–5.10)
RDW: 13.8 % (ref 11.0–15.0)
WBC: 5.9 10*3/uL (ref 4.0–10.5)

## 2016-03-13 LAB — POCT URINALYSIS DIPSTICK
Bilirubin, UA: NEGATIVE
Blood, UA: NEGATIVE
CLARITY UA: NEGATIVE
Glucose, UA: NEGATIVE
KETONES UA: NEGATIVE
LEUKOCYTES UA: NEGATIVE
NITRITE UA: NEGATIVE
PROTEIN UA: NEGATIVE
Spec Grav, UA: 1.025
Urobilinogen, UA: NEGATIVE
pH, UA: 6

## 2016-03-13 LAB — COMPREHENSIVE METABOLIC PANEL
ALBUMIN: 4.2 g/dL (ref 3.6–5.1)
ALT: 24 U/L (ref 6–29)
AST: 28 U/L (ref 10–30)
Alkaline Phosphatase: 55 U/L (ref 33–115)
BILIRUBIN TOTAL: 0.5 mg/dL (ref 0.2–1.2)
BUN: 9 mg/dL (ref 7–25)
CO2: 27 mmol/L (ref 20–31)
CREATININE: 0.83 mg/dL (ref 0.50–1.10)
Calcium: 9.4 mg/dL (ref 8.6–10.2)
Chloride: 103 mmol/L (ref 98–110)
Glucose, Bld: 96 mg/dL (ref 65–99)
Potassium: 3.9 mmol/L (ref 3.5–5.3)
SODIUM: 138 mmol/L (ref 135–146)
TOTAL PROTEIN: 7.8 g/dL (ref 6.1–8.1)

## 2016-03-13 LAB — LIPID PANEL
Cholesterol: 174 mg/dL (ref <200)
HDL: 46 mg/dL — AB (ref >50)
LDL Cholesterol: 112 mg/dL — ABNORMAL HIGH (ref <100)
Total CHOL/HDL Ratio: 3.8 Ratio (ref <5.0)
Triglycerides: 79 mg/dL (ref <150)
VLDL: 16 mg/dL (ref <30)

## 2016-03-13 LAB — HEMOGLOBIN A1C
Hgb A1c MFr Bld: 6.3 % — ABNORMAL HIGH (ref <5.7)
Mean Plasma Glucose: 134 mg/dL

## 2016-03-13 NOTE — Progress Notes (Signed)
Subjective:   HPI  Lynn Tucker is a 25 y.o. female who presents for a complete physical. Chief Complaint  Patient presents with  . physical    physical, no pap    Here for routine physical  Medical care team includes:  Crisoforo Oxford, PA-C here for primary care  Gynecology  Last cardiology visit 2004   Concerns: She has diabetes diagnosed 2013, but has mainly been diet controlled.  She exercise some, tries to eat relatively healthy, does check glucose some.   Usually sees fasting 80-110 glucose.  Checks feet, no concerns.  Reviewed their medical, surgical, family, social, medication, and allergy history and updated chart as appropriate.  Past Medical History:  Diagnosis Date  . ADHD (attention deficit hyperactivity disorder)   . Chlamydia 11/11/2015  . Diabetes mellitus 2013  . H. pylori infection 1/16   off antibiotics at this time  . Hyperlipidemia   . Obesity   . Sickle cell trait ([place])   . VSD (ventricular septal defect)    followed by cardiology every 2 years  . Wears glasses     Past Surgical History:  Procedure Laterality Date  . DILATION AND EVACUATION N/A 08/05/2015   Procedure: DILATATION AND EVACUATION;  Surgeon: Marylynn Pearson, MD;  Location: [facility];  Service: Gynecology;  Laterality: N/A;  . WISDOM TOOTH EXTRACTION      Social History   Social History  . Marital status: Single    Spouse name: N/A  . Number of children: N/A  . Years of education: N/A   Occupational History  . Not on file.   Social History Main Topics  . Smoking status: Never Smoker  . Smokeless tobacco: Never Used  . Alcohol use 0.6 oz/week    1 Standard drinks or equivalent per week  . Drug use: No  . Sexual activity: Yes    Partners: Male    Birth control/ protection: Pill   Other Topics Concern  . 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    Family History  Problem Relation Age of Onset  . Hypertension Mother   . Sickle cell trait Father   . Cancer Other     maternal side-? type  . Hyperlipidemia Other     ? side  . Stroke Other     paternal side  . Diabetes Other     paternal and maternal side  . Thyroid disease Paternal Aunt      Current Outpatient Prescriptions:  .  metFORMIN (GLUCOPHAGE) 500 MG tablet, Take 1 tablet (500 mg total) by mouth 2 (two) times daily with a meal. 1 tablet po daily for diabetes, Disp: 60 tablet, Rfl: 2 .  norethindrone-ethinyl estradiol (JUNEL FE,GILDESS FE,LOESTRIN FE) 1-20 MG-MCG tablet, Take 1 tablet by mouth daily., Disp: 1 Package, Rfl: 10  No Known Allergies    Review of Systems Constitutional: -fever, -chills, -sweats, -unexpected weight change, -decreased appetite, -fatigue Allergy: -sneezing, -itching, -congestion Dermatology: -changing moles, --rash, -lumps ENT: -runny nose, -ear pain, -sore throat, -hoarseness, -sinus pain, -teeth pain, - ringing in ears, -hearing loss, -nosebleeds Cardiology: -chest pain, -palpitations, -swelling, -difficulty breathing when lying flat, -waking up short of breath Respiratory: -cough, -shortness of breath, -difficulty breathing with exercise or exertion, -wheezing, -coughing up blood Gastroenterology: -abdominal pain, -nausea, -vomiting, -diarrhea, -constipation, -blood in stool, -changes in bowel movement, -difficulty swallowing or  eating Hematology: -bleeding, -bruising  Musculoskeletal: -joint aches, -muscle aches, -joint swelling, -back pain, -neck pain, -cramping, -changes in gait Ophthalmology: denies vision changes, eye redness, itching, discharge Urology: -burning with urination, -difficulty urinating, -blood in urine, -urinary frequency, -urgency, -incontinence Neurology: -headache, -weakness, -tingling, -numbness, -memory loss, -falls, -dizziness Psychology: -depressed mood, -agitation, -sleep problems     Objective:    Physical Exam  BP 122/80   Pulse 77   Ht 5\' 1"  (1.549 m)   Wt 182 lb 3.2 oz (82.6 kg)   LMP 03/10/2016   SpO2 99%   BMI 34.43 kg/m   General appearance: alert, no distress, WD/WN, AA female Skin: neck with darker coloration and somewhat thickened skin c/w acanthosis nigricans, scattered macules no worrisome lesions HEENT: normocephalic, conjunctiva/corneas normal, sclerae anicteric, PERRLA, EOMi, nares patent, no discharge or erythema, pharynx normal Oral cavity: MMM, tongue normal, teeth in good repair Neck: supple, no lymphadenopathy, no thyromegaly, no masses, normal ROM, no bruits Chest: non tender, normal shape and expansion Heart: RRR, normal S1, S2, no murmurs Lungs: CTA bilaterally, no wheezes, rhonchi, or rales Abdomen: +bs, soft, non tender, non distended, no masses, no hepatomegaly, no splenomegaly, no bruits Back: non tender, normal ROM, no scoliosis Musculoskeletal: upper extremities non tender, no obvious deformity, normal ROM throughout, lower extremities non tender, no obvious deformity, normal ROM throughout Extremities: no edema, no cyanosis, no clubbing Pulses: 2+ symmetric, upper and lower extremities, normal cap refill Neurological: alert, oriented x 3, CN2-12 intact, strength normal upper extremities and lower extremities, sensation normal throughout, DTRs 2+ throughout, no cerebellar signs, gait normal Psychiatric: normal affect, behavior normal, pleasant  Breast/gyn/rectal - deferred   Assessment and Plan :    Encounter Diagnoses  Name Primary?  . Encounter for health maintenance examination in adult Yes  . Influenza vaccination declined   . Hyperlipidemia, unspecified hyperlipidemia type   . Type 2 diabetes mellitus with complication, without long-term current use of insulin ([place])   . Obesity (BMI 30-39.9)   . VSD (ventricular septal defect)   . Heart murmur   . Acanthosis    Physical exam - discussed healthy lifestyle, diet, exercise,  preventative care, vaccinations, and addressed their concerns.  Handout given. See your eye doctor yearly for routine vision care. See your dentist yearly for routine dental care including hygiene visits twice yearly. See your gynecologist yearly for routine gynecological care. Given murmur, on OCPs now, will send back to cardiology for recheck on VSD, murmur, and consideration of safety of contraception options. diabetes - labs today, discussed diet, exercise, glucose monitoring, daily feet checks, yearly eye exams Follow-up pending labs  Delight was seen today for physical.  Diagnoses and all orders for this visit:  Encounter for health maintenance examination in adult -     HM DIABETES EYE EXAM -     HM DIABETES FOOT EXAM -     Hemoglobin A1c -     Microalbumin / creatinine urine ratio -     Comprehensive metabolic panel -     CBC -     Lipid panel -     Ambulatory referral to Cardiology  Influenza vaccination declined  Hyperlipidemia, unspecified hyperlipidemia type -     Lipid panel  Type 2 diabetes mellitus with complication, without long-term current use of insulin ([facility]) -     HM DIABETES EYE EXAM -     HM DIABETES FOOT EXAM -     Hemoglobin A1c -     Microalbumin / creatinine  urine ratio  Obesity (BMI 30-39.9)  VSD (ventricular septal defect) -     Ambulatory referral to Cardiology  Heart murmur -     Ambulatory referral to Cardiology  Acanthosis

## 2016-03-13 NOTE — Addendum Note (Signed)
Addended by: Tyrone Apple on: 03/13/2016 12:02 PM   Modules accepted: Orders

## 2016-03-14 ENCOUNTER — Other Ambulatory Visit: Payer: Self-pay | Admitting: Medical

## 2016-03-14 DIAGNOSIS — E119 Type 2 diabetes mellitus without complications: Secondary | ICD-10-CM

## 2016-03-14 LAB — MICROALBUMIN / CREATININE URINE RATIO
Creatinine, Urine: 170 mg/dL (ref 20–320)
Microalb Creat Ratio: 16 mcg/mg creat (ref <30)
Microalb, Ur: 2.7 mg/dL

## 2016-03-14 MED ORDER — METFORMIN HCL 500 MG PO TABS: 500.0000 mg | ORAL_TABLET | Freq: Two times a day (BID) | ORAL | 3 refills | Status: DC

## 2016-04-19 DIAGNOSIS — E119 Type 2 diabetes mellitus without complications: Secondary | ICD-10-CM | POA: Diagnosis not present

## 2016-04-19 DIAGNOSIS — R011 Cardiac murmur, unspecified: Secondary | ICD-10-CM | POA: Diagnosis not present

## 2016-04-19 DIAGNOSIS — Z8774 Personal history of (corrected) congenital malformations of heart and circulatory system: Secondary | ICD-10-CM | POA: Diagnosis not present

## 2016-05-03 DIAGNOSIS — R011 Cardiac murmur, unspecified: Secondary | ICD-10-CM | POA: Diagnosis not present

## 2016-05-03 DIAGNOSIS — Z8774 Personal history of (corrected) congenital malformations of heart and circulatory system: Secondary | ICD-10-CM | POA: Diagnosis not present

## 2016-06-02 ENCOUNTER — Encounter: Payer: Self-pay | Admitting: Medical

## 2016-09-25 DIAGNOSIS — N3001 Acute cystitis with hematuria: Secondary | ICD-10-CM | POA: Diagnosis not present

## 2016-10-16 IMAGING — US US ABDOMEN COMPLETE
1 series · 14 of 25 positions shown · non-contrast
Comparison: None.

CLINICAL DATA: Upper abdominal pain, diabetes

EXAM:
ULTRASOUND ABDOMEN COMPLETE

[Series 1: us abdomen complete · 0.22mm/px · 14 of 73 slices shown]
[im 1/73]
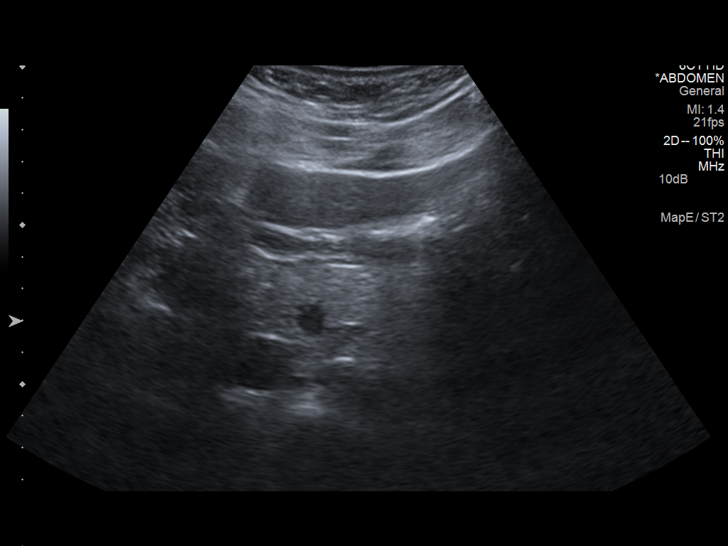
[im 7/73]
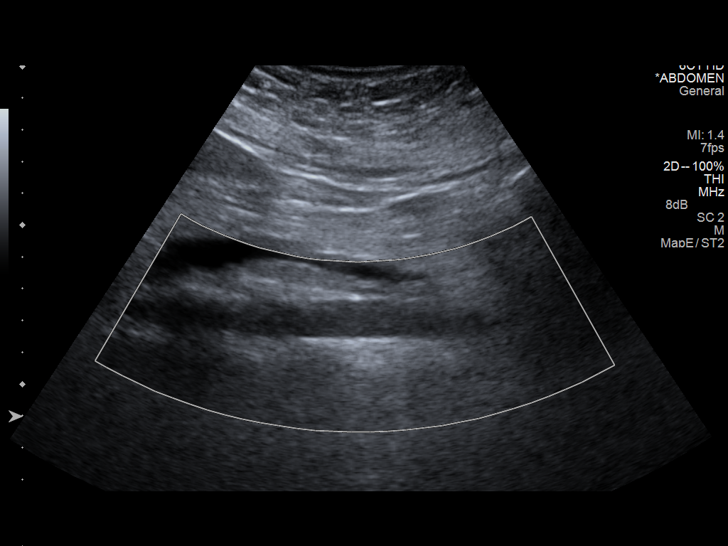
[im 13/73]
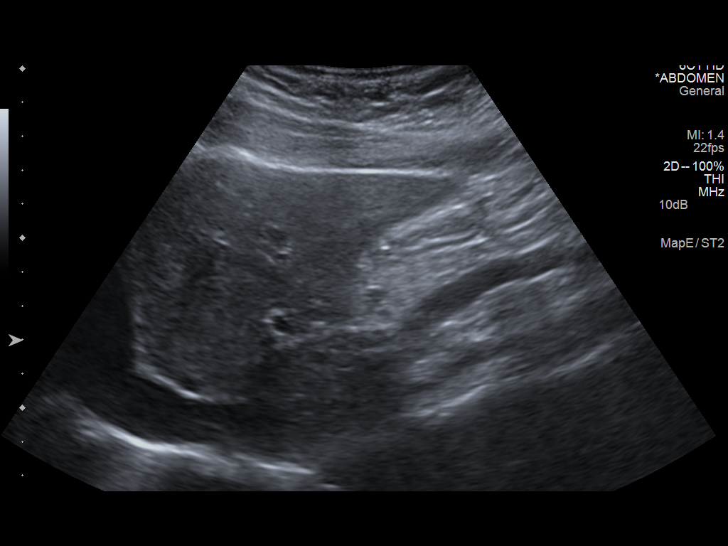
[im 19/73]
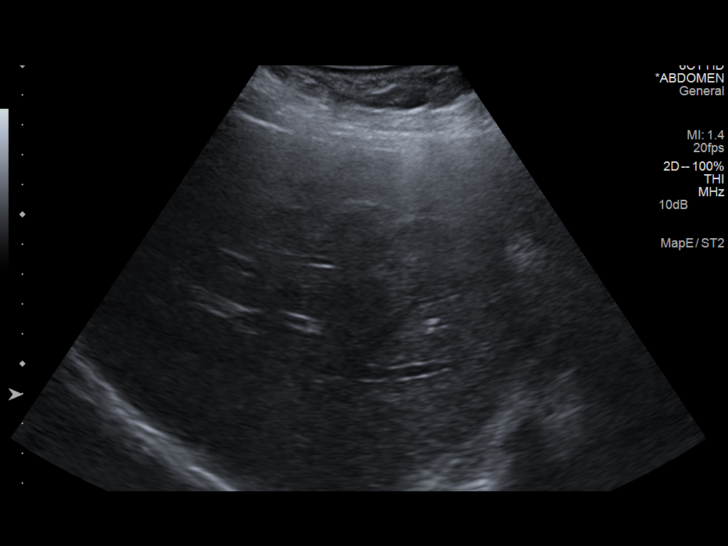
[im 25/73]
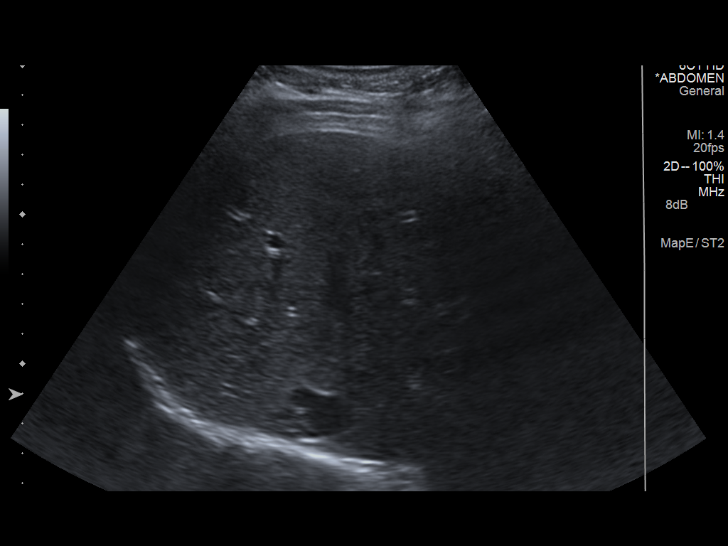
[im 28/73]
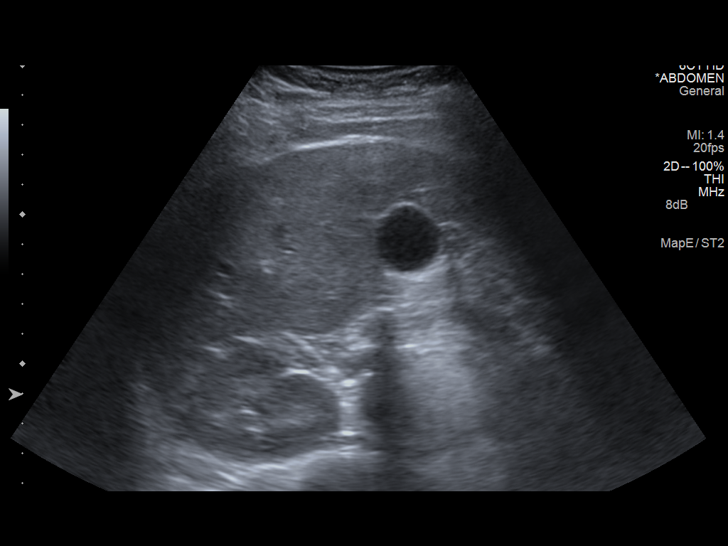
[im 34/73]
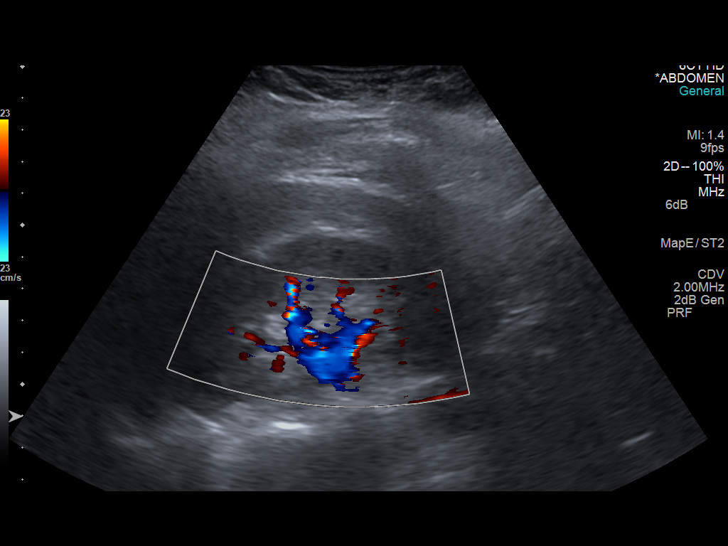
[im 40/73]
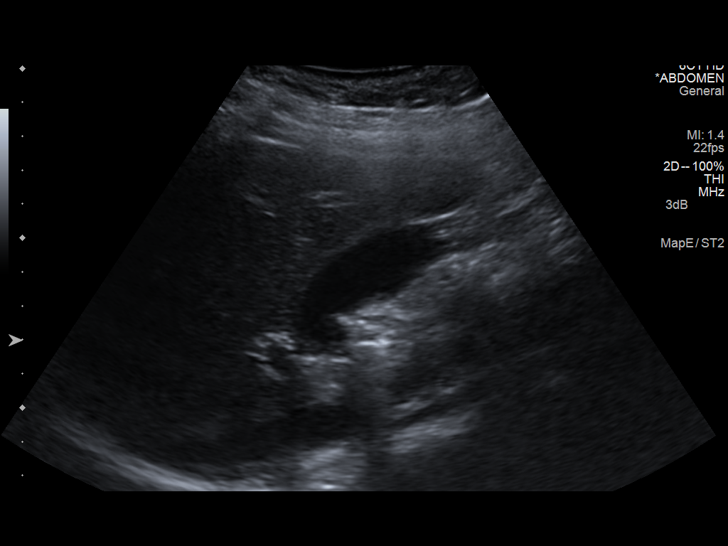
[im 46/73]
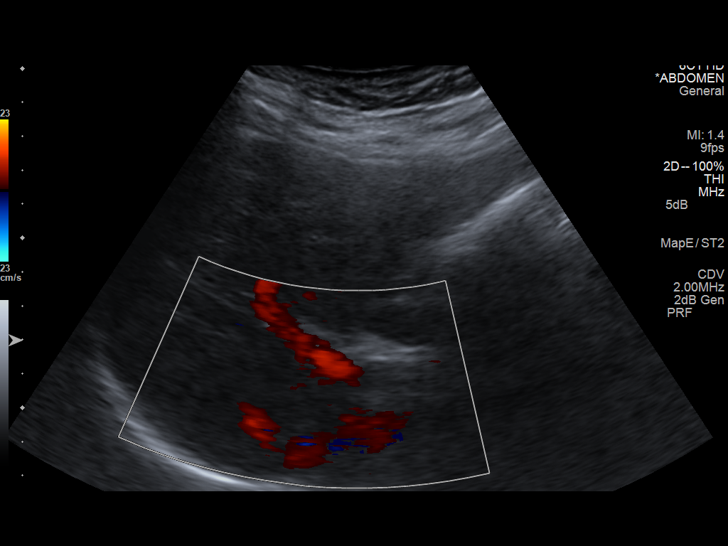
[im 49/73]
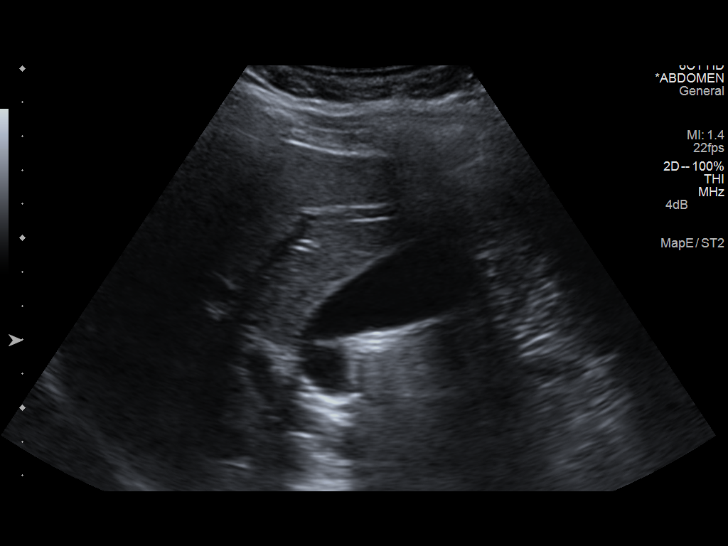
[im 55/73]
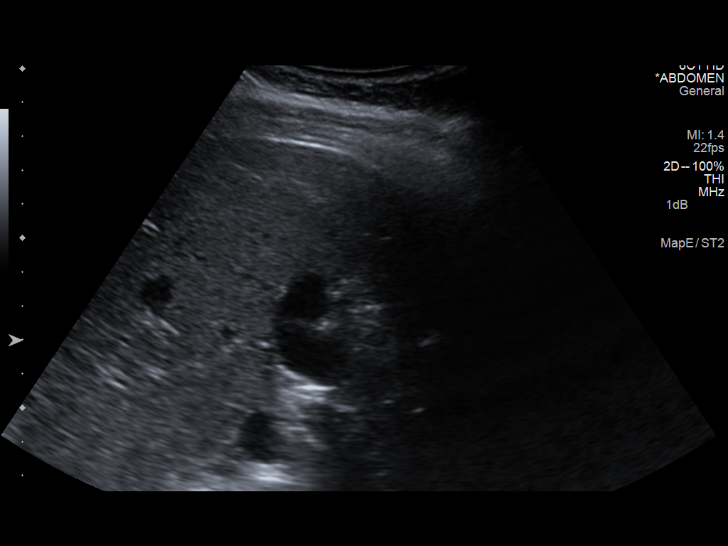
[im 61/73]
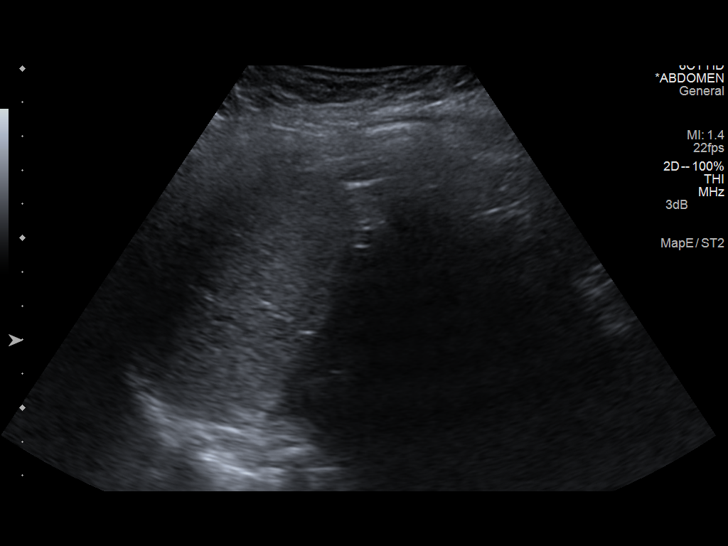
[im 67/73]
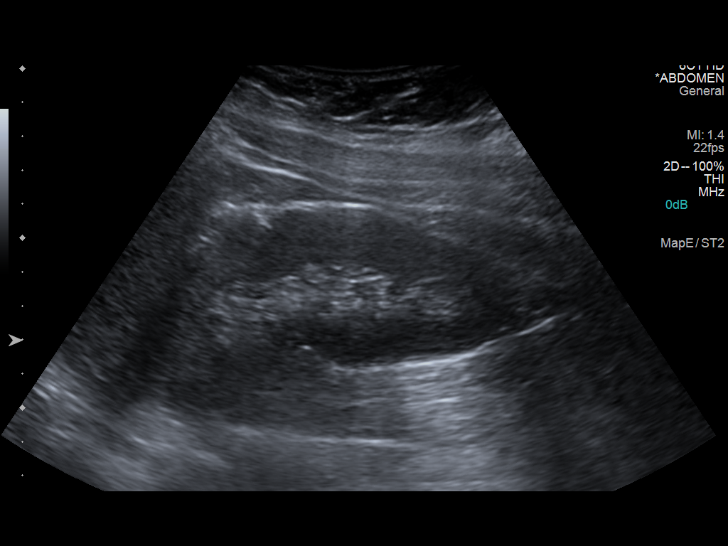
[im 73/73]
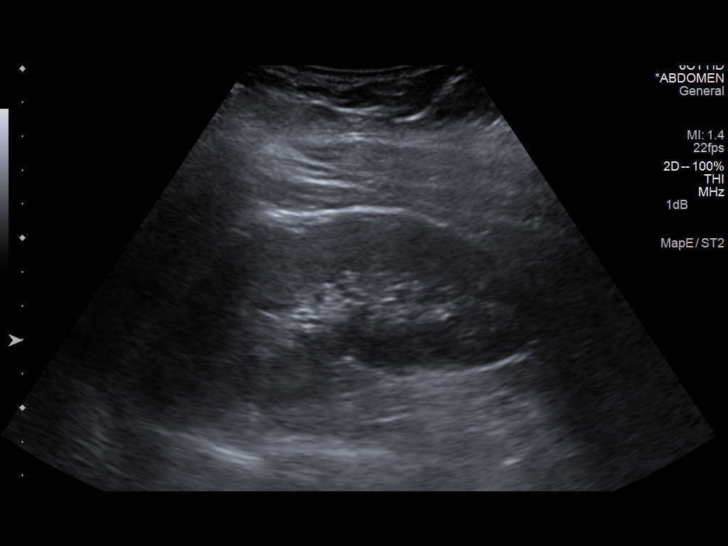

[14 of 25 positions shown; findings below may reference images not displayed]

FINDINGS: Gallbladder: No gallstones or wall thickening visualized. No
sonographic Murphy sign noted.

Common bile duct: Diameter: 3 mm in diameter within normal limits.

Liver: No focal lesion identified. Within normal limits in
parenchymal echogenicity.

IVC: No abnormality visualized.

Pancreas: Visualized portion unremarkable.

Spleen: Size and appearance within normal limits. Measures 7.6 cm in
length.

Right Kidney: Length: 10 cm. Echogenicity within normal limits. No
mass or hydronephrosis visualized.

Left Kidney: Length: 11.8 cm. Echogenicity within normal limits. No
mass or hydronephrosis visualized.

Abdominal aorta: No aneurysm visualized. Measures up to 1.7 cm in
diameter.

Other findings: None.
IMPRESSION: 1. Normal abdominal ultrasound.

## 2016-11-28 ENCOUNTER — Encounter: Payer: Self-pay | Admitting: Obstetrics and Gynecology

## 2017-02-02 ENCOUNTER — Ambulatory Visit (INDEPENDENT_AMBULATORY_CARE_PROVIDER_SITE_OTHER): Payer: Commercial Managed Care - PPO | Admitting: Obstetrics & Gynecology

## 2017-02-02 ENCOUNTER — Encounter: Payer: Self-pay | Admitting: Obstetrics & Gynecology

## 2017-02-02 VITALS — BP 122/64 | HR 72 | Resp 18 | Ht 60.75 in | Wt 188.0 lb

## 2017-02-02 DIAGNOSIS — Z202 Contact with and (suspected) exposure to infections with a predominantly sexual mode of transmission: Secondary | ICD-10-CM | POA: Diagnosis not present

## 2017-02-02 DIAGNOSIS — Z01419 Encounter for gynecological examination (general) (routine) without abnormal findings: Secondary | ICD-10-CM | POA: Diagnosis not present

## 2017-02-02 DIAGNOSIS — N912 Amenorrhea, unspecified: Secondary | ICD-10-CM | POA: Diagnosis not present

## 2017-02-02 DIAGNOSIS — E119 Type 2 diabetes mellitus without complications: Secondary | ICD-10-CM | POA: Diagnosis not present

## 2017-02-02 DIAGNOSIS — Z0000 Encounter for general adult medical examination without abnormal findings: Secondary | ICD-10-CM | POA: Diagnosis not present

## 2017-02-02 LAB — POCT URINE PREGNANCY: Preg Test, Ur: NEGATIVE

## 2017-02-02 NOTE — Progress Notes (Signed)
26 y.o. G1P0010 SingleAfrican AmericanF here for annual exam.  Pt is not sure when she took her pills last so is not sure of her LMP.  She does cycle when she takes her pills but does not like hormonal side effects.  Reports she is also not taking her metformin regularly due to diarrhea and abdominal pain with the medication.    Would like STD testing.        Sexually active: Yes.    The current method of family planning is none.    Exercising: Yes.    cardio Smoker:  no  Health Maintenance: Pap:  11/08/15 Neg   05/14/14 Neg  History of abnormal Pap:  no MMG:  Never TDaP:  06/2007 Gardasil: Done  Screening Labs: STD check   reports that she has never smoked. She has never used smokeless tobacco. She reports that she drinks about 0.6 oz of alcohol per week . She reports that she does not use drugs.  Past Medical History:  Diagnosis Date  . ADHD (attention deficit hyperactivity disorder)   . Chlamydia 11/11/2015  . Diabetes mellitus 2013  . H. pylori infection 1/16   off antibiotics at this time  . Hyperlipidemia   . Obesity   . Sickle cell trait ([place])   . VSD (ventricular septal defect)    followed by cardiology every 2 years  . Wears glasses     Past Surgical History:  Procedure Laterality Date  . DILATION AND EVACUATION N/A 08/05/2015   Procedure: DILATATION AND EVACUATION;  Surgeon: Marylynn Pearson, MD;  Location: [facility];  Service: Gynecology;  Laterality: N/A;  . WISDOM TOOTH EXTRACTION      Current Outpatient Prescriptions  Medication Sig Dispense Refill  . metFORMIN (GLUCOPHAGE) 500 MG tablet Take 1 tablet (500 mg total) by mouth 2 (two) times daily with a meal. 1 tablet po daily for diabetes 180 tablet 3  . norethindrone-ethinyl estradiol (JUNEL FE,GILDESS FE,LOESTRIN FE) 1-20 MG-MCG tablet Take 1 tablet by mouth daily. (Patient not taking: Reported on 02/02/2017) 1 Package 10   No current facility-administered medications for this visit.     Family History   Problem Relation Age of Onset  . Hypertension Mother   . Sickle cell trait Father   . Cancer Other        maternal side-? type  . Hyperlipidemia Other        ? side  . Stroke Other        paternal side  . Diabetes Other        paternal and maternal side  . Thyroid disease Paternal Aunt     ROS:  Pertinent items are noted in HPI.  Otherwise, a comprehensive ROS was negative.  Exam:   BP 122/64 (BP Location: Right Arm, Patient Position: Sitting, Cuff Size: Large)   Pulse 72   Resp 18   Ht 5' 0.75" (1.543 m)   Wt 188 lb (85.3 kg)   BMI 35.82 kg/m   Weight change: +8#  Height: 5' 0.75" (154.3 cm)  Ht Readings from Last 3 Encounters:  02/02/17 5' 0.75" (1.543 m)  03/13/16 5\' 1"  (1.549 m)  02/15/16 5' (1.524 m)   General appearance: alert, cooperative and appears stated age Head: Normocephalic, without obvious abnormality, atraumatic Neck: no adenopathy, supple, symmetrical, trachea midline and thyroid normal to inspection and palpation Lungs: clear to auscultation bilaterally Breasts: normal appearance, no masses or tenderness Heart: regular rate and rhythm Abdomen: soft, non-tender; bowel sounds normal; no masses,  no organomegaly Extremities: extremities normal, atraumatic, no cyanosis or edema Skin: Skin color, texture, turgor normal. No rashes or lesions Lymph nodes: Cervical, supraclavicular, and axillary nodes normal. No abnormal inguinal nodes palpated Neurologic: Grossly normal   Pelvic: External genitalia:  no lesions              Urethra:  normal appearing urethra with no masses, tenderness or lesions              Bartholins and Skenes: normal                 Vagina: normal appearing vagina with normal color and discharge, no lesions              Cervix: no lesions              Pap taken: No. Bimanual Exam:  Uterus:  normal size, contour, position, consistency, mobility, non-tender              Adnexa: normal adnexa and no mass, fullness, tenderness                Rectovaginal: Confirms               Anus:  normal sphincter tone, no lesions  Chaperone was present for exam.  A:  Well Woman with normal exam H/O secondary amenorrhea Diabetes  P:   Mammogram guidelines reviewed pap smear obtained today Referral to nutritional counseling HIV, RPR, GC/Chl obtained today Testosterone, free and total Lipids, HgA1C obtained today HCG obtained today.  (normal prolactin, FSH, TSH last summer at [facility]). Feel pt needs some sort of cycle regulation but will wait for lab work to return before making any recommendations. return annually or prn

## 2017-02-05 LAB — GC/CHLAMYDIA PROBE AMP
Chlamydia trachomatis, NAA: NEGATIVE
Neisseria gonorrhoeae by PCR: NEGATIVE

## 2017-02-06 LAB — LIPID PANEL
CHOL/HDL RATIO: 4.3 ratio (ref 0.0–4.4)
Cholesterol, Total: 184 mg/dL (ref 100–199)
HDL: 43 mg/dL (ref >39)
LDL Calculated: 126 mg/dL — ABNORMAL HIGH (ref 0–99)
TRIGLYCERIDES: 74 mg/dL (ref 0–149)
VLDL Cholesterol Cal: 15 mg/dL (ref 5–40)

## 2017-02-06 LAB — HEMOGLOBIN A1C
Est. average glucose Bld gHb Est-mCnc: 134 mg/dL
HEMOGLOBIN A1C: 6.3 % — AB (ref 4.8–5.6)

## 2017-02-06 LAB — TESTT+TESTF+SHBG
SEX HORMONE BINDING: 20.7 nmol/L — AB (ref 24.6–122.0)
TESTOSTERONE FREE: 3.5 pg/mL (ref 0.0–4.2)
TESTOSTERONE, TOTAL: 43.4 ng/dL (ref 10.0–55.0)

## 2017-02-06 LAB — RPR: RPR Ser Ql: NONREACTIVE

## 2017-02-06 LAB — HIV ANTIBODY (ROUTINE TESTING W REFLEX): HIV SCREEN 4TH GENERATION: NONREACTIVE

## 2017-02-06 LAB — BETA HCG QUANT (REF LAB)

## 2017-02-28 ENCOUNTER — Telehealth: Payer: Self-pay | Admitting: *Deleted

## 2017-02-28 NOTE — Telephone Encounter (Signed)
Call to patient. Message given to patient as seen below from Dr. Sabra Heck. Verbalized understanding. Patient questioning if she should still start the micronor because her "cycle started on its own." Patient states her period started on 02/14/17. She states she started the keto diet and since starting the diet had a normal cycle. RN advised would review with Dr. Sabra Heck and return call. Patient aware office will be closed for Thanksgiving holiday and will likely be next week before response.   Routing to provider for review.

## 2017-02-28 NOTE — Telephone Encounter (Signed)
-----   Message from Megan Salon, MD sent at 02/28/2017  1:09 PM EST ----- Please make sure pt is aware HIV, RPR, GC/Chl were negative.  Hormonal testing was negative.  HbA1C was elevated and she has been referred to nutritional counseling. This is scheduled for 12/6.  She should already be aware of this.  Cholestrol was mildly elevated.    She has tried Evra patch and combination OCPs with side effects.  I'd like her to try micronor (no estrogen so this should help the side effects).  Ok to call into pharmacy.  Ok to start with cycle.  Recheck 3 months.

## 2017-03-02 NOTE — Telephone Encounter (Signed)
I think ok to monitor.  If skips cycle for more than 90 days, she needs to call and let me know.  Diet changes and weight loss will likely help cycle be more regular.  Thanks.

## 2017-03-05 NOTE — Telephone Encounter (Signed)
Detailed message left per DPR with message from Dr. Sabra Heck as seen below. Advised patient to return call if any additional questions.   Encounter closed.

## 2017-03-15 ENCOUNTER — Encounter: Payer: Self-pay | Admitting: Registered"

## 2017-03-15 ENCOUNTER — Encounter: Payer: Commercial Managed Care - PPO | Attending: Medical | Admitting: Registered"

## 2017-03-15 DIAGNOSIS — Z713 Dietary counseling and surveillance: Secondary | ICD-10-CM | POA: Diagnosis not present

## 2017-03-15 DIAGNOSIS — E118 Type 2 diabetes mellitus with unspecified complications: Secondary | ICD-10-CM

## 2017-03-15 DIAGNOSIS — E119 Type 2 diabetes mellitus without complications: Secondary | ICD-10-CM | POA: Insufficient documentation

## 2017-03-15 NOTE — Progress Notes (Signed)
Diabetes Self-Management Education  Visit Type: First/Initial  Appt. Start Time: 0900 Appt. End Time: 1000  03/15/2017  [name], identified by name and date of birth, is a 26 y.o. female with a diagnosis of Diabetes: Type 2.   ASSESSMENT Patient states over the last 3 years she has been improving her diet and lifestyle and in the last 3 weeks has been following a keto diet cutting out starches and sugars and has lost some weight.  Patient states she is very busy with school and work, but has plenty of energy (8-9 out of 10) and able to get ~8 hrs of sleep and regular exercise.  Diabetes Self-Management Education - 03/15/17 0910      Visit Information   Visit Type  First/Initial      Initial Visit   Diabetes Type  Type 2    Are you currently following a meal plan?  Yes    What type of meal plan do you follow?  keto    Are you taking your medications as prescribed?  Not on Medications    Date Diagnosed  2013      Health Coping   How would you rate your overall health?  Fair      Psychosocial Assessment   Patient Belief/Attitude about Diabetes  Motivated to manage diabetes    How often do you need to have someone help you when you read instructions, pamphlets, or other written materials from your doctor or pharmacy?  1 - Never    What is the last grade level you completed in school?  Associates degree      Complications   Last HgB A1C per patient/outside source  6.3 % 1 month ago    How often do you check your blood sugar?  1-2 times/day    Fasting Blood glucose range (mg/dL)  70-129    Postprandial Blood glucose range (mg/dL)  70-129    Number of hypoglycemic episodes per month  0    Number of hyperglycemic episodes per week  0    Have you had a dilated eye exam in the past 12 months?  Yes    Have you had a dental exam in the past 12 months?  No    Are you checking your feet?  Yes    How many days per week are you checking your feet?  1      Dietary Intake   Breakfast  eggs, bacon    Snack (morning)  none    Lunch  chicken OR salad OR burgers without bun    Snack (afternoon)  cheese stick, pepperoni     Dinner  beef or chicken, OR bacon, eggs, veggies    Snack (evening)  none    Beverage(s)  water      Exercise   Exercise Type  Moderate (swimming / aerobic walking) additional 15 min walk daily    How many days per week to you exercise?  2    How many minutes per day do you exercise?  90    Total minutes per week of exercise  180      Patient Education   Previous Diabetes Education  No previous DSME note in chart when diagnosed in 2013, but pt didn't remember    Nutrition management   Role of diet in the treatment of diabetes and the relationship between the three main macronutrients and blood glucose level;Food label reading, portion sizes and measuring food.    Physical activity  and exercise   Role of exercise on diabetes management, blood pressure control and cardiac health.    Monitoring  Identified appropriate SMBG and/or A1C goals.      Individualized Goals (developed by patient)   Nutrition  General guidelines for healthy choices and portions discussed    Physical Activity  Exercise 3-5 times per week    Reducing Risk  increase portions of nuts and seeds      Outcomes   Expected Outcomes  Demonstrated interest in learning. Expect positive outcomes    Future DMSE  PRN    Program Status  Completed     Individualized Plan for Diabetes Self-Management Training:   Learning Objective:  Patient will have a greater understanding of diabetes self-management. Patient education plan is to attend individual and/or group sessions per assessed needs and concerns.  Patient Instructions  Consider having fish 2-3 x week Consider having a handful of nuts each day Consider adding more beans in your diet  Expected Outcomes:  Demonstrated interest in learning. Expect positive outcomes  Education material provided: Living Well with Diabetes,  A1C conversion sheet and My Plate, Fish guide  If problems or questions, patient to contact team via:  Phone  Future DSME appointment: PRN

## 2017-03-15 NOTE — Patient Instructions (Signed)
Consider having fish 2-3 x week Consider having a handful of nuts each day Consider adding more beans in your diet

## 2017-03-20 ENCOUNTER — Other Ambulatory Visit: Payer: Self-pay

## 2017-03-20 ENCOUNTER — Telehealth: Payer: Self-pay | Admitting: Family Medicine

## 2017-03-20 NOTE — Telephone Encounter (Signed)
Pt called for Accucheck Aviva plus test strips [place].  She is checking it 6 times a day.  She just found out she is pregnant.

## 2017-03-20 NOTE — Telephone Encounter (Signed)
Made an appt for a med check.

## 2017-03-20 NOTE — Telephone Encounter (Signed)
Needs f/u appt here for diabetes recheck

## 2017-03-21 ENCOUNTER — Other Ambulatory Visit: Payer: Self-pay

## 2017-03-29 ENCOUNTER — Telehealth: Payer: Self-pay | Admitting: Medical

## 2017-03-29 ENCOUNTER — Other Ambulatory Visit: Payer: Self-pay

## 2017-03-29 ENCOUNTER — Ambulatory Visit (INDEPENDENT_AMBULATORY_CARE_PROVIDER_SITE_OTHER): Payer: Commercial Managed Care - PPO | Admitting: Medical

## 2017-03-29 ENCOUNTER — Encounter: Payer: Self-pay | Admitting: Medical

## 2017-03-29 VITALS — BP 132/80 | HR 72 | Wt 182.4 lb

## 2017-03-29 DIAGNOSIS — Z3490 Encounter for supervision of normal pregnancy, unspecified, unspecified trimester: Secondary | ICD-10-CM

## 2017-03-29 DIAGNOSIS — E785 Hyperlipidemia, unspecified: Secondary | ICD-10-CM

## 2017-03-29 DIAGNOSIS — E669 Obesity, unspecified: Secondary | ICD-10-CM | POA: Diagnosis not present

## 2017-03-29 DIAGNOSIS — E118 Type 2 diabetes mellitus with unspecified complications: Secondary | ICD-10-CM | POA: Diagnosis not present

## 2017-03-29 DIAGNOSIS — Z2821 Immunization not carried out because of patient refusal: Secondary | ICD-10-CM

## 2017-03-29 MED ORDER — GLUCOSE BLOOD VI STRP: ORAL_STRIP | 12 refills | Status: DC

## 2017-03-29 NOTE — Telephone Encounter (Signed)
Pt called and stated she forgot to mention that she needs test strips. Pt uses Aviva Plus. Please sent to [facility]. Pt can be reached at [phone].

## 2017-03-29 NOTE — Telephone Encounter (Signed)
done

## 2017-03-29 NOTE — Telephone Encounter (Signed)
pls call in testing supplies, #100 with 11 refills.

## 2017-03-29 NOTE — Progress Notes (Signed)
Subjective: Chief Complaint  Patient presents with  . med check    med check    Here for med check.  Last visit here over a year ago.  She has a history of borderline diabetes, insulin resistance, obesity, hyperlipidemia.  She recently found out she was pregnant through a home pregnancy test.  Last menstrual period 11/7.  She saw her gynecologist in October, but has already been referred to an obstetrician.  She is not had her first consult yet.  She is using a prenatal vitamin.  She is a non-smoker.  She has prior reaction to flu shot and refuses flu vaccine.  When she saw  When she found the positive pregnancy test, she started checking her blood sugars 6 times a day, but her numbers fasting are under 120, and not seeing any elevated numbers.  She recently saw a nutritionist, had been using keto diet.  She is exercising.  She did not know she was pregnant at the time of the consult with a nutritionist.    Overall feels fine, doing well in general   Past Medical History:  Diagnosis Date  . ADHD (attention deficit hyperactivity disorder)   . Chlamydia 11/11/2015  . Diabetes mellitus 2013  . H. pylori infection 1/16   off antibiotics at this time  . Hyperlipidemia   . Obesity   . Sickle cell trait ([place])   . VSD (ventricular septal defect)    followed by cardiology every 2 years  . Wears glasses    No current outpatient medications on file prior to visit.   No current facility-administered medications on file prior to visit.    ROS as in subjective   Objective: BP 132/80   Pulse 72   Wt 182 lb 6.4 oz (82.7 kg)   SpO2 99%   BMI 34.75 kg/m   Wt Readings from Last 3 Encounters:  03/29/17 182 lb 6.4 oz (82.7 kg)  02/02/17 188 lb (85.3 kg)  03/13/16 182 lb 3.2 oz (82.6 kg)   BP Readings from Last 3 Encounters:  03/29/17 132/80  02/02/17 122/64  03/13/16 122/80    General appearance: alert, no distress, WD/WN,  Oral cavity: MMM, no lesions Neck: supple, no  lymphadenopathy, no thyromegaly, no masses, no bruits Heart: RRR, normal S1, S2, no murmurs Lungs: CTA bilaterally, no wheezes, rhonchi, or rales Abdomen: +bs, soft, non tender, non distended, no masses, no hepatomegaly, no splenomegaly Pulses: 2+ symmetric, upper and lower extremities, normal cap refill Ext no edema  Diabetic Foot Exam - Simple   Simple Foot Form Diabetic Foot exam was performed with the following findings:  Yes 03/29/2017 11:13 AM  Visual Inspection No deformities, no ulcerations, no other skin breakdown bilaterally:  Yes Sensation Testing Intact to touch and monofilament testing bilaterally:  Yes Pulse Check Posterior Tibialis and Dorsalis pulse intact bilaterally:  Yes Comments       Assessment: Encounter Diagnoses  Name Primary?  . Type 2 diabetes mellitus with complication, without long-term current use of insulin ([place]) Yes  . Pregnancy, unspecified gestational age   . Influenza vaccination declined   . Obesity (BMI 30-39.9)   . Hyperlipidemia, unspecified hyperlipidemia type      Plan: We discussed her medical history, recent regnancy positive finding, and spent the bulk of the visit discussing routine care for diabetes.  She has been for the most part borderline diabetic for couple years now.  We discussed the importance of routine follow-up at least every 6 months here, seeing  her eye doctor and dentist yearly, doing daily foot checks, we discussed proper glucose monitoring, spent a lot of time talking about diet and exercise.  I reviewed the recent labs in the chart record as well as her recent gynecology and nutritionist consult visit notes.  Labs today as below.  Gave the following recommendations as discussed today.  She is diet controlled diabetes currently.  Continue prenatal vitamin, follow-up with obstetrician as planned.  Patient Instructions  Recommendations:  See your eye doctor yearly for routine vision care.  See your dentist yearly for  routine dental care including hygiene visits twice yearly.  Check your feet daily for wounds/sores.  Recheck if you have a wound that is not healing well  Check your sugar fasting daily and at least 3 times weekly, check 2 hours after a meal to make sure sugars <200.    Follow up with the obstetrician  Make sure you are taking a prenatal vitamin  Exercise daily  Add some small portions of whole grains such as whole grain pasta or oatmeal such as 3/4 cup serving twice daily  Eat more beans, nuts, fish, but don't eat meat every meal  Limit beef and pork moreso than chicken and [place]  Avoid junk food, sweets, fried foods, cakes, candy, donuts, etc.  Drink mostly water  We will call with lab results.    Lynn Tucker was seen today for med check.  Diagnoses and all orders for this visit:  Type 2 diabetes mellitus with complication, without long-term current use of insulin ([facility])  Pregnancy, unspecified gestational age -     hCG, quantitative, pregnancy  Influenza vaccination declined  Obesity (BMI 30-39.9)  Hyperlipidemia, unspecified hyperlipidemia type

## 2017-03-29 NOTE — Patient Instructions (Signed)
Recommendations:  See your eye doctor yearly for routine vision care.  See your dentist yearly for routine dental care including hygiene visits twice yearly.  Check your feet daily for wounds/sores.  Recheck if you have a wound that is not healing well  Check your sugar fasting daily and at least 3 times weekly, check 2 hours after a meal to make sure sugars <200.    Follow up with the obstetrician  Make sure you are taking a prenatal vitamin  Exercise daily  Add some small portions of whole grains such as whole grain pasta or oatmeal such as 3/4 cup serving twice daily  Eat more beans, nuts, fish, but don't eat meat every meal  Limit beef and pork moreso than chicken and [place]  Avoid junk food, sweets, fried foods, cakes, candy, donuts, etc.  Drink mostly water  We will call with lab results.

## 2017-03-30 LAB — HCG, QUANTITATIVE, PREGNANCY: HCG, Total, QN: 10535 m[IU]/mL

## 2017-04-11 DIAGNOSIS — N911 Secondary amenorrhea: Secondary | ICD-10-CM | POA: Diagnosis not present

## 2017-04-11 DIAGNOSIS — Z3201 Encounter for pregnancy test, result positive: Secondary | ICD-10-CM | POA: Diagnosis not present

## 2017-04-20 DIAGNOSIS — O200 Threatened abortion: Secondary | ICD-10-CM | POA: Diagnosis not present

## 2017-04-20 DIAGNOSIS — O26891 Other specified pregnancy related conditions, first trimester: Secondary | ICD-10-CM | POA: Diagnosis not present

## 2017-04-20 DIAGNOSIS — O021 Missed abortion: Secondary | ICD-10-CM | POA: Diagnosis not present

## 2017-04-23 DIAGNOSIS — O200 Threatened abortion: Secondary | ICD-10-CM | POA: Diagnosis not present

## 2017-04-25 ENCOUNTER — Inpatient Hospital Stay (HOSPITAL_COMMUNITY): Payer: Commercial Managed Care - PPO | Admitting: Anesthesiology

## 2017-04-25 ENCOUNTER — Encounter (HOSPITAL_COMMUNITY): Payer: Self-pay | Admitting: *Deleted

## 2017-04-25 ENCOUNTER — Ambulatory Visit (HOSPITAL_COMMUNITY)
Admission: AD | Admit: 2017-04-25 | Discharge: 2017-04-25 | Disposition: A | Discharge status: Home / Self Care | Payer: Commercial Managed Care - PPO | Source: Ambulatory Visit | Attending: Obstetrics and Gynecology | Admitting: Obstetrics and Gynecology

## 2017-04-25 ENCOUNTER — Encounter (HOSPITAL_COMMUNITY)
Admission: AD | Disposition: A | Discharge status: Home / Self Care | Payer: Self-pay | Source: Ambulatory Visit | Attending: Obstetrics and Gynecology

## 2017-04-25 DIAGNOSIS — Z79899 Other long term (current) drug therapy: Secondary | ICD-10-CM | POA: Insufficient documentation

## 2017-04-25 DIAGNOSIS — Z6834 Body mass index (BMI) 34.0-34.9, adult: Secondary | ICD-10-CM | POA: Insufficient documentation

## 2017-04-25 DIAGNOSIS — F909 Attention-deficit hyperactivity disorder, unspecified type: Secondary | ICD-10-CM | POA: Insufficient documentation

## 2017-04-25 DIAGNOSIS — O020 Blighted ovum and nonhydatidiform mole: Secondary | ICD-10-CM | POA: Diagnosis not present

## 2017-04-25 DIAGNOSIS — E119 Type 2 diabetes mellitus without complications: Secondary | ICD-10-CM | POA: Diagnosis not present

## 2017-04-25 DIAGNOSIS — D573 Sickle-cell trait: Secondary | ICD-10-CM | POA: Insufficient documentation

## 2017-04-25 DIAGNOSIS — O019 Hydatidiform mole, unspecified: Secondary | ICD-10-CM | POA: Diagnosis not present

## 2017-04-25 DIAGNOSIS — E669 Obesity, unspecified: Secondary | ICD-10-CM | POA: Diagnosis not present

## 2017-04-25 DIAGNOSIS — Q210 Ventricular septal defect: Secondary | ICD-10-CM | POA: Insufficient documentation

## 2017-04-25 DIAGNOSIS — O021 Missed abortion: Secondary | ICD-10-CM | POA: Diagnosis not present

## 2017-04-25 DIAGNOSIS — O010 Classical hydatidiform mole: Secondary | ICD-10-CM | POA: Diagnosis not present

## 2017-04-25 DIAGNOSIS — E785 Hyperlipidemia, unspecified: Secondary | ICD-10-CM | POA: Diagnosis not present

## 2017-04-25 HISTORY — PX: DILATION AND EVACUATION: SHX1459

## 2017-04-25 HISTORY — DX: Blighted ovum and nonhydatidiform mole: O02.0

## 2017-04-25 LAB — CBC
HCT: 36.6 % (ref 36.0–46.0)
HEMOGLOBIN: 12.5 g/dL (ref 12.0–15.0)
MCH: 26.5 pg (ref 26.0–34.0)
MCHC: 34.2 g/dL (ref 30.0–36.0)
MCV: 77.5 fL — AB (ref 78.0–100.0)
Platelets: 432 10*3/uL — ABNORMAL HIGH (ref 150–400)
RBC: 4.72 MIL/uL (ref 3.87–5.11)
RDW: 14.4 % (ref 11.5–15.5)
WBC: 9.5 10*3/uL (ref 4.0–10.5)

## 2017-04-25 LAB — TYPE AND SCREEN
ABO/RH(D): A POS
Antibody Screen: NEGATIVE

## 2017-04-25 LAB — ABO/RH: ABO/RH(D): A POS

## 2017-04-25 SURGERY — DILATION AND EVACUATION, UTERUS
Anesthesia: Monitor Anesthesia Care | Site: Vagina

## 2017-04-25 MED ORDER — KETOROLAC TROMETHAMINE 30 MG/ML IJ SOLN
INTRAMUSCULAR | Status: DC | PRN
  Administered 2017-04-25: 30 mg via INTRAVENOUS

## 2017-04-25 MED ORDER — LACTATED RINGERS IV SOLN
INTRAVENOUS | Status: DC
  Administered 2017-04-25: 19:00:00 via INTRAVENOUS

## 2017-04-25 MED ORDER — PROMETHAZINE HCL 25 MG/ML IJ SOLN: 6.2500 mg | INTRAMUSCULAR | Status: DC | PRN

## 2017-04-25 MED ORDER — MIDAZOLAM HCL 2 MG/2ML IJ SOLN
INTRAMUSCULAR | Status: AC
  Filled 2017-04-25: qty 2

## 2017-04-25 MED ORDER — LIDOCAINE HCL 2 % IJ SOLN
INTRAMUSCULAR | Status: DC | PRN
  Administered 2017-04-25: 10 mL

## 2017-04-25 MED ORDER — DEXAMETHASONE SODIUM PHOSPHATE 4 MG/ML IJ SOLN
INTRAMUSCULAR | Status: AC
  Filled 2017-04-25: qty 1

## 2017-04-25 MED ORDER — SCOPOLAMINE 1 MG/3DAYS TD PT72
MEDICATED_PATCH | TRANSDERMAL | Status: DC | PRN
  Administered 2017-04-25: 1 via TRANSDERMAL

## 2017-04-25 MED ORDER — ONDANSETRON HCL 4 MG/2ML IJ SOLN
INTRAMUSCULAR | Status: AC
  Filled 2017-04-25: qty 2

## 2017-04-25 MED ORDER — FAMOTIDINE IN NACL 20-0.9 MG/50ML-% IV SOLN
20.0000 mg | Freq: Once | INTRAVENOUS | Status: AC
  Administered 2017-04-25: 20 mg via INTRAVENOUS
  Filled 2017-04-25: qty 50

## 2017-04-25 MED ORDER — DEXTROSE 5 % IV SOLN
2.0000 g | INTRAVENOUS | Status: AC
  Administered 2017-04-25: 2 g via INTRAVENOUS
  Filled 2017-04-25: qty 2

## 2017-04-25 MED ORDER — IBUPROFEN 200 MG PO TABS: 600.0000 mg | ORAL_TABLET | Freq: Four times a day (QID) | ORAL | 1 refills | Status: DC | PRN

## 2017-04-25 MED ORDER — PROPOFOL 10 MG/ML IV BOLUS
INTRAVENOUS | Status: DC | PRN
  Administered 2017-04-25: 50 mg via INTRAVENOUS
  Administered 2017-04-25: 30 mg via INTRAVENOUS
  Administered 2017-04-25: 40 mg via INTRAVENOUS

## 2017-04-25 MED ORDER — OXYCODONE HCL 5 MG/5ML PO SOLN: 5.0000 mg | Freq: Once | ORAL | Status: DC | PRN

## 2017-04-25 MED ORDER — LIDOCAINE HCL (CARDIAC) 20 MG/ML IV SOLN
INTRAVENOUS | Status: DC | PRN
  Administered 2017-04-25: 40 mg via INTRAVENOUS

## 2017-04-25 MED ORDER — FENTANYL CITRATE (PF) 100 MCG/2ML IJ SOLN
INTRAMUSCULAR | Status: DC | PRN
  Administered 2017-04-25: 100 ug via INTRAVENOUS

## 2017-04-25 MED ORDER — OXYCODONE HCL 5 MG PO TABS: 5.0000 mg | ORAL_TABLET | Freq: Once | ORAL | Status: DC | PRN

## 2017-04-25 MED ORDER — ONDANSETRON HCL 4 MG/2ML IJ SOLN
INTRAMUSCULAR | Status: DC | PRN
  Administered 2017-04-25: 4 mg via INTRAVENOUS

## 2017-04-25 MED ORDER — KETOROLAC TROMETHAMINE 30 MG/ML IJ SOLN
INTRAMUSCULAR | Status: AC
  Filled 2017-04-25: qty 1

## 2017-04-25 MED ORDER — PROPOFOL 10 MG/ML IV BOLUS
INTRAVENOUS | Status: AC
  Filled 2017-04-25: qty 20

## 2017-04-25 MED ORDER — SCOPOLAMINE 1 MG/3DAYS TD PT72
MEDICATED_PATCH | TRANSDERMAL | Status: AC
  Filled 2017-04-25: qty 1

## 2017-04-25 MED ORDER — FENTANYL CITRATE (PF) 250 MCG/5ML IJ SOLN
INTRAMUSCULAR | Status: AC
  Filled 2017-04-25: qty 5

## 2017-04-25 MED ORDER — FENTANYL CITRATE (PF) 100 MCG/2ML IJ SOLN: 25.0000 ug | INTRAMUSCULAR | Status: DC | PRN

## 2017-04-25 MED ORDER — SOD CITRATE-CITRIC ACID 500-334 MG/5ML PO SOLN
30.0000 mL | Freq: Once | ORAL | Status: AC
  Administered 2017-04-25: 30 mL via ORAL
  Filled 2017-04-25: qty 15

## 2017-04-25 MED ORDER — LIDOCAINE HCL (CARDIAC) 20 MG/ML IV SOLN
INTRAVENOUS | Status: AC
Start: 2017-04-25 — End: 2017-04-25
  Filled 2017-04-25: qty 5

## 2017-04-25 MED ORDER — MIDAZOLAM HCL 2 MG/2ML IJ SOLN
INTRAMUSCULAR | Status: DC | PRN
  Administered 2017-04-25: 2 mg via INTRAVENOUS

## 2017-04-25 SURGICAL SUPPLY — 17 items
CATH ROBINSON RED A/P 16FR (CATHETERS) ×2 IMPLANT
DECANTER SPIKE VIAL GLASS SM (MISCELLANEOUS) ×2 IMPLANT
GLOVE BIO SURGEON STRL SZ 6.5 (GLOVE) ×2 IMPLANT
GLOVE BIOGEL PI IND STRL 7.0 (GLOVE) ×1 IMPLANT
GLOVE BIOGEL PI INDICATOR 7.0 (GLOVE) ×1
GOWN STRL REUS W/TWL LRG LVL3 (GOWN DISPOSABLE) ×4 IMPLANT
KIT BERKELEY 1ST TRIMESTER 3/8 (MISCELLANEOUS) ×2 IMPLANT
NS IRRIG 1000ML POUR BTL (IV SOLUTION) ×2 IMPLANT
PACK VAGINAL MINOR WOMEN LF (CUSTOM PROCEDURE TRAY) ×2 IMPLANT
PAD OB MATERNITY 4.3X12.25 (PERSONAL CARE ITEMS) ×2 IMPLANT
PAD PREP 24X48 CUFFED NSTRL (MISCELLANEOUS) ×2 IMPLANT
SET BERKELEY SUCTION TUBING (SUCTIONS) ×2 IMPLANT
TOWEL OR 17X24 6PK STRL BLUE (TOWEL DISPOSABLE) ×4 IMPLANT
VACURETTE 10 RIGID CVD (CANNULA) IMPLANT
VACURETTE 7MM CVD STRL WRAP (CANNULA) IMPLANT
VACURETTE 8 RIGID CVD (CANNULA) ×1 IMPLANT
VACURETTE 9 RIGID CVD (CANNULA) IMPLANT

## 2017-04-25 NOTE — MAU Note (Signed)
Urine in lab 

## 2017-04-25 NOTE — Brief Op Note (Signed)
04/25/2017  8:29 PM  PATIENT:  Lynn Tucker  27 y.o. female  PRE-OPERATIVE DIAGNOSIS:  molar pregnancy  POST-OPERATIVE DIAGNOSIS:  molar pregnancy  PROCEDURE:  Procedure(s): DILATATION AND EVACUATION (N/A)  SURGEON:  Surgeon(s) and Role:    * Bovard-Stuckert, Sharlyn Odonnel, MD - Primary  ANESTHESIA:   MAC  EBL:  50 mL   BLOOD ADMINISTERED:none  DRAINS: none   LOCAL MEDICATIONS USED:  LIDOCAINE   SPECIMEN:  Source of Specimen:  POC  DISPOSITION OF SPECIMEN:  PATHOLOGY  COUNTS:  YES  TOURNIQUET:  * No tourniquets in log *  DICTATION: .Other Dictation: Dictation Number [id number]  PLAN OF CARE: Discharge to home after PACU  PATIENT DISPOSITION:  PACU - hemodynamically stable.   Delay start of Pharmacological VTE agent (>24hrs) due to surgical blood loss or risk of bleeding: not applicable

## 2017-04-25 NOTE — Discharge Instructions (Addendum)
Remove scopolomine patch behind right ear in 24 hours-patch in to prevent nausea.

## 2017-04-25 NOTE — MAU Note (Signed)
Pt here for D&E for molar pregnancy

## 2017-04-25 NOTE — Anesthesia Preprocedure Evaluation (Addendum)
Anesthesia Evaluation  Patient identified by MRN, date of birth, ID band Patient awake    Reviewed: Allergy & Precautions, NPO status , Patient's Chart, lab work & pertinent test results  Airway Mallampati: II  TM Distance: >3 FB Neck ROM: Full    Dental  (+) Teeth Intact, Dental Advisory Given   Pulmonary neg pulmonary ROS,    breath sounds clear to auscultation       Cardiovascular  Rhythm:Regular Rate:Normal  VSD   Neuro/Psych PSYCHIATRIC DISORDERS negative neurological ROS     GI/Hepatic negative GI ROS, Neg liver ROS,   Endo/Other  diabetesObesity  Renal/GU negative Renal ROS  negative genitourinary   Musculoskeletal negative musculoskeletal ROS (+)   Abdominal   Peds  (+) ADHD Hematology  (+) Sickle cell trait ,   Anesthesia Other Findings   Reproductive/Obstetrics Molar pregnancy                            Anesthesia Physical  Anesthesia Plan  ASA: II  Anesthesia Plan: MAC   Post-op Pain Management:    Induction: Intravenous  PONV Risk Score and Plan: Propofol infusion, Treatment may vary due to age or medical condition and Ondansetron  Airway Management Planned: Simple Face Mask  Additional Equipment: None  Intra-op Plan:   Post-operative Plan:   Informed Consent: I have reviewed the patients History and Physical, chart, labs and discussed the procedure including the risks, benefits and alternatives for the proposed anesthesia with the patient or authorized representative who has indicated his/her understanding and acceptance.   Dental advisory given  Plan Discussed with: CRNA  Anesthesia Plan Comments:         Anesthesia Quick Evaluation

## 2017-04-25 NOTE — H&P (Signed)
Lynn Tucker is an 27 y.o. female G2P0 with early molar preg, [place] 1/11 with ? Vesicles, HCG = 59,373 - [place] had recheck HCG and [place] 1/16, HCG 68,377 and vesicles on [place].  D/w pt r/b/a of D&E and risk of transfusion.    Pertinent OB/Gynecological History: No h/o abn pap + Chl   G1 SAB G2 present - molar pregnancy     Past Medical History:  Diagnosis Date  . ADHD (attention deficit hyperactivity disorder)   . Chlamydia 11/11/2015  . Diabetes mellitus 2013  . H. pylori infection 1/16   off antibiotics at this time  . Hyperlipidemia   . Obesity   . Sickle cell trait ([place])   . VSD (ventricular septal defect)    followed by cardiology every 2 years  . Wears glasses     Past Surgical History:  Procedure Laterality Date  . DILATION AND EVACUATION N/A 08/05/2015   Procedure: DILATATION AND EVACUATION;  Surgeon: Marylynn Pearson, MD;  Location: [facility];  Service: Gynecology;  Laterality: N/A;  . WISDOM TOOTH EXTRACTION      Family History  Problem Relation Age of Onset  . Hypertension Mother   . Sickle cell trait Father   . Cancer Other        maternal side-? type  . Hyperlipidemia Other        ? side  . Stroke Other        paternal side  . Diabetes Other        paternal and maternal side  . Thyroid disease Paternal Aunt     Social History:  reports that  has never smoked. she has never used smokeless tobacco. She reports that she drinks about 0.6 oz of alcohol per week. She reports that she does not use drugs.married, long-term relationship  Allergies:  Allergies  Allergen Reactions  . Influenza Vaccines     Makes her sick  . Metformin And Related     Causes oily stool & cramps    Medications Prior to Admission  Medication Sig Dispense Refill Last Dose  . glucose blood (ACCU-CHEK AVIVA PLUS) test strip Pt is test blood sugar twice a daily 100 each 12     Review of Systems  Constitutional: Negative.   HENT: Negative.   Eyes: Negative.   Respiratory:  Negative.   Cardiovascular: Negative.   Gastrointestinal: Negative.   Genitourinary: Negative.   Musculoskeletal: Negative.   Skin: Negative.   Neurological: Negative.   Psychiatric/Behavioral: Negative.     There were no vitals taken for this visit. Physical Exam  Constitutional: She is oriented to person, place, and time. She appears well-developed and well-nourished.  HENT:  Head: Normocephalic and atraumatic.  Cardiovascular: Normal rate and regular rhythm.  Respiratory: Effort normal. No respiratory distress. She has no wheezes.  GI: Soft. Bowel sounds are normal. She exhibits no distension. There is no tenderness.  Musculoskeletal: Normal range of motion.  Neurological: She is alert and oriented to person, place, and time.  Skin: Skin is warm and dry.  Psychiatric: She has a normal mood and affect. Her behavior is normal.   [place] molar preg  Assessment/Plan: 27yo G2P0010 with molar pregnancy for D&C D/w pt r/b/a of surgery, risk of transfusion Will proceed at 8pm per NPO guidelines  Lynn Tucker 04/25/2017, 6:17 PM

## 2017-04-25 NOTE — Anesthesia Postprocedure Evaluation (Signed)
Anesthesia Post Note  Patient: Lynn Tucker  Procedure(s) Performed: DILATATION AND EVACUATION (N/A Vagina )     Patient location during evaluation: PACU Anesthesia Type: MAC Level of consciousness: awake and alert Pain management: pain level controlled Vital Signs Assessment: post-procedure vital signs reviewed and stable Respiratory status: spontaneous breathing, nonlabored ventilation and respiratory function stable Cardiovascular status: stable and blood pressure returned to baseline Anesthetic complications: no    Last Vitals:  Vitals:   04/25/17 2115 04/25/17 2130  BP: (!) 109/59 115/68  Pulse: 72 87  Resp:    Temp:  36.9 C  SpO2: 98% 100%    Last Pain:  Vitals:   04/25/17 2130  TempSrc:   PainSc: 2    Pain Goal:                 Audry Pili

## 2017-04-25 NOTE — Transfer of Care (Signed)
Immediate Anesthesia Transfer of Care Note  Patient: Lynn Tucker  Procedure(s) Performed: DILATATION AND EVACUATION (N/A Vagina )  Patient Location: PACU  Anesthesia Type:MAC  Level of Consciousness: awake, alert  and oriented  Airway & Oxygen Therapy: Patient Spontanous Breathing  Post-op Assessment: Report given to RN and Post -op Vital signs reviewed and stable  Post vital signs: Reviewed and stable SaO2 97%, HR 90, BP 122/67, RR 20  Last Vitals:  Vitals:   04/25/17 1818  BP: 124/69  Pulse: 73  Resp: 16  Temp: 37.1 C  SpO2: 100%    Last Pain:  Vitals:   04/25/17 1818  TempSrc: Oral         Complications: No apparent anesthesia complications

## 2017-04-26 ENCOUNTER — Encounter (HOSPITAL_COMMUNITY): Payer: Self-pay | Admitting: Obstetrics and Gynecology

## 2017-04-26 LAB — GLUCOSE, CAPILLARY: GLUCOSE-CAPILLARY: 83 mg/dL (ref 65–99)

## 2017-04-26 NOTE — Op Note (Signed)
NAME:  Lynn Tucker, Lynn Tucker                  ACCOUNT NO.:  MEDICAL RECORD NO.:  [id number]  LOCATION:                                 FACILITY:  PHYSICIAN:  Thornell Sartorius, MD             DATE OF BIRTH:  DATE OF PROCEDURE:  04/25/2017 DATE OF DISCHARGE:                              OPERATIVE REPORT   PREOPERATIVE DIAGNOSIS:  Molar pregnancy.  POSTOPERATIVE DIAGNOSIS:  Molar pregnancy.  PROCEDURE:  Suction, dilatation and evacuation.  SURGEON:  Thornell Sartorius, MD.  ANESTHESIA:  MAC.  ESTIMATED BLOOD LOSS:  50 mL.  URINE OUTPUT:  100 mL.  INTRAVENOUS FLUIDS:  Per the anesthesia notes.  COMPLICATIONS:  None.  PATHOLOGY:  Products of conception to Path.  Grossly vesicles are noted in the products of conception.  DESCRIPTION OF PROCEDURE:  After informed consent was reviewed with the patient including risks, benefits, and alternatives of the surgical procedure, she was transferred to the OR and placed on the table in supine position, then placed in the [place] stirrups.  MAC anesthesia was induced and found to be adequate.  She was then prepped and draped in the normal sterile fashion.  An open-sided speculum was used.  Her cervix was easily visualized after an exam in which her uterus was felt to be approximately a 10-week size and mid to anteverted.  A paracervical block was placed with 10 mL of 2% lidocaine in her cervix with approximately 2 mL at 12 o'clock where a single-toothed tenaculum was placed, 4 mL at both 5 and 8 o'clock.  The cervix was dilated to accommodate an 8-French curette to approximately 24 with Hegar dilators. The uterus was cleared of products of conception with suction and curettage with several passes.  A pass with a sharp curette was made. It was noted to be gritty in all 4 quadrants.  A final pass with the suction curette was performed.  Bleeding was within normal limits.  The patient was cleaned and the speculum was removed. She was returned to the  supine position and transferred in stable condition to the PACU.     Thornell Sartorius, MD     JB/MEDQ  D:  04/25/2017  T:  04/26/2017  Job:  [id number]

## 2017-05-07 DIAGNOSIS — O020 Blighted ovum and nonhydatidiform mole: Secondary | ICD-10-CM | POA: Diagnosis not present

## 2017-05-14 DIAGNOSIS — Z3201 Encounter for pregnancy test, result positive: Secondary | ICD-10-CM | POA: Diagnosis not present

## 2017-05-21 DIAGNOSIS — O020 Blighted ovum and nonhydatidiform mole: Secondary | ICD-10-CM | POA: Diagnosis not present

## 2017-05-25 ENCOUNTER — Ambulatory Visit: Payer: Self-pay | Admitting: Obstetrics & Gynecology

## 2017-05-25 ENCOUNTER — Telehealth: Payer: Self-pay | Admitting: Obstetrics & Gynecology

## 2017-05-25 NOTE — Telephone Encounter (Signed)
Patient canceled her 3 month recheck appointment today. Patient said "I am going through an emergency and cannot make it today". Patient said she would call later to reschedule once her situation has resolved.

## 2017-05-25 NOTE — Telephone Encounter (Signed)
Pt looks like she has transferred practices as well.  She is seeing providers at [facility].  That would make three different ob/gyn practices in the last two years.  Ok to send letter as well.  Routing to [employer].

## 2017-06-04 DIAGNOSIS — O020 Blighted ovum and nonhydatidiform mole: Secondary | ICD-10-CM | POA: Diagnosis not present

## 2017-06-05 ENCOUNTER — Encounter: Payer: Self-pay | Admitting: Obstetrics & Gynecology

## 2017-06-05 NOTE — Telephone Encounter (Signed)
I left a message for patient to call and reschedule. Okay to close encounter?

## 2017-06-06 NOTE — Telephone Encounter (Signed)
Yes, ok to close encounter. 

## 2017-06-11 DIAGNOSIS — O020 Blighted ovum and nonhydatidiform mole: Secondary | ICD-10-CM | POA: Diagnosis not present

## 2017-06-18 DIAGNOSIS — Z8759 Personal history of other complications of pregnancy, childbirth and the puerperium: Secondary | ICD-10-CM | POA: Diagnosis not present

## 2017-07-04 DIAGNOSIS — Z8759 Personal history of other complications of pregnancy, childbirth and the puerperium: Secondary | ICD-10-CM | POA: Diagnosis not present

## 2017-07-04 DIAGNOSIS — Z3040 Encounter for surveillance of contraceptives, unspecified: Secondary | ICD-10-CM | POA: Diagnosis not present

## 2017-08-01 DIAGNOSIS — Z8759 Personal history of other complications of pregnancy, childbirth and the puerperium: Secondary | ICD-10-CM | POA: Diagnosis not present

## 2017-08-30 DIAGNOSIS — Z8759 Personal history of other complications of pregnancy, childbirth and the puerperium: Secondary | ICD-10-CM | POA: Diagnosis not present

## 2017-09-28 ENCOUNTER — Telehealth: Payer: Self-pay

## 2017-09-28 NOTE — Telephone Encounter (Signed)
Left message on voicemail for patient to call back to schedule fasting cpe appointment.

## 2017-10-18 DIAGNOSIS — Z8759 Personal history of other complications of pregnancy, childbirth and the puerperium: Secondary | ICD-10-CM | POA: Diagnosis not present

## 2017-11-22 DIAGNOSIS — Z8759 Personal history of other complications of pregnancy, childbirth and the puerperium: Secondary | ICD-10-CM | POA: Diagnosis not present

## 2017-12-19 DIAGNOSIS — Z8759 Personal history of other complications of pregnancy, childbirth and the puerperium: Secondary | ICD-10-CM | POA: Diagnosis not present

## 2018-02-13 ENCOUNTER — Encounter (HOSPITAL_COMMUNITY): Payer: Self-pay

## 2018-03-20 DIAGNOSIS — Z01419 Encounter for gynecological examination (general) (routine) without abnormal findings: Secondary | ICD-10-CM | POA: Diagnosis not present

## 2018-03-20 DIAGNOSIS — Z6836 Body mass index (BMI) 36.0-36.9, adult: Secondary | ICD-10-CM | POA: Diagnosis not present

## 2018-03-20 DIAGNOSIS — Z1389 Encounter for screening for other disorder: Secondary | ICD-10-CM | POA: Diagnosis not present

## 2018-03-29 DIAGNOSIS — Z0000 Encounter for general adult medical examination without abnormal findings: Secondary | ICD-10-CM | POA: Diagnosis not present

## 2018-04-08 ENCOUNTER — Ambulatory Visit (INDEPENDENT_AMBULATORY_CARE_PROVIDER_SITE_OTHER): Payer: Commercial Managed Care - PPO | Admitting: Medical

## 2018-04-08 ENCOUNTER — Encounter: Payer: Self-pay | Admitting: Medical

## 2018-04-08 VITALS — BP 110/64 | HR 68 | Wt 188.4 lb

## 2018-04-08 DIAGNOSIS — R7303 Prediabetes: Secondary | ICD-10-CM | POA: Insufficient documentation

## 2018-04-08 DIAGNOSIS — E785 Hyperlipidemia, unspecified: Secondary | ICD-10-CM

## 2018-04-08 DIAGNOSIS — R7301 Impaired fasting glucose: Secondary | ICD-10-CM | POA: Insufficient documentation

## 2018-04-08 LAB — POCT GLYCOSYLATED HEMOGLOBIN (HGB A1C): HEMOGLOBIN A1C: 7 % — AB (ref 4.0–5.6)

## 2018-04-08 NOTE — Patient Instructions (Addendum)
Check to see if your gynecology has checked a hypercoagulable panel on you given history of 2 miscarriages.   We may medication to help control blood sugar.     exercise most days per week, at least 30 minutes or more    Breakfast You may eat 1 of the following  Smoothie with Almond milk, handful of kale or spinach, and 1-2 fruit servings of your choice such as berries or 1/2 banana  Whole grain slice of toast and thin layer of low sugar jam or small amount of honey  Whole grain slice of toast and avocado spread  1/2 cup of steel cut oats (oatmeal)   Mid-morning snack 1 fruit serving such as one of the following:  medium-sized apple  medium-sized orange,  Tangerine  1/2 banana   3/4 cup of fresh berries or frozen berries  A protein source such as one of the following:  8 almonds   small handful of walnuts or other nuts  Hummus and vegetable such as carrots   Lunch A protein source such as 1 of the following: . 1 serving of beans such as black beans, pinto beans, green beans, or edamame (soy beans) . Veggie burger  . Non breaded fish such as salmon or tuna, either baked, grilled, or broiled Vegetable - Half of your plate should be a non-starchy vegetables!  So avoid white potatoes and corn.  Otherwise, eat a large portion of vegetables. . Avocado, cucumber, tomato, carrots, greens, lettuce, squash, okra, etc.  . Vegetables can include salad with olive oil/vinaigrette dressing Grains such as 1/2 cup of brown rice, quinoa, barley or other whole grain or 1 or 2 slices of whole grain bread   Mid-afternoon snack 1 fruit serving such as one of the following:  medium-sized apple  medium-sized orange,  Tangerine  1/2 banana   3/4 cup of fresh berries or frozen berries  A protein source such as one of the following:  8 almonds   small handful of walnuts or other nuts  Hummus and vegetable such as carrots   Dinner A protein source such as 1 of the  following: . 1 serving of beans such as black beans, pinto beans, green beans, or edamame (soy beans) . Veggie burger  . Non breaded fish such as salmon or tuna, either baked, grilled, or broiled Vegetable - Half of your plate should be a non-starchy vegetables!  So avoid white potatoes and corn.  Otherwise, eat a large portion of vegetables. . Avocado, cucumber, tomato, carrots, greens, lettuce, squash, okra, etc.  . Vegetables can include salad with olive oil/vinaigrette dressing Grains such as 1/2 cup of brown rice, quinoa, barley or other whole grain or 1 or 2 slices of whole grain bread   Beverages: Water Unsweet tea Home made juice with a juicer without sugar added other than small bit of honey or agave nectar Water with sugar free flavor such as [place]   AVOID.... For the time being I want you to cut out the following items completely: . Soda, sweet tea, juice, beer or wine or alcohol . Sweets such as cake, candy, pies, chips, cookies, chocolate

## 2018-04-08 NOTE — Progress Notes (Signed)
Subjective: Chief Complaint  Patient presents with  . obgyn results    obgyn results- cholesterol, a1c- abnormal   Here for f/u.   Saw gynecology few weeks ago for routine f/u.  Labs were done and reportedly sugar and cholesterol were high.   Currently not taking any medications  Exercise - goes to the gym 3 times per week, just started back 3 weeks ago.  usually does treadmill and some weights.    She notes moderate exercise, a good sweat but not intense.     Prior to holidays was eating healthy, but during holidays, hasn't done well with diet.     Denies diabetes symptoms, no polyuria, no polydipsia, no other symptoms.  Feeling fine.   Saw eye doctor in August, and she notes eye doctor had no concerns then.     Past Medical History:  Diagnosis Date  . ADHD (attention deficit hyperactivity disorder)   . Chlamydia 11/11/2015  . H. pylori infection 1/16   off antibiotics at this time  . Hyperlipidemia   . Impaired fasting glucose    2013  . Molar pregnancy 04/25/2017  . Obesity   . Sickle cell trait ([place])   . VSD (ventricular septal defect)    followed by cardiology every 2 years  . Wears glasses    Current Outpatient Medications on File Prior to Visit  Medication Sig Dispense Refill  . glucose blood (ACCU-CHEK AVIVA PLUS) test strip Pt is test blood sugar twice a daily (Patient not taking: Reported on 04/08/2018) 100 each 12   No current facility-administered medications on file prior to visit.    ROS as in subjective   Objective: BP 110/64   Pulse 68   Wt 188 lb 6.4 oz (85.5 kg)   SpO2 99%   BMI 35.89 kg/m   Wt Readings from Last 3 Encounters:  04/08/18 188 lb 6.4 oz (85.5 kg)  04/25/17 179 lb 6.4 oz (81.4 kg)  03/29/17 182 lb 6.4 oz (82.7 kg)   General appearance: alert, no distress, WD/WN,  HEENT: normocephalic, sclerae anicteric, TMs pearly, nares patent, no discharge or erythema, pharynx normal Oral cavity: MMM, no lesions Neck: supple, no  lymphadenopathy, no thyromegaly, no masses Heart: RRR, normal S1, S2, no murmurs Lungs: CTA bilaterally, no wheezes, rhonchi, or rales Pulses: 2+ symmetric, upper and lower extremities, normal cap refill     Assessment: Encounter Diagnoses  Name Primary?  . Impaired fasting glucose Yes  . Prediabetes   . Hyperlipidemia, unspecified hyperlipidemia type      Plan: Discussed recent lab findings.  HgbA1C 7% today.  Counseled on careful diet, continue regular exercise, monitor glucose regularly.  Consider vegan diet.   F/u 34mo.  Reviewed recent labs in [facility], labs from 03/29/18 visit show normal CMET, total cholesterol 190, HDL 34, LDL 136, glucose was 111,   Montia was seen today for obgyn results.  Diagnoses and all orders for this visit:  Impaired fasting glucose -     HgB A1c  Prediabetes  Hyperlipidemia, unspecified hyperlipidemia type

## 2018-04-10 NOTE — L&D Delivery Note (Signed)
Delivery Note Pt pushed very well for 30 minutes for delivery.  At 12:52 AM a viable and healthy female was delivered via Vaginal, Spontaneous (Presentation: OA; ROT ).  APGAR: 8, 9; weight  P.   Placenta status: delivered, intact .  Cord: 3V with the following complications: none.    Anesthesia:  epidural Episiotomy: None Lacerations: Periurethral Suture Repair: 3.0 vicryl rapide Est. Blood Loss (mL): 101cc  Mom to postpartum.  Baby to Couplet care / Skin to Skin.  Lynn Tucker 01/31/2019, 1:45 AM  Br/RI/Tdap in PNC/ A+/Contra?  Desires circumcision for female infant - d/w pt r/b/a

## 2018-04-11 ENCOUNTER — Encounter: Payer: Self-pay | Admitting: Medical

## 2018-04-25 ENCOUNTER — Encounter: Payer: Self-pay | Admitting: Obstetrics & Gynecology

## 2018-04-25 ENCOUNTER — Ambulatory Visit: Payer: Commercial Managed Care - PPO | Admitting: Obstetrics & Gynecology

## 2018-04-25 ENCOUNTER — Telehealth: Payer: Self-pay | Admitting: Obstetrics & Gynecology

## 2018-04-25 NOTE — Telephone Encounter (Addendum)
Patient called st 10:15 am to cancel her 10:45 am aex today. She stated that she has "changed doctors".

## 2018-05-14 DIAGNOSIS — R030 Elevated blood-pressure reading, without diagnosis of hypertension: Secondary | ICD-10-CM | POA: Diagnosis not present

## 2018-05-14 DIAGNOSIS — Z7984 Long term (current) use of oral hypoglycemic drugs: Secondary | ICD-10-CM | POA: Diagnosis not present

## 2018-05-14 DIAGNOSIS — M79675 Pain in left toe(s): Secondary | ICD-10-CM | POA: Diagnosis not present

## 2018-05-14 DIAGNOSIS — F17210 Nicotine dependence, cigarettes, uncomplicated: Secondary | ICD-10-CM | POA: Diagnosis not present

## 2018-05-14 DIAGNOSIS — M79672 Pain in left foot: Secondary | ICD-10-CM | POA: Diagnosis not present

## 2018-06-11 DIAGNOSIS — Z3201 Encounter for pregnancy test, result positive: Secondary | ICD-10-CM | POA: Diagnosis not present

## 2018-06-13 DIAGNOSIS — Z7689 Persons encountering health services in other specified circumstances: Secondary | ICD-10-CM | POA: Diagnosis not present

## 2018-06-13 DIAGNOSIS — Z331 Pregnant state, incidental: Secondary | ICD-10-CM | POA: Diagnosis not present

## 2018-06-21 DIAGNOSIS — Z23 Encounter for immunization: Secondary | ICD-10-CM | POA: Diagnosis not present

## 2018-06-21 DIAGNOSIS — N912 Amenorrhea, unspecified: Secondary | ICD-10-CM | POA: Diagnosis not present

## 2018-06-21 DIAGNOSIS — Z3201 Encounter for pregnancy test, result positive: Secondary | ICD-10-CM | POA: Diagnosis not present

## 2018-07-04 DIAGNOSIS — Z3A01 Less than 8 weeks gestation of pregnancy: Secondary | ICD-10-CM | POA: Diagnosis not present

## 2018-07-04 DIAGNOSIS — O26891 Other specified pregnancy related conditions, first trimester: Secondary | ICD-10-CM | POA: Diagnosis not present

## 2018-07-09 LAB — HM PAP SMEAR: HM Pap smear: NEGATIVE

## 2018-08-28 ENCOUNTER — Telehealth: Payer: Self-pay | Admitting: Medical

## 2018-08-28 NOTE — Telephone Encounter (Signed)
Ok, in that case, she should continue prenatal care with OB/Gyn.  Hope her pregnancy goes well!  Audelia Acton

## 2018-08-28 NOTE — Telephone Encounter (Signed)
Called pt about scheduling follow up visit based on her 03/2018 visit  She states that she is currently pregnant and under care of OBGYN, Dr. Terri Piedra at [facility]  Due 02/18/2019 She last saw Dr. Terri Piedra 3 weeks ago Do you still need to see her for follow up? Did advise her to have Dr. Ivor Costa office to send over any labs that had especially related to blood sugar

## 2018-09-09 ENCOUNTER — Encounter: Payer: Self-pay | Admitting: Medical

## 2018-09-20 ENCOUNTER — Other Ambulatory Visit (HOSPITAL_COMMUNITY)
Admission: RE | Admit: 2018-09-20 | Discharge: 2018-09-20 | Disposition: A | Discharge status: Home / Self Care | Payer: Commercial Managed Care - PPO | Source: Ambulatory Visit | Attending: Obstetrics and Gynecology | Admitting: Obstetrics and Gynecology

## 2018-09-20 ENCOUNTER — Encounter (HOSPITAL_COMMUNITY): Payer: Self-pay | Admitting: *Deleted

## 2018-09-20 DIAGNOSIS — Z01812 Encounter for preprocedural laboratory examination: Secondary | ICD-10-CM | POA: Insufficient documentation

## 2018-09-20 DIAGNOSIS — Z1159 Encounter for screening for other viral diseases: Secondary | ICD-10-CM | POA: Diagnosis not present

## 2018-09-20 NOTE — Pre-Procedure Instructions (Signed)
Pt states her covid test was done at [facility]

## 2018-09-21 ENCOUNTER — Encounter (HOSPITAL_COMMUNITY): Payer: Self-pay | Admitting: Obstetrics and Gynecology

## 2018-09-21 DIAGNOSIS — O26872 Cervical shortening, second trimester: Secondary | ICD-10-CM

## 2018-09-21 HISTORY — DX: Cervical shortening, second trimester: O26.872

## 2018-09-21 LAB — NOVEL CORONAVIRUS, NAA (HOSP ORDER, SEND-OUT TO REF LAB; TAT 18-24 HRS): SARS-CoV-2, NAA: NOT DETECTED

## 2018-09-21 NOTE — H&P (View-Only) (Signed)
Lynn Tucker is a 28 y.o. female G3P0020 at 18+% with short cervix for cervical cerclage (McDonald).  D/W pt r/b/a and [place] indication for cerclage.  Had Covid testing Friday was negative.  Several weeks ago had spotting.  On SSE cervix noted to be closed.  [place] 02/18/19 by 1st tri [place].  Pt with h/o molar pregnancy, Sickle cell trait and impaired glucose tolerance (elevated Hgb A1C).  Anatomy scan normal except shortened cervix.    OB History    Gravida  3   Para  0   Term  0   Preterm  0   AB  2   Living  0     SAB  2   TAB  0   Ectopic  0   Multiple  0   Live Births           Obstetric Comments  D&E for incomplete abortion      D&E for molar pregnancy G1 SAB G2 molar G3 present - short cervix  No abn pap, last 3/20 H/o Chl, last check neg  Past Medical History:  Diagnosis Date  . ADHD (attention deficit hyperactivity disorder)   . Chlamydia 11/11/2015  . H. pylori infection 1/16   off antibiotics at this time  . Hyperlipidemia   . Impaired fasting glucose    2013  . Molar pregnancy 04/25/2017  . Obesity   . Short cervix during pregnancy in second trimester 09/21/2018  . Sickle cell trait ([place])   . VSD (ventricular septal defect)    followed by cardiology every 2 years  . Wears glasses    Past Surgical History:  Procedure Laterality Date  . DILATION AND EVACUATION N/A 08/05/2015   Procedure: DILATATION AND EVACUATION;  Surgeon: Marylynn Pearson, MD;  Location: [facility];  Service: Gynecology;  Laterality: N/A;  . DILATION AND EVACUATION N/A 04/25/2017   Procedure: DILATATION AND EVACUATION;  Surgeon: Janyth Contes, MD;  Location: [facility];  Service: Gynecology;  Laterality: N/A;  . WISDOM TOOTH EXTRACTION     Family History: family history includes Cancer in an other family member; Diabetes in an other family member; Hyperlipidemia in an other family member; Hypertension in her father and mother; Sickle cell trait in her father; Stroke in an other  family member; Thyroid disease in her paternal aunt. Social History:  reports that she has never smoked. She has never used smokeless tobacco. She reports that she does not drink alcohol or use drugs. (States quit MJ with pregnancy) single studying pharm tech  Meds PNV All NKDA     Maternal Diabetes: No Genetic Screening: Normal Maternal Ultrasounds/Referrals: Normal Fetal Ultrasounds or other Referrals:  None Maternal Substance Abuse:  Yes:  Type: Other: h/o MJ Significant Maternal Medications:  None Significant Maternal Lab Results:  None Other Comments:  None  Review of Systems  Constitutional: Negative.   HENT: Negative.   Eyes: Negative.   Respiratory: Negative.   Cardiovascular: Negative.   Gastrointestinal: Negative.   Genitourinary: Negative.   Musculoskeletal: Negative.   Skin: Negative.   Neurological: Negative.   Psychiatric/Behavioral: Negative.    Maternal Medical History:  Fetal activity: Perceived fetal activity is normal.    Prenatal complications: Short cervix at anatomy scan Had spotting  Prenatal Complications - Diabetes: none. Pre DM - insulin resistant    Height 5' (1.524 m), weight 81.2 kg, unknown if currently breastfeeding. Maternal Exam:  Abdomen: Patient reports no abdominal tenderness. Fundal height is appropriate for gestation.  Introitus: Normal vulva. Normal vagina.    Physical Exam  Constitutional: She is oriented to person, place, and time. She appears well-developed and well-nourished.  HENT:  Head: Normocephalic and atraumatic.  Cardiovascular: Normal rate and regular rhythm.  Respiratory: Effort normal and breath sounds normal. No respiratory distress. She has no wheezes.  GI: Soft. Bowel sounds are normal. She exhibits no distension. There is no abdominal tenderness.  Genitourinary:    Vulva normal.   Musculoskeletal: Normal range of motion.  Neurological: She is alert and oriented to person, place, and time.  Skin: Skin  is warm and dry.  Psychiatric: She has a normal mood and affect. Her behavior is normal.    Prenatal labs: ABO, Rh:  A+ Antibody:  neg Rubella:  immune RPR:   NR HBsAg:   NR HIV:   NR GBS:   unknown  Hgb 11.8/Plt 410/Ur Cx neg/GC neg/Chl neg/Varicella immune/Hgb A1C = 5.7  Anat WNL, except Shortened cervix  Assessment/Plan: 38GT [id number] at 18+ with short cervix for McDonald cervical cerclage D/w pt r/b/a and process - will proceed Sunday 6/14  Carrel Leather Bovard-Stuckert 09/21/2018, 3:17 PM

## 2018-09-21 NOTE — H&P (Signed)
Lynn Tucker is a 28 y.o. female G3P0020 at 18+% with short cervix for cervical cerclage (McDonald).  D/W pt r/b/a and [place] indication for cerclage.  Had Covid testing Friday was negative.  Several weeks ago had spotting.  On SSE cervix noted to be closed.  [place] 02/18/19 by 1st tri [place].  Pt with h/o molar pregnancy, Sickle cell trait and impaired glucose tolerance (elevated Hgb A1C).  Anatomy scan normal except shortened cervix.    OB History    Gravida  3   Para  0   Term  0   Preterm  0   AB  2   Living  0     SAB  2   TAB  0   Ectopic  0   Multiple  0   Live Births           Obstetric Comments  D&E for incomplete abortion      D&E for molar pregnancy G1 SAB G2 molar G3 present - short cervix  No abn pap, last 3/20 H/o Chl, last check neg  Past Medical History:  Diagnosis Date  . ADHD (attention deficit hyperactivity disorder)   . Chlamydia 11/11/2015  . H. pylori infection 1/16   off antibiotics at this time  . Hyperlipidemia   . Impaired fasting glucose    2013  . Molar pregnancy 04/25/2017  . Obesity   . Short cervix during pregnancy in second trimester 09/21/2018  . Sickle cell trait ([place])   . VSD (ventricular septal defect)    followed by cardiology every 2 years  . Wears glasses    Past Surgical History:  Procedure Laterality Date  . DILATION AND EVACUATION N/A 08/05/2015   Procedure: DILATATION AND EVACUATION;  Surgeon: Marylynn Pearson, MD;  Location: [facility];  Service: Gynecology;  Laterality: N/A;  . DILATION AND EVACUATION N/A 04/25/2017   Procedure: DILATATION AND EVACUATION;  Surgeon: Janyth Contes, MD;  Location: [facility];  Service: Gynecology;  Laterality: N/A;  . WISDOM TOOTH EXTRACTION     Family History: family history includes Cancer in an other family member; Diabetes in an other family member; Hyperlipidemia in an other family member; Hypertension in her father and mother; Sickle cell trait in her father; Stroke in an other  family member; Thyroid disease in her paternal aunt. Social History:  reports that she has never smoked. She has never used smokeless tobacco. She reports that she does not drink alcohol or use drugs. (States quit MJ with pregnancy) single studying pharm tech  Meds PNV All NKDA     Maternal Diabetes: No Genetic Screening: Normal Maternal Ultrasounds/Referrals: Normal Fetal Ultrasounds or other Referrals:  None Maternal Substance Abuse:  Yes:  Type: Other: h/o MJ Significant Maternal Medications:  None Significant Maternal Lab Results:  None Other Comments:  None  Review of Systems  Constitutional: Negative.   HENT: Negative.   Eyes: Negative.   Respiratory: Negative.   Cardiovascular: Negative.   Gastrointestinal: Negative.   Genitourinary: Negative.   Musculoskeletal: Negative.   Skin: Negative.   Neurological: Negative.   Psychiatric/Behavioral: Negative.    Maternal Medical History:  Fetal activity: Perceived fetal activity is normal.    Prenatal complications: Short cervix at anatomy scan Had spotting  Prenatal Complications - Diabetes: none. Pre DM - insulin resistant    Height 5' (1.524 m), weight 81.2 kg, unknown if currently breastfeeding. Maternal Exam:  Abdomen: Patient reports no abdominal tenderness. Fundal height is appropriate for gestation.  Introitus: Normal vulva. Normal vagina.    Physical Exam  Constitutional: She is oriented to person, place, and time. She appears well-developed and well-nourished.  HENT:  Head: Normocephalic and atraumatic.  Cardiovascular: Normal rate and regular rhythm.  Respiratory: Effort normal and breath sounds normal. No respiratory distress. She has no wheezes.  GI: Soft. Bowel sounds are normal. She exhibits no distension. There is no abdominal tenderness.  Genitourinary:    Vulva normal.   Musculoskeletal: Normal range of motion.  Neurological: She is alert and oriented to person, place, and time.  Skin: Skin  is warm and dry.  Psychiatric: She has a normal mood and affect. Her behavior is normal.    Prenatal labs: ABO, Rh:  A+ Antibody:  neg Rubella:  immune RPR:   NR HBsAg:   NR HIV:   NR GBS:   unknown  Hgb 11.8/Plt 410/Ur Cx neg/GC neg/Chl neg/Varicella immune/Hgb A1C = 5.7  Anat WNL, except Shortened cervix  Assessment/Plan: 26ST [id number] at 18+ with short cervix for McDonald cervical cerclage D/w pt r/b/a and process - will proceed Sunday 6/14  Anysia Choi Bovard-Stuckert 09/21/2018, 3:17 PM

## 2018-09-22 ENCOUNTER — Inpatient Hospital Stay (HOSPITAL_COMMUNITY): Payer: Commercial Managed Care - PPO | Admitting: Anesthesiology

## 2018-09-22 ENCOUNTER — Encounter (HOSPITAL_COMMUNITY): Payer: Self-pay | Admitting: Anesthesiology

## 2018-09-22 ENCOUNTER — Inpatient Hospital Stay (HOSPITAL_COMMUNITY)
Admission: AD | Admit: 2018-09-22 | Discharge: 2018-09-22 | Disposition: A | Discharge status: Still In Hospital | Payer: Commercial Managed Care - PPO | Source: Ambulatory Visit | Attending: Obstetrics and Gynecology | Admitting: Obstetrics and Gynecology

## 2018-09-22 ENCOUNTER — Encounter (HOSPITAL_COMMUNITY): Payer: Self-pay | Admitting: *Deleted

## 2018-09-22 ENCOUNTER — Other Ambulatory Visit: Payer: Self-pay

## 2018-09-22 ENCOUNTER — Ambulatory Visit (HOSPITAL_COMMUNITY)
Admission: RE | Admit: 2018-09-22 | Discharge: 2018-09-22 | Disposition: A | Discharge status: Home / Self Care | Payer: Commercial Managed Care - PPO | Attending: Obstetrics and Gynecology | Admitting: Obstetrics and Gynecology

## 2018-09-22 ENCOUNTER — Inpatient Hospital Stay (HOSPITAL_COMMUNITY)
Admission: RE | Admit: 2018-09-22 | Payer: Commercial Managed Care - PPO | Source: Home / Self Care | Admitting: Obstetrics and Gynecology

## 2018-09-22 ENCOUNTER — Encounter (HOSPITAL_COMMUNITY)
Admission: AD | Disposition: A | Discharge status: Still In Hospital | Payer: Self-pay | Source: Ambulatory Visit | Attending: Obstetrics and Gynecology

## 2018-09-22 DIAGNOSIS — O3430 Maternal care for cervical incompetence, unspecified trimester: Secondary | ICD-10-CM | POA: Diagnosis not present

## 2018-09-22 DIAGNOSIS — Q210 Ventricular septal defect: Secondary | ICD-10-CM | POA: Diagnosis not present

## 2018-09-22 DIAGNOSIS — Z8349 Family history of other endocrine, nutritional and metabolic diseases: Secondary | ICD-10-CM | POA: Insufficient documentation

## 2018-09-22 DIAGNOSIS — Z832 Family history of diseases of the blood and blood-forming organs and certain disorders involving the immune mechanism: Secondary | ICD-10-CM | POA: Insufficient documentation

## 2018-09-22 DIAGNOSIS — Z833 Family history of diabetes mellitus: Secondary | ICD-10-CM | POA: Insufficient documentation

## 2018-09-22 DIAGNOSIS — F909 Attention-deficit hyperactivity disorder, unspecified type: Secondary | ICD-10-CM | POA: Diagnosis not present

## 2018-09-22 DIAGNOSIS — E785 Hyperlipidemia, unspecified: Secondary | ICD-10-CM | POA: Insufficient documentation

## 2018-09-22 DIAGNOSIS — D573 Sickle-cell trait: Secondary | ICD-10-CM | POA: Insufficient documentation

## 2018-09-22 DIAGNOSIS — Z3A00 Weeks of gestation of pregnancy not specified: Secondary | ICD-10-CM | POA: Insufficient documentation

## 2018-09-22 DIAGNOSIS — Z1159 Encounter for screening for other viral diseases: Secondary | ICD-10-CM | POA: Diagnosis not present

## 2018-09-22 DIAGNOSIS — N883 Incompetence of cervix uteri: Secondary | ICD-10-CM | POA: Diagnosis present

## 2018-09-22 DIAGNOSIS — Z809 Family history of malignant neoplasm, unspecified: Secondary | ICD-10-CM | POA: Diagnosis not present

## 2018-09-22 DIAGNOSIS — O26872 Cervical shortening, second trimester: Secondary | ICD-10-CM

## 2018-09-22 DIAGNOSIS — Z8249 Family history of ischemic heart disease and other diseases of the circulatory system: Secondary | ICD-10-CM | POA: Diagnosis not present

## 2018-09-22 DIAGNOSIS — Z823 Family history of stroke: Secondary | ICD-10-CM | POA: Diagnosis not present

## 2018-09-22 HISTORY — PX: CERVICAL CERCLAGE: SHX1329

## 2018-09-22 LAB — CBC
HCT: 34.6 % — ABNORMAL LOW (ref 36.0–46.0)
Hemoglobin: 11.8 g/dL — ABNORMAL LOW (ref 12.0–15.0)
MCH: 27.4 pg (ref 26.0–34.0)
MCHC: 34.1 g/dL (ref 30.0–36.0)
MCV: 80.5 fL (ref 80.0–100.0)
Platelets: 301 10*3/uL (ref 150–400)
RBC: 4.3 MIL/uL (ref 3.87–5.11)
RDW: 14.5 % (ref 11.5–15.5)
WBC: 7.6 10*3/uL (ref 4.0–10.5)
nRBC: 0 % (ref 0.0–0.2)

## 2018-09-22 SURGERY — CERCLAGE, CERVIX, VAGINAL APPROACH
Anesthesia: Spinal | Site: Cervix | Wound class: Clean Contaminated

## 2018-09-22 MED ORDER — LACTATED RINGERS IV SOLN
INTRAVENOUS | Status: DC | PRN
  Administered 2018-09-22 (×2): via INTRAVENOUS

## 2018-09-22 MED ORDER — BUPIVACAINE IN DEXTROSE 0.75-8.25 % IT SOLN
INTRATHECAL | Status: DC | PRN
  Administered 2018-09-22: .8 mL via INTRATHECAL

## 2018-09-22 MED ORDER — LACTATED RINGERS IV SOLN
INTRAVENOUS | Status: DC
  Administered 2018-09-22: 13:00:00 via INTRAVENOUS

## 2018-09-22 MED ORDER — FENTANYL CITRATE (PF) 100 MCG/2ML IJ SOLN
INTRAMUSCULAR | Status: DC | PRN
  Administered 2018-09-22: 25 ug via INTRATHECAL
  Administered 2018-09-22: 25 ug via INTRAVENOUS

## 2018-09-22 MED ORDER — CEFAZOLIN SODIUM-DEXTROSE 2-4 GM/100ML-% IV SOLN
2.0000 g | INTRAVENOUS | Status: AC
  Administered 2018-09-22: 2 g via INTRAVENOUS

## 2018-09-22 MED ORDER — ONDANSETRON HCL 4 MG/2ML IJ SOLN
INTRAMUSCULAR | Status: DC | PRN
  Administered 2018-09-22: 4 mg via INTRAVENOUS

## 2018-09-22 MED ORDER — IBUPROFEN 800 MG PO TABS
ORAL_TABLET | ORAL | Status: AC
  Filled 2018-09-22: qty 1

## 2018-09-22 MED ORDER — CEFAZOLIN SODIUM-DEXTROSE 2-4 GM/100ML-% IV SOLN
INTRAVENOUS | Status: AC
  Filled 2018-09-22: qty 100

## 2018-09-22 MED ORDER — PHENYLEPHRINE HCL (PRESSORS) 10 MG/ML IV SOLN
INTRAVENOUS | Status: DC | PRN
  Administered 2018-09-22 (×3): 80 ug via INTRAVENOUS
  Administered 2018-09-22: 120 ug via INTRAVENOUS
  Administered 2018-09-22: 40 ug via INTRAVENOUS

## 2018-09-22 MED ORDER — IBUPROFEN 800 MG PO TABS: 800.0000 mg | ORAL_TABLET | Freq: Three times a day (TID) | ORAL | 0 refills | Status: AC

## 2018-09-22 MED ORDER — IBUPROFEN 800 MG PO TABS
800.0000 mg | ORAL_TABLET | Freq: Once | ORAL | Status: AC
  Administered 2018-09-22: 800 mg via ORAL

## 2018-09-22 MED ORDER — STERILE WATER FOR IRRIGATION IR SOLN
Status: DC | PRN
  Administered 2018-09-22: 1

## 2018-09-22 SURGICAL SUPPLY — 17 items
CANISTER SUCT 3000ML PPV (MISCELLANEOUS) ×4 IMPLANT
GLOVE BIO SURGEON STRL SZ 6.5 (GLOVE) ×2 IMPLANT
GLOVE BIOGEL PI IND STRL 7.0 (GLOVE) ×1 IMPLANT
GLOVE BIOGEL PI INDICATOR 7.0 (GLOVE) ×1
GOWN STRL REUS W/TWL LRG LVL3 (GOWN DISPOSABLE) ×4 IMPLANT
NDL MAYO CATGUT SZ4 TPR NDL (NEEDLE) ×1 IMPLANT
NEEDLE MAYO CATGUT SZ4 (NEEDLE) ×2 IMPLANT
NS IRRIG 1000ML POUR BTL (IV SOLUTION) ×2 IMPLANT
PACK VAGINAL MINOR WOMEN LF (CUSTOM PROCEDURE TRAY) ×2 IMPLANT
PAD OB MATERNITY 4.3X12.25 (PERSONAL CARE ITEMS) ×2 IMPLANT
PAD PREP 24X48 CUFFED NSTRL (MISCELLANEOUS) ×2 IMPLANT
SUT PROLENE 1 CTX 30  8455H (SUTURE) ×2
SUT PROLENE 1 CTX 30 8455H (SUTURE) ×2 IMPLANT
TOWEL OR 17X24 6PK STRL BLUE (TOWEL DISPOSABLE) ×4 IMPLANT
TRAY FOLEY W/BAG SLVR 14FR (SET/KITS/TRAYS/PACK) ×2 IMPLANT
TUBING NON-CON 1/4 X 20 CONN (TUBING) ×2 IMPLANT
YANKAUER SUCT BULB TIP NO VENT (SUCTIONS) ×2 IMPLANT

## 2018-09-22 NOTE — Brief Op Note (Signed)
CERVICAL CERCLAGE  2:41 PM  PATIENT:  Kimori Allene Dillon  28 y.o. female  PRE-OPERATIVE DIAGNOSIS: shortened cervix on [place], for McDonald cervical cerclage  POST-OPERATIVE DIAGNOSIS: shortened cervix on [place], s/p McDonald cervical cerclage  PROCEDURE:  McDonald cervical cerclage  SURGEON: Bovard, J MD  ANESTHESIA:   spinal  EBL:  10cc, IVF and uop per anesthesia  BLOOD ADMINISTERED:none  DRAINS: Urinary Catheter (Foley) to PACU  LOCAL MEDICATIONS USED:  none  SPECIMEN:  No Specimen  DISPOSITION OF SPECIMEN:  N/A  COUNTS:  YES  TOURNIQUET:  * No surgery found *  DICTATION: .Other Dictation: Dictation Number [id number]  PLAN OF CARE: Discharge to home after PACU  PATIENT DISPOSITION:  PACU - hemodynamically stable.   Delay start of Pharmacological VTE agent (>24hrs) due to surgical blood loss or risk of bleeding: not applicable

## 2018-09-22 NOTE — Progress Notes (Signed)
FHR 136-140 per doppler at 14:45 in PACU following cerclage procedure.

## 2018-09-22 NOTE — Anesthesia Preprocedure Evaluation (Signed)
Anesthesia Evaluation  Patient identified by MRN, date of birth, ID band Patient awake    Reviewed: Allergy & Precautions, H&P , NPO status , Patient's Chart, lab work & pertinent test results  Airway Mallampati: II  TM Distance: >3 FB Neck ROM: full    Dental no notable dental hx. (+) Teeth Intact   Pulmonary neg pulmonary ROS,    Pulmonary exam normal breath sounds clear to auscultation       Cardiovascular + Valvular Problems/Murmurs  Rhythm:regular Rate:Normal + Systolic murmurs    Neuro/Psych negative neurological ROS  negative psych ROS   GI/Hepatic negative GI ROS, Neg liver ROS,   Endo/Other  negative endocrine ROS  Renal/GU negative Renal ROS     Musculoskeletal   Abdominal (+) + obese,   Peds  Hematology negative hematology ROS (+)   Anesthesia Other Findings   Reproductive/Obstetrics (+) Pregnancy                             Anesthesia Physical Anesthesia Plan  ASA: II  Anesthesia Plan: Spinal   Post-op Pain Management:    Induction:   PONV Risk Score and Plan:   Airway Management Planned: Nasal Cannula and Natural Airway  Additional Equipment:   Intra-op Plan:   Post-operative Plan:   Informed Consent: I have reviewed the patients History and Physical, chart, labs and discussed the procedure including the risks, benefits and alternatives for the proposed anesthesia with the patient or authorized representative who has indicated his/her understanding and acceptance.       Plan Discussed with: CRNA  Anesthesia Plan Comments:         Anesthesia Quick Evaluation

## 2018-09-22 NOTE — Interval H&P Note (Signed)
History and Physical Interval Note:  09/22/2018 1:23 PM  Lynn Tucker  has presented today for surgery, with the diagnosis of shortened cervix.  The various methods of treatment have been discussed with the patient and family. After consideration of risks, benefits and other options for treatment, the patient has consented to  McDonald cervical cerclage as a surgical intervention.  The patient's history has been reviewed, patient examined, no change in status, stable for surgery.  I have reviewed the patient's chart and labs.  Questions were answered to the patient's satisfaction.     Shamila Lerch Bovard-Stuckert

## 2018-09-22 NOTE — Transfer of Care (Signed)
Immediate Anesthesia Transfer of Care Note  Patient: Lynn Tucker  Procedure(s) Performed: CERCLAGE CERVICAL (N/A Cervix)  Patient Location: PACU  Anesthesia Type:Spinal  Level of Consciousness: awake, alert  and oriented  Airway & Oxygen Therapy: Patient Spontanous Breathing  Post-op Assessment: Report given to RN and Post -op Vital signs reviewed and stable  Post vital signs: Reviewed and stable  Last Vitals:  Vitals Value Taken Time  BP    Temp    Pulse    Resp    SpO2      Last Pain: There were no vitals filed for this visit.       Complications: No apparent anesthesia complications

## 2018-09-22 NOTE — Progress Notes (Signed)
Patient able to stand and walk to chair. No bleeding, VSS. AVS given to patient as Dr. Melba Coon had reviewed with her.  Patient wheeled to family member's car and discharged home.

## 2018-09-22 NOTE — Discharge Instructions (Signed)
AVS given on paper as reviewed by Dr. Melba Coon

## 2018-09-22 NOTE — Anesthesia Procedure Notes (Signed)
Spinal  Patient location during procedure: OR Start time: 09/22/2018 1:39 PM End time: 09/22/2018 1:43 PM Staffing Anesthesiologist: Lyn Hollingshead, MD Performed: anesthesiologist  Preanesthetic Checklist Completed: patient identified, site marked, surgical consent, pre-op evaluation, timeout performed, IV checked, risks and benefits discussed and monitors and equipment checked Spinal Block Patient position: sitting Prep: site prepped and draped and DuraPrep Patient monitoring: continuous pulse ox and blood pressure Approach: midline Location: L3-4 Injection technique: single-shot Needle Needle type: Pencan  Needle gauge: 24 G Needle length: 10 cm Needle insertion depth: 7 cm Assessment Sensory level: T4

## 2018-09-22 NOTE — Anesthesia Postprocedure Evaluation (Signed)
Anesthesia Post Note  Patient: Lynn Tucker  Procedure(s) Performed: CERCLAGE CERVICAL (N/A Cervix)     Patient location during evaluation: PACU Anesthesia Type: Spinal Level of consciousness: awake Pain management: pain level controlled Vital Signs Assessment: post-procedure vital signs reviewed and stable Respiratory status: spontaneous breathing Cardiovascular status: stable Postop Assessment: no headache, no backache, spinal receding, patient able to bend at knees and no apparent nausea or vomiting Anesthetic complications: no    Last Vitals:  Vitals:   09/22/18 1445 09/22/18 1500  BP: (!) 104/54 100/70  Pulse: 63 79  Resp: 13 (!) 28  Temp:    SpO2: 100% 100%    Last Pain:  Vitals:   09/22/18 1430  TempSrc: Oral   Pain Goal:                   Huston Foley

## 2018-09-23 ENCOUNTER — Encounter (HOSPITAL_COMMUNITY): Payer: Self-pay | Admitting: Obstetrics and Gynecology

## 2018-09-23 NOTE — Op Note (Signed)
NAME: YARITHZA, MINK MEDICAL RECORD [id number] ACCOUNT [id number] DATE OF BIRTH:10-26-1990 FACILITY: [facility] LOCATION: [facility] PHYSICIAN:Tafari Humiston BOVARD-STUCKERT, MD  OPERATIVE REPORT  DATE OF PROCEDURE:  09/22/2018  PREOPERATIVE DIAGNOSIS:  Short cervix on ultrasound.  POSTOPERATIVE DIAGNOSIS:  Short cervix on ultrasound, status post McDonald cervical cerclage.  PROCEDURE:  McDonald cervical cerclage.  SURGEON:  Janyth Contes, MD  ASSISTANT:  None.  PATHOLOGY:  None.  COMPLICATIONS:  None.  FINDINGS:  Shortened cervix approximately 1-2 cm.  Not dilated with a McDonald cerclage placed with the knot at 12 o'clock.  ESTIMATED BLOOD LOSS:  Approximately 10 mL.  IV FLUIDS AND URINE OUTPUT:  Per anesthesia.  DESCRIPTION OF PROCEDURE:  After informed consent was reviewed with the patient and her partner, she was transported to the operating room where spinal anesthesia was placed and found to be adequate.  She was then returned to the supine position with a  leftward tilt.  Her legs were placed in the stirrups.  After an appropriate timeout, she was prepped and draped in the normal sterile fashion1.  Her vagina was prepped alone with sterile water.  Using a heavy-weighted speculum and a retractor, her cervix  was easily visualized, grasped with ring forceps.  The ring had been interrupted, and the cerclage was placed with 0 Prolene on a Mayo needle in a pursestring fashion.  The cervix was inspected and examined after found to be closed and the cerclage was  palpated.  The patient tolerated the procedure well.  Sponge, lap and needle counts were correct x2 per the operating room staff.  LN/NUANCE  D:09/22/2018 T:09/22/2018 JOB:[id number]

## 2018-10-07 ENCOUNTER — Encounter (HOSPITAL_COMMUNITY): Payer: Self-pay

## 2018-10-30 ENCOUNTER — Other Ambulatory Visit: Payer: Self-pay

## 2018-10-30 ENCOUNTER — Encounter: Payer: Commercial Managed Care - PPO | Attending: Obstetrics and Gynecology | Admitting: Registered"

## 2018-10-30 DIAGNOSIS — O99810 Abnormal glucose complicating pregnancy: Secondary | ICD-10-CM | POA: Diagnosis not present

## 2018-11-02 ENCOUNTER — Encounter: Payer: Self-pay | Admitting: Registered"

## 2018-11-02 NOTE — Progress Notes (Signed)
Patient was seen on 10/30/2018 for Gestational Diabetes self-management class at the [facility]. The following learning objectives were met by the patient during this course:   States the definition of Gestational Diabetes  States why dietary management is important in controlling blood glucose  Describes the effects each nutrient has on blood glucose levels  Demonstrates ability to create a balanced meal plan  Demonstrates carbohydrate counting   States when to check blood glucose levels  Demonstrates proper blood glucose monitoring techniques  States the effect of stress and exercise on blood glucose levels  States the importance of limiting caffeine and abstaining from alcohol and smoking  Blood glucose monitor given: [employer] Flex Lot # [id number] Exp: [id number] X Blood glucose reading: 118 mg/dL  Patient instructed to monitor glucose levels: FBS: 60 - <95; 1 hour: <140; 2 hour: <120  Patient received handouts:  Nutrition Diabetes and Pregnancy, including carb counting list  Patient will be seen for follow-up as needed.

## 2019-01-30 ENCOUNTER — Inpatient Hospital Stay (HOSPITAL_COMMUNITY): Payer: Commercial Managed Care - PPO | Admitting: Anesthesiology

## 2019-01-30 ENCOUNTER — Other Ambulatory Visit: Payer: Self-pay

## 2019-01-30 ENCOUNTER — Encounter (HOSPITAL_COMMUNITY): Payer: Self-pay

## 2019-01-30 ENCOUNTER — Inpatient Hospital Stay (HOSPITAL_COMMUNITY)
Admission: AD | Admit: 2019-01-30 | Discharge: 2019-02-01 | DRG: 807 | Disposition: A | Discharge status: Home / Self Care | Payer: Commercial Managed Care - PPO | Attending: Obstetrics and Gynecology | Admitting: Obstetrics and Gynecology

## 2019-01-30 DIAGNOSIS — O9902 Anemia complicating childbirth: Secondary | ICD-10-CM | POA: Diagnosis present

## 2019-01-30 DIAGNOSIS — D573 Sickle-cell trait: Secondary | ICD-10-CM | POA: Diagnosis present

## 2019-01-30 DIAGNOSIS — Z3A37 37 weeks gestation of pregnancy: Secondary | ICD-10-CM

## 2019-01-30 DIAGNOSIS — O26872 Cervical shortening, second trimester: Secondary | ICD-10-CM

## 2019-01-30 DIAGNOSIS — O26893 Other specified pregnancy related conditions, third trimester: Secondary | ICD-10-CM | POA: Diagnosis present

## 2019-01-30 DIAGNOSIS — O24410 Gestational diabetes mellitus in pregnancy, diet controlled: Secondary | ICD-10-CM | POA: Diagnosis present

## 2019-01-30 DIAGNOSIS — Z20828 Contact with and (suspected) exposure to other viral communicable diseases: Secondary | ICD-10-CM | POA: Diagnosis present

## 2019-01-30 LAB — GLUCOSE, CAPILLARY
Glucose-Capillary: 67 mg/dL — ABNORMAL LOW (ref 70–99)
Glucose-Capillary: 131 mg/dL — ABNORMAL HIGH (ref 70–99)

## 2019-01-30 LAB — CBC
HCT: 39.8 % (ref 36.0–46.0)
Hemoglobin: 13.2 g/dL (ref 12.0–15.0)
MCH: 27.5 pg (ref 26.0–34.0)
MCHC: 33.2 g/dL (ref 30.0–36.0)
MCV: 82.9 fL (ref 80.0–100.0)
Platelets: 261 10*3/uL (ref 150–400)
RBC: 4.8 MIL/uL (ref 3.87–5.11)
RDW: 13.6 % (ref 11.5–15.5)
WBC: 9.6 10*3/uL (ref 4.0–10.5)
nRBC: 0 % (ref 0.0–0.2)

## 2019-01-30 LAB — OB RESULTS CONSOLE GC/CHLAMYDIA
Chlamydia: NEGATIVE
Gonorrhea: NEGATIVE

## 2019-01-30 LAB — TYPE AND SCREEN
ABO/RH(D): A POS
Antibody Screen: NEGATIVE

## 2019-01-30 LAB — OB RESULTS CONSOLE HEPATITIS B SURFACE ANTIGEN: Hepatitis B Surface Ag: NEGATIVE

## 2019-01-30 LAB — OB RESULTS CONSOLE ABO/RH: RH Type: POSITIVE

## 2019-01-30 LAB — OB RESULTS CONSOLE HIV ANTIBODY (ROUTINE TESTING): HIV: NONREACTIVE

## 2019-01-30 LAB — ABO/RH: ABO/RH(D): A POS

## 2019-01-30 LAB — OB RESULTS CONSOLE RPR: RPR: NONREACTIVE

## 2019-01-30 LAB — OB RESULTS CONSOLE RUBELLA ANTIBODY, IGM: Rubella: IMMUNE

## 2019-01-30 LAB — SARS CORONAVIRUS 2 BY RT PCR (HOSPITAL ORDER, PERFORMED IN ~~LOC~~ HOSPITAL LAB): SARS Coronavirus 2: NEGATIVE

## 2019-01-30 LAB — OB RESULTS CONSOLE ANTIBODY SCREEN: Antibody Screen: NEGATIVE

## 2019-01-30 LAB — OB RESULTS CONSOLE GBS: GBS: NEGATIVE

## 2019-01-30 MED ORDER — OXYCODONE-ACETAMINOPHEN 5-325 MG PO TABS: 2.0000 | ORAL_TABLET | ORAL | Status: DC | PRN

## 2019-01-30 MED ORDER — OXYCODONE-ACETAMINOPHEN 5-325 MG PO TABS: 1.0000 | ORAL_TABLET | ORAL | Status: DC | PRN

## 2019-01-30 MED ORDER — ACETAMINOPHEN 325 MG PO TABS: 650.0000 mg | ORAL_TABLET | ORAL | Status: DC | PRN

## 2019-01-30 MED ORDER — LIDOCAINE-EPINEPHRINE (PF) 2 %-1:200000 IJ SOLN
INTRAMUSCULAR | Status: DC | PRN
  Administered 2019-01-30 (×2): 2 mL via EPIDURAL

## 2019-01-30 MED ORDER — SOD CITRATE-CITRIC ACID 500-334 MG/5ML PO SOLN: 30.0000 mL | ORAL | Status: DC | PRN

## 2019-01-30 MED ORDER — OXYTOCIN BOLUS FROM INFUSION
500.0000 mL | Freq: Once | INTRAVENOUS | Status: AC
  Administered 2019-01-31: 01:00:00 500 mL via INTRAVENOUS

## 2019-01-30 MED ORDER — EPHEDRINE 5 MG/ML INJ
10.0000 mg | INTRAVENOUS | Status: DC | PRN
  Filled 2019-01-30: qty 10

## 2019-01-30 MED ORDER — LACTATED RINGERS IV SOLN: 500.0000 mL | INTRAVENOUS | Status: DC | PRN

## 2019-01-30 MED ORDER — PHENYLEPHRINE 40 MCG/ML (10ML) SYRINGE FOR IV PUSH (FOR BLOOD PRESSURE SUPPORT): 80.0000 ug | PREFILLED_SYRINGE | INTRAVENOUS | Status: DC | PRN

## 2019-01-30 MED ORDER — TERBUTALINE SULFATE 1 MG/ML IJ SOLN: 0.2500 mg | Freq: Once | INTRAMUSCULAR | Status: DC | PRN

## 2019-01-30 MED ORDER — FENTANYL-BUPIVACAINE-NACL 0.5-0.125-0.9 MG/250ML-% EP SOLN
12.0000 mL/h | EPIDURAL | Status: DC | PRN
  Filled 2019-01-30: qty 250

## 2019-01-30 MED ORDER — LIDOCAINE HCL (PF) 1 % IJ SOLN: 30.0000 mL | INTRAMUSCULAR | Status: DC | PRN

## 2019-01-30 MED ORDER — DIPHENHYDRAMINE HCL 50 MG/ML IJ SOLN: 12.5000 mg | INTRAMUSCULAR | Status: DC | PRN

## 2019-01-30 MED ORDER — OXYTOCIN 40 UNITS IN NORMAL SALINE INFUSION - SIMPLE MED
1.0000 m[IU]/min | INTRAVENOUS | Status: DC
  Administered 2019-01-30: 2 m[IU]/min via INTRAVENOUS

## 2019-01-30 MED ORDER — BUTORPHANOL TARTRATE 1 MG/ML IJ SOLN: 1.0000 mg | INTRAMUSCULAR | Status: DC | PRN

## 2019-01-30 MED ORDER — OXYTOCIN 40 UNITS IN NORMAL SALINE INFUSION - SIMPLE MED: 1.0000 m[IU]/min | INTRAVENOUS | Status: DC

## 2019-01-30 MED ORDER — SODIUM CHLORIDE (PF) 0.9 % IJ SOLN
INTRAMUSCULAR | Status: DC | PRN
  Administered 2019-01-30: 12 mL/h via EPIDURAL

## 2019-01-30 MED ORDER — ONDANSETRON HCL 4 MG/2ML IJ SOLN
4.0000 mg | Freq: Four times a day (QID) | INTRAMUSCULAR | Status: DC | PRN
  Administered 2019-01-30: 4 mg via INTRAVENOUS
  Filled 2019-01-30: qty 2

## 2019-01-30 MED ORDER — EPHEDRINE 5 MG/ML INJ: 10.0000 mg | INTRAVENOUS | Status: DC | PRN

## 2019-01-30 MED ORDER — LACTATED RINGERS IV SOLN
500.0000 mL | Freq: Once | INTRAVENOUS | Status: AC
  Administered 2019-01-30: 500 mL via INTRAVENOUS

## 2019-01-30 MED ORDER — LACTATED RINGERS IV SOLN
INTRAVENOUS | Status: DC
  Administered 2019-01-30 (×3): via INTRAVENOUS

## 2019-01-30 MED ORDER — OXYTOCIN 40 UNITS IN NORMAL SALINE INFUSION - SIMPLE MED
2.5000 [IU]/h | INTRAVENOUS | Status: DC
  Filled 2019-01-30: qty 1000

## 2019-01-30 NOTE — Progress Notes (Signed)
Patient ID: Lynn Tucker, female   DOB: 1990-12-30, 28 y.o.   MRN: [id number]  Uncomfortable with ctx  AFVSS gen NAD FHTs 140-150's. Mod var, + accels; category 1 toco q 2-64min  SVE 7.8/90/0-+1  AROM for clear fluid w/o diff/comp  Continue labor

## 2019-01-30 NOTE — H&P (Signed)
Lynn Tucker is a 28 y.o. female G3P0020 at 64 week s/p cerclage removal 10/21.  With increasing contractions and cervical change in MAU.  Pregnancy complicated by impaired glucose tolerance, h/o molar pregnancy (will send placenta to pathology), short cervix - had cerclage placed/removed.  Also sickle cell trait.  Pregnancy dated by early [place].  Tdap given 8/20.     OB History    Gravida  3   Para  0   Term  0   Preterm  0   AB  2   Living  0     SAB  2   TAB  0   Ectopic  0   Multiple  0   Live Births           Obstetric Comments  D&E for incomplete abortion      G1 SAB G2 molar pregnancy - D&C G3 present  No abn pap, h/o Chl   Past Medical History:  Diagnosis Date  . ADHD (attention deficit hyperactivity disorder)   . Chlamydia 11/11/2015  . H. pylori infection 1/16   off antibiotics at this time  . Hyperlipidemia   . Impaired fasting glucose    2013  . Molar pregnancy 04/25/2017  . Obesity   . Short cervix during pregnancy in second trimester 09/21/2018  . Sickle cell trait ([place])   . VSD (ventricular septal defect)    followed by cardiology every 2 years  . Wears glasses    Past Surgical History:  Procedure Laterality Date  . CERVICAL CERCLAGE N/A 09/22/2018   Procedure: CERCLAGE CERVICAL;  Surgeon: Janyth Contes, MD;  Location: [facility];  Service: Gynecology;  Laterality: N/A;  . DILATION AND EVACUATION N/A 08/05/2015   Procedure: DILATATION AND EVACUATION;  Surgeon: Marylynn Pearson, MD;  Location: [facility];  Service: Gynecology;  Laterality: N/A;  . DILATION AND EVACUATION N/A 04/25/2017   Procedure: DILATATION AND EVACUATION;  Surgeon: Janyth Contes, MD;  Location: [facility];  Service: Gynecology;  Laterality: N/A;  . WISDOM TOOTH EXTRACTION     Family History: family history includes Cancer in an other family member; Diabetes in an other family member; Hyperlipidemia in an other family member; Hypertension in her father and mother;  Sickle cell trait in her father; Stroke in an other family member; Thyroid disease in her paternal aunt. Social History:  reports that she has never smoked. She has never used smokeless tobacco. She reports that she does not drink alcohol or use drugs. h/o MJ.  Pharm tech, single  Meds metformin, PNV All NKDA     Maternal Diabetes: Yes:  Diabetes Type:  Pre-pregnancy Genetic Screening: Normal Maternal Ultrasounds/Referrals: Normal Fetal Ultrasounds or other Referrals:  None Maternal Substance Abuse:  No Significant Maternal Medications:  None Significant Maternal Lab Results:  Group B Strep negative Other Comments:  h/o molar preg, short cervix had cerclage  Review of Systems  Constitutional: Negative.   HENT: Negative.   Eyes: Negative.   Respiratory: Negative.   Cardiovascular: Negative.   Gastrointestinal: Positive for abdominal pain.  Genitourinary: Negative.   Musculoskeletal: Negative.   Skin: Negative.   Neurological: Negative.   Psychiatric/Behavioral: Negative.    Maternal Medical History:  Reason for admission: Contractions.   Contractions: Frequency: regular.    Fetal activity: Perceived fetal activity is normal.    Prenatal complications: Impaired glucose tolerance  Prenatal Complications - Diabetes: gestational.    Dilation: 7 Effacement (%): 90 Station: -3 Exam by:: TLYTLE RN Blood pressure 120/62,  pulse 85, temperature 98.8 F (37.1 C), resp. rate 18, height 5' (1.524 m), weight 76.7 kg, unknown if currently breastfeeding. Maternal Exam:  Abdomen: Patient reports no abdominal tenderness. Fundal height is appropriate for gestational age.   Fetal presentation: vertex  Introitus: Normal vulva. Normal vagina.    Physical Exam  Constitutional: She is oriented to person, place, and time. She appears well-developed and well-nourished.  HENT:  Head: Normocephalic and atraumatic.  Cardiovascular: Normal rate and regular rhythm.  Respiratory: Effort  normal and breath sounds normal. No respiratory distress. She has no wheezes.  GI: Soft. Bowel sounds are normal. She exhibits no distension. There is no abdominal tenderness.  Genitourinary:    Vulva normal.   Musculoskeletal: Normal range of motion.  Neurological: She is alert and oriented to person, place, and time.  Skin: Skin is warm and dry.  Psychiatric: She has a normal mood and affect. Her behavior is normal.    Prenatal labs: ABO, Rh:  A+ Antibody:  neg Rubella:  immune RPR:   NR HBsAg:   neg HIV:   NR GBS:   neg Hgb 11.8/ Plt 410/Ur Cx/ Chl neg/ GC neg/ varicella immune/ pap WNL/early [place] dates pregnancy/ tdap 8/20/ glucola 75/  Nl anat, ant plac, (female) Good growth   Assessment/Plan: [id number] at 37+ s/p cerclage removal in labor Epidural or IV pain meds prn Placenta to path - h/o molar pregnancy Augment prn Expect SVD   Latham Kinzler Bovard-Stuckert 01/30/2019, 6:32 PM

## 2019-01-30 NOTE — Anesthesia Preprocedure Evaluation (Signed)
Anesthesia Evaluation  Patient identified by MRN, date of birth, ID band Patient awake    Reviewed: Allergy & Precautions, NPO status , Patient's Chart, lab work & pertinent test results  Airway Mallampati: II  TM Distance: >3 FB Neck ROM: Full    Dental no notable dental hx.    Pulmonary neg pulmonary ROS,    Pulmonary exam normal breath sounds clear to auscultation       Cardiovascular Normal cardiovascular exam+ Valvular Problems/Murmurs (VSD)  Rhythm:Regular Rate:Normal  HLD  TTE 2004 SUMMARY  - Overall left ventricular systolic function was normal. Left ventricular ejection fraction was estimated , range being 55% to 65 %. There were no left ventricular regional wall motion abnormalities. There was a small, perimembranous ventricular septal defect.  - The pulmonary veins were grossly normal.  IMPRESSIONS  - No significant change in very small perimembranous VSD with no aortic valve prolapse or regurgitation.  - Disposition: Recheck in 2 years. No restrictions other than SBE prophylaxis.    Neuro/Psych negative neurological ROS  negative psych ROS   GI/Hepatic negative GI ROS, Neg liver ROS,   Endo/Other  negative endocrine ROS  Renal/GU negative Renal ROS  negative genitourinary   Musculoskeletal negative musculoskeletal ROS (+)   Abdominal   Peds  (+) ADHD Hematology negative hematology ROS (+) Sickle cell trait ,   Anesthesia Other Findings   Reproductive/Obstetrics (+) Pregnancy                            Anesthesia Physical Anesthesia Plan  ASA: III  Anesthesia Plan: Epidural   Post-op Pain Management:    Induction:   PONV Risk Score and Plan: Treatment may vary due to age or medical condition  Airway Management Planned: Natural Airway  Additional Equipment:   Intra-op Plan:   Post-operative Plan:   Informed Consent: I have reviewed the patients  History and Physical, chart, labs and discussed the procedure including the risks, benefits and alternatives for the proposed anesthesia with the patient or authorized representative who has indicated his/her understanding and acceptance.       Plan Discussed with: Anesthesiologist  Anesthesia Plan Comments: (Patient identified. Risks, benefits, options discussed with patient including but not limited to bleeding, infection, nerve damage, paralysis, failed block, incomplete pain control, headache, blood pressure changes, nausea, vomiting, reactions to medication, itching, and post partum back pain. Confirmed with bedside nurse the patient's most recent platelet count. Confirmed with the patient that they are not taking any anticoagulation, have any bleeding history or any family history of bleeding disorders. Patient expressed understanding and wishes to proceed. All questions were answered. )        Anesthesia Quick Evaluation

## 2019-01-30 NOTE — MAU Note (Signed)
Ctx since Tuesday. Got worse this morning and noe they are 4-7 min apart. Denies any vag bleeding or leaking. Reports good fetal movement and mucus plug is coming out. 1cm at last office visit.

## 2019-01-30 NOTE — Anesthesia Procedure Notes (Signed)
Epidural Patient location during procedure: OB Start time: 01/30/2019 8:35 PM End time: 01/30/2019 8:50 PM  Staffing Anesthesiologist: Freddrick March, MD Performed: anesthesiologist   Preanesthetic Checklist Completed: patient identified, pre-op evaluation, timeout performed, IV checked, risks and benefits discussed and monitors and equipment checked  Epidural Patient position: sitting Prep: site prepped and draped and DuraPrep Patient monitoring: continuous pulse ox, blood pressure, heart rate and cardiac monitor Approach: midline Location: L3-L4 Injection technique: LOR air  Needle:  Needle type: Tuohy  Needle gauge: 17 G Needle length: 9 cm Needle insertion depth: 6 cm Catheter type: closed end flexible Catheter size: 19 Gauge Catheter at skin depth: 11 cm Test dose: negative  Assessment Sensory level: T8 Events: blood not aspirated, injection not painful, no injection resistance, negative IV test and no paresthesia  Additional Notes Patient identified. Risks/Benefits/Options discussed with patient including but not limited to bleeding, infection, nerve damage, paralysis, failed block, incomplete pain control, headache, blood pressure changes, nausea, vomiting, reactions to medication both or allergic, itching and postpartum back pain. Confirmed with bedside nurse the patient's most recent platelet count. Confirmed with patient that they are not currently taking any anticoagulation, have any bleeding history or any family history of bleeding disorders. Patient expressed understanding and wished to proceed. All questions were answered. Sterile technique was used throughout the entire procedure. Please see nursing notes for vital signs. Test dose was given through epidural catheter and negative prior to continuing to dose epidural or start infusion. Warning signs of high block given to the patient including shortness of breath, tingling/numbness in hands, complete motor block,  or any concerning symptoms with instructions to call for help. Patient was given instructions on fall risk and not to get out of bed. All questions and concerns addressed with instructions to call with any issues or inadequate analgesia.  Reason for block:procedure for pain

## 2019-01-31 ENCOUNTER — Encounter (HOSPITAL_COMMUNITY): Payer: Self-pay | Admitting: Obstetrics and Gynecology

## 2019-01-31 LAB — CBC
HCT: 33.9 % — ABNORMAL LOW (ref 36.0–46.0)
Hemoglobin: 11.4 g/dL — ABNORMAL LOW (ref 12.0–15.0)
MCH: 27.5 pg (ref 26.0–34.0)
MCHC: 33.6 g/dL (ref 30.0–36.0)
MCV: 81.7 fL (ref 80.0–100.0)
Platelets: 252 10*3/uL (ref 150–400)
RBC: 4.15 MIL/uL (ref 3.87–5.11)
RDW: 13.5 % (ref 11.5–15.5)
WBC: 12 10*3/uL — ABNORMAL HIGH (ref 4.0–10.5)
nRBC: 0 % (ref 0.0–0.2)

## 2019-01-31 LAB — RPR: RPR Ser Ql: NONREACTIVE

## 2019-01-31 MED ORDER — ZOLPIDEM TARTRATE 5 MG PO TABS: 5.0000 mg | ORAL_TABLET | Freq: Every evening | ORAL | Status: DC | PRN

## 2019-01-31 MED ORDER — SENNOSIDES-DOCUSATE SODIUM 8.6-50 MG PO TABS
2.0000 | ORAL_TABLET | ORAL | Status: DC
  Administered 2019-02-01: 2 via ORAL
  Filled 2019-01-31: qty 2

## 2019-01-31 MED ORDER — SIMETHICONE 80 MG PO CHEW: 80.0000 mg | CHEWABLE_TABLET | ORAL | Status: DC | PRN

## 2019-01-31 MED ORDER — IBUPROFEN 600 MG PO TABS
600.0000 mg | ORAL_TABLET | Freq: Four times a day (QID) | ORAL | Status: DC
  Administered 2019-01-31 – 2019-02-01 (×5): 600 mg via ORAL
  Filled 2019-01-31 (×5): qty 1

## 2019-01-31 MED ORDER — OXYCODONE HCL 5 MG PO TABS: 5.0000 mg | ORAL_TABLET | ORAL | Status: DC | PRN

## 2019-01-31 MED ORDER — WITCH HAZEL-GLYCERIN EX PADS: 1.0000 "application " | MEDICATED_PAD | CUTANEOUS | Status: DC | PRN

## 2019-01-31 MED ORDER — DIPHENHYDRAMINE HCL 25 MG PO CAPS: 25.0000 mg | ORAL_CAPSULE | Freq: Four times a day (QID) | ORAL | Status: DC | PRN

## 2019-01-31 MED ORDER — OXYCODONE HCL 5 MG PO TABS: 10.0000 mg | ORAL_TABLET | ORAL | Status: DC | PRN

## 2019-01-31 MED ORDER — DIBUCAINE (PERIANAL) 1 % EX OINT: 1.0000 "application " | TOPICAL_OINTMENT | CUTANEOUS | Status: DC | PRN

## 2019-01-31 MED ORDER — COCONUT OIL OIL: 1.0000 "application " | TOPICAL_OIL | Status: DC | PRN

## 2019-01-31 MED ORDER — TETANUS-DIPHTH-ACELL PERTUSSIS 5-2.5-18.5 LF-MCG/0.5 IM SUSP: 0.5000 mL | Freq: Once | INTRAMUSCULAR | Status: DC

## 2019-01-31 MED ORDER — ACETAMINOPHEN 325 MG PO TABS: 650.0000 mg | ORAL_TABLET | ORAL | Status: DC | PRN

## 2019-01-31 MED ORDER — ONDANSETRON HCL 4 MG/2ML IJ SOLN: 4.0000 mg | INTRAMUSCULAR | Status: DC | PRN

## 2019-01-31 MED ORDER — ONDANSETRON HCL 4 MG PO TABS: 4.0000 mg | ORAL_TABLET | ORAL | Status: DC | PRN

## 2019-01-31 MED ORDER — BENZOCAINE-MENTHOL 20-0.5 % EX AERO: 1.0000 "application " | INHALATION_SPRAY | CUTANEOUS | Status: DC | PRN

## 2019-01-31 MED ORDER — LACTATED RINGERS IV SOLN: INTRAVENOUS | Status: DC

## 2019-01-31 MED ORDER — PRENATAL MULTIVITAMIN CH
1.0000 | ORAL_TABLET | Freq: Every day | ORAL | Status: DC
  Administered 2019-01-31 – 2019-02-01 (×2): 1 via ORAL
  Filled 2019-01-31 (×2): qty 1

## 2019-01-31 NOTE — Lactation Note (Signed)
This note was copied from a baby's chart. Lactation Consultation Note:  P1, ETI at 37.3 weeks. Infant is 60 hours old.  Infant has been breastfed 3 times with latch score of 7.  Infant was gaggy when I arrived in the room. FOB picked infant up and attempt to use bulb syringe. Infant had slight clear fld in checks.   Basic breastfeeding teaching done. Mother has slender breast with LGT on the underside of her breast, with nipples pointing downward.   She denies having any breast changes during pregnancy .   Infant placed STS and in football position. Infant latched for 10 mins with suckles and a few swallows.  Teaching on observing infant with good depth and swallows.  Mother taught breast compression.   Assist with hand expression and infant was fed 1 ml ebm with a spoon.  Mother was given ETI parent instructions.  Reviewed cue base feeding . Advised to feed infant at least 8-12 times in 24 hours. Informed parents of cluster feeding and father doing STS.  Parents very receptive to all teaching.   Mother was given a harmony hand pump. She is not active with WIC and doesn't have a pump at home.  A pump kit was taken in the room. Staff nurse to sat pump up later this afternoon to stimulate milk volume .  [facility] brochure given and informed of OP services, 24 hour phone service and virtual breastfeeding support group.    Patient Name: Lynn Tucker ATFTD'D Date: 01/31/2019 Reason for consult: Initial assessment   Maternal Data    Feeding Feeding Type: Breast Milk  LATCH Score Latch: Grasps breast easily, tongue down, lips flanged, rhythmical sucking.  Audible Swallowing: A few with stimulation  Type of Nipple: Everted at rest and after stimulation  Comfort (Breast/Nipple): Soft / non-tender  Hold (Positioning): Assistance needed to correctly position infant at breast and maintain latch.  LATCH Score: 8  Interventions Interventions: Breast feeding basics reviewed;Assisted  with latch;Skin to skin;Hand express;Breast compression;Adjust position;Support pillows;Position options;Hand pump  Lactation Tools Discussed/Used WIC Program: No Pump Review: Setup, frequency, and cleaning Date initiated:: 01/31/19   Consult Status Consult Status: Follow-up Date: 02/01/19 Follow-up type: In-patient    Jess Barters [facility] 01/31/2019, 1:20 PM

## 2019-01-31 NOTE — Anesthesia Postprocedure Evaluation (Signed)
Anesthesia Post Note  Patient: Lynn Tucker  Procedure(s) Performed: AN AD HOC LABOR EPIDURAL     Patient location during evaluation: Mother Baby Anesthesia Type: Epidural Level of consciousness: awake and alert Pain management: pain level controlled Vital Signs Assessment: post-procedure vital signs reviewed and stable Respiratory status: spontaneous breathing, nonlabored ventilation and respiratory function stable Cardiovascular status: stable Postop Assessment: no headache, no backache and epidural receding Anesthetic complications: no    Last Vitals:  Vitals:   01/31/19 0327 01/31/19 0748  BP: (!) 104/58 103/71  Pulse: 73 69  Resp: 16   Temp:  (!) 36.4 C  SpO2: 100%     Last Pain:  Vitals:   01/31/19 0748  TempSrc: Oral  PainSc: 0-No pain   Pain Goal:                Epidural/Spinal Function Cutaneous sensation: Normal sensation (01/31/19 0748)  Barkley Boards

## 2019-01-31 NOTE — Progress Notes (Addendum)
Post Partum Day 0 Subjective: no complaints, up ad lib, voiding, tolerating PO, + flatus and breastfeeding and bonding well with baby. She denies HA/SOB/CP or dizziness. Has no complaints.   Objective: Blood pressure 103/71, pulse 69, temperature (!) 97.5 F (36.4 C), temperature source Oral, resp. rate 16, height 5' (1.524 m), weight 76.7 kg, SpO2 100 %, unknown if currently breastfeeding.  Physical Exam:  General: alert, cooperative and no distress Lochia: appropriate Uterine Fundus: firm Incision: n/a DVT Evaluation: No evidence of DVT seen on physical exam.  Recent Labs    01/30/19 1851 01/31/19 0528  HGB 13.2 11.4*  HCT 39.8 33.9*    Assessment/Plan: Breastfeeding  Routine pp care  FSBS wnl this am   LOS: 1 day   [place] 01/31/2019, 9:46 AM

## 2019-02-01 MED ORDER — IBUPROFEN 600 MG PO TABS: 600.0000 mg | ORAL_TABLET | Freq: Four times a day (QID) | ORAL | 1 refills | Status: DC | PRN

## 2019-02-01 NOTE — Progress Notes (Signed)
Post Partum Day 2 Subjective: no complaints, up ad lib, voiding, tolerating PO, + flatus and lochia mild. She is bonding well with baby - bottlefeeding. She denies HA/CP, fever or SOB. Feels ready for discharge to home  Objective: Blood pressure 108/64, pulse 60, temperature 97.8 F (36.6 C), temperature source Oral, resp. rate 16, height 5' (1.524 m), weight 76.7 kg, SpO2 100 %, unknown if currently breastfeeding.  Physical Exam:  General: alert, cooperative and no distress Lochia: appropriate Uterine Fundus: firm Incision: n/a DVT Evaluation: No evidence of DVT seen on physical exam.  Recent Labs    01/30/19 1851 01/31/19 0528  HGB 13.2 11.4*  HCT 39.8 33.9*    Assessment/Plan: Discharge home and Circumcision prior to discharge  Follow up in office in 6 weeks    LOS: 2 days   Cecilia W Bang[place] 02/01/2019, 10:22 AM

## 2019-02-01 NOTE — Clinical Social Work Maternal (Signed)
CLINICAL SOCIAL WORK MATERNAL/CHILD NOTE  Patient Details  Name: Lynn Tucker MRN: [id number] Date of Birth: 02/03/1991  Date:  02/01/2019  Clinical Social Worker Initiating Note:  Deanglo Hissong Date/Time: Initiated:  02/01/19/0934     Child's Name:  Lynn Tucker   Biological Parents:  Mother, Father(Lynn Tucker and Lynn Tucker DOB: 03/14/1992)   Need for Interpreter:  None   Reason for Referral:  Current Substance Use/Substance Use During Pregnancy    Address:  [address] [place] [address]    Phone number:  [phone] (home)     Additional phone number:   Household Members/Support Persons (HM/SP):   Household Member/Support Person 1   HM/SP Name Relationship DOB or Age  HM/SP -1 Lynn Tucker FOB [birth year]/14/1992  HM/SP -2        HM/SP -3        HM/SP -4        HM/SP -5        HM/SP -6        HM/SP -7        HM/SP -8          Natural Supports (not living in the home):      Professional Supports: None   Employment: Full-time   Type of Work: [employer]   Education:  College graduate   Homebound arranged:    Financial Resources:  Private Insurance   Other Resources:      Cultural/Religious Considerations Which May Impact Care:    Strengths:  Ability to meet basic needs , Home prepared for child , Pediatrician chosen   Psychotropic Medications:         Pediatrician:    [place] (including [place])  Pediatrician List:   [place]    [place]    [place]    [place]    [place] Other([facility])    Pediatrician Fax Number:    Risk Factors/Current Problems:      Cognitive State:  Able to Concentrate , Alert , Linear Thinking    Mood/Affect:  Bright , Calm , Comfortable , Interested , Relaxed    CSW Assessment:  CSW received consult for THC use in early pregnancy.  CSW met with MOB to offer support and complete  assessment.    MOB sitting up in bed holding infant with FOB asleep on the couch, when CSW entered the room. CSW introduced self and received verbal permission from MOB to complete assessment with FOB present. MOB very pleasant and was engaged throughout assessment. MOB and FOB, once awake, were observed to be well-bonded to and appropriate with infant. CSW explained reason for consult to which MOB was understanding. MOB reported she and FOB currently live together in [place] and MOB stated she works full-time at [employer]. CSW inquired about MOB's mental health history and MOB denied having any. CSW provided education regarding the baby blues period vs. perinatal mood disorders, discussed treatment and gave resources for mental health follow up if concerns arise.  CSW recommends self-evaluation during the postpartum time period using the New Mom Checklist from Postpartum Progress and encouraged MOB to contact a medical professional if symptoms are noted at any time. MOB did not appear to be displaying any acute mental health symptoms and denied any current SI or HI. MOB reported feeling well-supported by her parents.  CSW inquired about MOB's substance use history and MOB acknowledged using marijuana prior to finding out she was   pregnant. MOB denied any substance use after. CSW informed MOB of Hospital Drug Policy and explained CDS was still pending but that a CPS report would be made, if warranted. MOB and FOB expressed understanding and denied any questions or concerns regarding policy.  MOB confirmed having all essential items for infant once discharged and stated infant would be sleeping in a pack 'n' play once home. CSW provided review of Sudden Infant Death Syndrome (SIDS) precautions and safe sleeping habits.    CSW Plan/Description:  No Further Intervention Required/No Barriers to Discharge, Sudden Infant Death Syndrome (SIDS) Education, Perinatal Mood and Anxiety Disorder (PMADs)  Education, Hospital Drug Screen Policy Information, CSW Will Continue to Monitor Umbilical Cord Tissue Drug Screen Results and Make Report if Warranted    Catrinia Racicot, LCSWA 02/01/2019, 9:55 AM 

## 2019-02-01 NOTE — Lactation Note (Signed)
This note was copied from a baby's chart. Lactation Consultation Note  Patient Name: Lynn Tucker JYNWG'N Date: 02/01/2019 Reason for consult: Infant weight loss;Follow-up assessment;1st time breastfeeding;Primapara;Early term 37-38.6wks;Nipple pain/trauma(3 % weight loss/ post circ)  Baby is 51 hours old  As LC entered the room baby asleep in the crib and per mom  had recently attempted and baby sleepy from circ. Dad present too.  Parents expressed they felt confused with all the information they had received  For breastfeeding.  LC reviewed the Early term feeding plan of care, sore nipple and engorgement prevention and tx .  Mom had mentioned she had several breast changes with pregnancy and breast only changed in size slightly. Per mom a DEBP at home.  Mom also has a hand pump from the hospital, and [facility] showed her how to use the hand pump from the DEBP kit .  Per mom has only pumped x 1 with the DEBP and did not get any milk.  [place] reassured mom that is normal. Pumping extra is just to get the mature milk to come in quicker . Storage of breast milk reviewed page 89 mother an dbaby care booklet.  LC recommended since her baby is an early term infant until her milk comes in to post pump after at least 5 feedings a day both breast for 10 -15 mins , save milk to be feed back to baby.  Steps for latching to prevent soreness from increasing.  Breast massage, hand express, pre-pump prior to every feeding with hand pump as shown and reverse pressure until the areolas are more compressible  For a deeper latch.  LC explored options - due to the baby being an early term infant.  Feed with feeding cues and by 3 hours  Attempt to latch.  STS feedings until the baby is back to birth weight and gaining steadily.  Steps for latching ( stated above ).  ( if baby is wide awake and finishes the 1st breast . Offer the 2nd breast  If not is EBM is available supplement either with a spoon ( if small amount  )  PACE feed as described with Dr. Owens Tucker nipple.  Breastfeeding goal is 8-12 feedings a day.  Mom has the [facility] pamphlet with phone number.   Mom and dad expressed appreciation for breast feeding review.    Maternal Data Has patient been taught Hand Expression?: Yes Does the patient have breastfeeding experience prior to this delivery?: No  Feeding Feeding Type: (per attempted after circ and to sleepy)  LATCH Score                   Interventions Interventions: Breast feeding basics reviewed;Coconut oil;Shells;Hand pump;DEBP  Lactation Tools Discussed/Used Tools: Shells;Pump;Flanges;Coconut oil Flange Size: 27;24 Shell Type: Inverted Breast pump type: Manual;Double-Electric Breast Pump Pump Review: Setup, frequency, and cleaning;Milk Storage Initiated by:: MAI - reviewed 10/24   Consult Status Consult Status: Complete Date: 02/01/19    Lynn Tucker 02/01/2019, 1:09 PM

## 2019-02-01 NOTE — Discharge Instructions (Signed)
Call office with any concerns [phone] 

## 2019-02-01 NOTE — Discharge Summary (Signed)
OB Discharge Summary     Patient Name: Lynn Tucker DOB: [birth year] MRN: [id number]  Date of admission: 01/30/2019 Delivering MD: Janyth Contes   Date of discharge: 02/01/2019  Admitting diagnosis: CTX 4-7 min Intrauterine pregnancy: [redacted]w[redacted]d     Secondary diagnosis:  Principal Problem:   SVD (spontaneous vaginal delivery) Active Problems:   Labor and delivery, indication for care  Additional problems: none     Discharge diagnosis: Term Pregnancy Delivered and GDM A1                                                                                                Post partum procedures:none  Augmentation: AROM  Complications: None  Hospital course:  Onset of Labor With Vaginal Delivery     28 y.o. yo [id number] at [redacted]w[redacted]d was admitted in Latent Labor on 01/30/2019. Patient had an uncomplicated labor course as follows:  Membrane Rupture Time/Date: 7:35 PM ,01/30/2019   Intrapartum Procedures: Episiotomy: None [1]                                         Lacerations:  Periurethral [8]  Patient had a delivery of a Viable infant. 01/31/2019  Information for the patient's newborn:  Avaa, Ditomaso [id number]       Pateint had an uncomplicated postpartum course.  She is ambulating, tolerating a regular diet, passing flatus, and urinating well. Patient is discharged home in stable condition on 02/01/19.   Physical exam  Vitals:   01/31/19 1132 01/31/19 1436 01/31/19 2115 02/01/19 0540  BP: 100/63 103/67 106/65 108/64  Pulse: 64 63 63 60  Resp:   18 16  Temp: 98.1 F (36.7 C) 98.3 F (36.8 C) 98.7 F (37.1 C) 97.8 F (36.6 C)  TempSrc:   Oral Oral  SpO2:   100%   Weight:      Height:       General: alert, cooperative and no distress Lochia: appropriate Uterine Fundus: firm Incision: N/A DVT Evaluation: Negative Homan's sign. Labs: Lab Results  Component Value Date   WBC 12.0 (H) 01/31/2019   HGB 11.4 (L) 01/31/2019   HCT 33.9 (L) 01/31/2019   MCV  81.7 01/31/2019   PLT 252 01/31/2019   CMP Latest Ref Rng & Units 03/13/2016  Glucose 65 - 99 mg/dL 96  BUN 7 - 25 mg/dL 9  Creatinine 0.50 - 1.10 mg/dL 0.83  Sodium 135 - 146 mmol/L 138  Potassium 3.5 - 5.3 mmol/L 3.9  Chloride 98 - 110 mmol/L 103  CO2 20 - 31 mmol/L 27  Calcium 8.6 - 10.2 mg/dL 9.4  Total Protein 6.1 - 8.1 g/dL 7.8  Total Bilirubin 0.2 - 1.2 mg/dL 0.5  Alkaline Phos 33 - 115 U/L 55  AST 10 - 30 U/L 28  ALT 6 - 29 U/L 24    Discharge instruction: per After Visit Summary and "Baby and Me Booklet".  After visit meds:  Allergies as of 02/01/2019      Reactions  Metformin And Related    Causes oily stool & cramps      Medication List    STOP taking these medications   progesterone 200 MG Supp     TAKE these medications   acetaminophen 500 MG tablet Commonly known as: TYLENOL Take 500 mg by mouth every 6 (six) hours as needed for mild pain.   ibuprofen 600 MG tablet Commonly known as: ADVIL Take 1 tablet (600 mg total) by mouth every 6 (six) hours as needed for moderate pain or cramping.   multivitamin-prenatal 27-0.8 MG Tabs tablet Take 1 tablet by mouth at bedtime.       Diet: routine diet  Activity: Advance as tolerated. Pelvic rest for 6 weeks.   Outpatient follow up:6 weeks Follow up Appt:No future appointments. Follow up Visit:No follow-ups on file.  Postpartum contraception: Not Discussed  Newborn Data: Live born female  Birth Weight: 6 lb 0.5 oz (2735 g) APGAR: 8, 9  Newborn Delivery   Birth date/time: 01/31/2019 00:52:00 Delivery type: Vaginal, Spontaneous      Baby Feeding: Bottle Disposition:home with mother   02/01/2019 [name], DO

## 2019-02-03 LAB — SURGICAL PATHOLOGY

## 2019-02-11 ENCOUNTER — Telehealth: Payer: Self-pay | Admitting: Medical

## 2019-02-11 NOTE — Telephone Encounter (Signed)
Please call and congratulate her on the new baby and see how she is doing?

## 2019-02-12 NOTE — Telephone Encounter (Signed)
Done

## 2019-04-29 ENCOUNTER — Encounter (HOSPITAL_COMMUNITY): Payer: Self-pay | Admitting: Obstetrics and Gynecology

## 2019-05-01 ENCOUNTER — Encounter: Payer: Commercial Managed Care - PPO | Admitting: Medical

## 2019-05-05 ENCOUNTER — Encounter: Payer: Self-pay | Admitting: Medical

## 2020-04-13 ENCOUNTER — Other Ambulatory Visit (INDEPENDENT_AMBULATORY_CARE_PROVIDER_SITE_OTHER): Payer: Commercial Managed Care - PPO

## 2020-04-13 ENCOUNTER — Other Ambulatory Visit: Payer: Self-pay

## 2020-04-13 ENCOUNTER — Encounter: Payer: Self-pay | Admitting: Medical

## 2020-04-13 ENCOUNTER — Telehealth: Payer: Commercial Managed Care - PPO | Admitting: Medical

## 2020-04-13 VITALS — Ht 60.0 in | Wt 177.0 lb

## 2020-04-13 DIAGNOSIS — R059 Cough, unspecified: Secondary | ICD-10-CM

## 2020-04-13 DIAGNOSIS — R0602 Shortness of breath: Secondary | ICD-10-CM

## 2020-04-13 DIAGNOSIS — R0981 Nasal congestion: Secondary | ICD-10-CM | POA: Diagnosis not present

## 2020-04-13 LAB — POC COVID19 BINAXNOW: SARS Coronavirus 2 Ag: NEGATIVE

## 2020-04-13 MED ORDER — ALBUTEROL SULFATE HFA 108 (90 BASE) MCG/ACT IN AERS: 2.0000 | INHALATION_SPRAY | Freq: Four times a day (QID) | RESPIRATORY_TRACT | 0 refills | Status: DC | PRN

## 2020-04-13 NOTE — Progress Notes (Signed)
Subjective:     Patient ID: Lynn Tucker, female   DOB: 1991/02/21, 30 y.o.   MRN: [id number]  This visit type was conducted due to national recommendations for restrictions regarding the COVID-19 Pandemic (e.g. social distancing) in an effort to limit this patient's exposure and mitigate transmission in our community.  Due to their co-morbid illnesses, this patient is at least at moderate risk for complications without adequate follow up.  This format is felt to be most appropriate for this patient at this time.    Documentation for virtual audio and video telecommunications through [place] encounter:  The patient was located at home. The provider was located in the office. The patient did consent to this visit and is aware of possible charges through their insurance for this visit.  The other persons participating in this telemedicine service were none. Time spent on call was 20 minutes and in review of previous records 20 minutes total.  This virtual service is not related to other E/M service within previous 7 days.   HPI Chief Complaint  Patient presents with  . URI    Sob, runny nose, sore throat at night. Previous body ache. Symptom started 04/06/20. Was not tested for COVID    Virtual consult for illness.   Started a week ago, sick out of the blue.  Had not been around people to catch anything.   She notes having sore throat, runny nose, has felt SOB, body aches.  Never had fever.   No loss of smell or taste.   No wheezing.  Did feel some chest pressure.  No NVD.  Has some cough, but that has improved some as well as body aches.  Currently using Claritin some, and this helped some.  Using some tylenol.  During the day ok, but at night seems to worsen.  Last night was the worse, as heart was beating harder ,felt anxious.    Fluid intake is good.  Trying to eat plant based diet and being healthier.     Has not had covid test.  Has had covid vaccine but not booster.   No  other aggravating or relieving factors. No other complaint.   Past Medical History:  Diagnosis Date  . ADHD (attention deficit hyperactivity disorder)   . Chlamydia 11/11/2015  . H. pylori infection 1/16   off antibiotics at this time  . Hyperlipidemia   . Impaired fasting glucose    2013  . Molar pregnancy 04/25/2017  . Obesity   . Short cervix during pregnancy in second trimester 09/21/2018  . Sickle cell trait ([place])   . SVD (spontaneous vaginal delivery) 01/31/2019  . VSD (ventricular septal defect)    followed by cardiology every 2 years  . Wears glasses    Current Outpatient Medications on File Prior to Visit  Medication Sig Dispense Refill  . acetaminophen (TYLENOL) 500 MG tablet Take 500 mg by mouth every 6 (six) hours as needed for mild pain. (Patient not taking: Reported on 04/13/2020)    . ibuprofen (ADVIL) 600 MG tablet Take 1 tablet (600 mg total) by mouth every 6 (six) hours as needed for moderate pain or cramping. (Patient not taking: Reported on 04/13/2020) 40 tablet 1  . Prenatal Vit-Fe Fumarate-FA (MULTIVITAMIN-PRENATAL) 27-0.8 MG TABS tablet Take 1 tablet by mouth at bedtime. (Patient not taking: Reported on 04/13/2020)     No current facility-administered medications on file prior to visit.     Review of Systems As in subjective  Objective:   Physical Exam Due to coronavirus pandemic stay at home measures, patient visit was virtual and they were not examined in person.   Ht 5' (1.524 m)   Wt 177 lb (80.3 kg)   Breastfeeding No   BMI 34.57 kg/m   Well-developed, well-nourished, no acute distress No obvious wheezing or dyspnea Answers questions in complete sentences    Assessment:     Encounter Diagnoses  Name Primary?  . Cough Yes  . SOB (shortness of breath)   . Head congestion        Plan:     She is fully vaccinated with respiratory tract infection symptoms/bronchitis symptoms.  Begin albuterol below.  She used this in the past with similar  symptoms.  Advise she had over-the-counter Robitussin-DM for cough and congestion, continue with good hydration and rest, Tylenol as needed for fever body aches or not feeling well.  She should be in the latter stages of this illness on day 6 now.  If negative for Covid testing she can return to work.  If positive, we discussed appropriate quarantine this week.  Advised if not improving despite albuterol over the next few days then call back  Lynn Tucker was seen today for uri.  Diagnoses and all orders for this visit:  Cough -     POC COVID-19 BinaxNow; Future  SOB (shortness of breath) -     POC COVID-19 BinaxNow; Future  Head congestion -     POC COVID-19 BinaxNow; Future  Other orders -     albuterol (VENTOLIN HFA) 108 (90 Base) MCG/ACT inhaler; Inhale 2 puffs into the lungs every 6 (six) hours as needed for wheezing or shortness of breath.  Follow-up this morning in our back parking lot for Covid screen

## 2020-05-03 ENCOUNTER — Telehealth: Payer: Self-pay

## 2020-05-03 NOTE — Telephone Encounter (Signed)
Pt. Called stating that she tested positive for covid on 04/28/20 and has an apt on 05/05/20 she wanted to know if she could get tested for covid here today and if she still needed to come in Wednesday for her apt.

## 2020-05-03 NOTE — Telephone Encounter (Signed)
Pt aware.

## 2020-05-03 NOTE — Telephone Encounter (Signed)
I do not feel she needs a repeat test necessarily unless her employer is requiring this.  If she was going to do a repeat test that can be the antigen as the PCR can remain positive for at least 90 days  She just needs to make sure she practiced a long enough isolation and quarantine, minimum of 5 days.  So 5 days plus however many days until no significant cough, no fever, no sneezing congested symptoms.

## 2020-05-05 ENCOUNTER — Ambulatory Visit (INDEPENDENT_AMBULATORY_CARE_PROVIDER_SITE_OTHER): Payer: Commercial Managed Care - PPO | Admitting: Medical

## 2020-05-05 ENCOUNTER — Other Ambulatory Visit: Payer: Self-pay

## 2020-05-05 ENCOUNTER — Encounter: Payer: Self-pay | Admitting: Medical

## 2020-05-05 ENCOUNTER — Other Ambulatory Visit: Payer: Self-pay | Admitting: Medical

## 2020-05-05 VITALS — BP 114/70 | HR 81 | Ht 60.0 in | Wt 183.0 lb

## 2020-05-05 DIAGNOSIS — L83 Acanthosis nigricans: Secondary | ICD-10-CM

## 2020-05-05 DIAGNOSIS — E669 Obesity, unspecified: Secondary | ICD-10-CM | POA: Diagnosis not present

## 2020-05-05 DIAGNOSIS — E785 Hyperlipidemia, unspecified: Secondary | ICD-10-CM

## 2020-05-05 DIAGNOSIS — R7303 Prediabetes: Secondary | ICD-10-CM

## 2020-05-05 DIAGNOSIS — R7301 Impaired fasting glucose: Secondary | ICD-10-CM

## 2020-05-05 DIAGNOSIS — Z7185 Encounter for immunization safety counseling: Secondary | ICD-10-CM

## 2020-05-05 DIAGNOSIS — L732 Hidradenitis suppurativa: Secondary | ICD-10-CM

## 2020-05-05 LAB — POCT URINALYSIS DIP (PROADVANTAGE DEVICE)
Bilirubin, UA: NEGATIVE
Blood, UA: NEGATIVE
Glucose, UA: NEGATIVE mg/dL
Ketones, POC UA: NEGATIVE mg/dL
Leukocytes, UA: NEGATIVE
Nitrite, UA: NEGATIVE
Protein Ur, POC: NEGATIVE mg/dL
Specific Gravity, Urine: 1.015
Urobilinogen, Ur: 0.2
pH, UA: 7 (ref 5.0–8.0)

## 2020-05-05 MED ORDER — SULFAMETHOXAZOLE-TRIMETHOPRIM 800-160 MG PO TABS: 1.0000 | ORAL_TABLET | Freq: Two times a day (BID) | ORAL | 0 refills | Status: DC

## 2020-05-05 NOTE — Progress Notes (Signed)
Subjective:  Lynn Tucker is a 30 y.o. female who presents for Chief Complaint  Patient presents with  . Prediabetes     Here for med check, last in person visit over a year ago.  Since her last visit here she has a 82-year-old baby.  She is not sure if she got a tetanus vaccine at that time or not.  She has had both Covid vaccines.  She declines flu shot  Here for recheck on prediabetes.  She checks her blood sugars occasionally, usually between 90 and 100 fasting.  Otherwise in normal state of health.  Just recently started plant based diet.  She does get red irritated bumps under her breast.  No drainage.  She is worried about hidradenitis  No other aggravating or relieving factors.    No other c/o.  Past Medical History:  Diagnosis Date  . ADHD (attention deficit hyperactivity disorder)   . Chlamydia 11/11/2015  . H. pylori infection 1/16   off antibiotics at this time  . Hyperlipidemia   . Impaired fasting glucose    2013  . Molar pregnancy 04/25/2017  . Obesity   . Short cervix during pregnancy in second trimester 09/21/2018  . Sickle cell trait ([place])   . SVD (spontaneous vaginal delivery) 01/31/2019  . VSD (ventricular septal defect)    followed by cardiology every 2 years  . Wears glasses      The following portions of the patient's history were reviewed and updated as appropriate: allergies, current medications, past family history, past medical history, past social history, past surgical history and problem list.  ROS Otherwise as in subjective above  Objective: BP 114/70   Pulse 81   Ht 5' (1.524 m)   Wt 183 lb (83 kg)   SpO2 98%   BMI 35.74 kg/m   General appearance: alert, no distress, well developed, well nourished Neck: supple, no lymphadenopathy, no thyromegaly, no masses Heart: RRR, normal S1, S2, no murmurs Lungs: CTA bilaterally, no wheezes, rhonchi, or rales Abdomen: +bs, soft, non tender, non distended, no masses, no hepatomegaly, no  splenomegaly Pulses: 2+ radial pulses, 2+ pedal pulses, normal cap refill Ext: no edema Inferior to bilateral breast in the crease is several somewhat tender papular erythematous lesions.  No drainage, no pustules.   Diabetic Foot Exam - Simple   Simple Foot Form Diabetic Foot exam was performed with the following findings: Yes 05/05/2020  1:12 PM  Visual Inspection No deformities, no ulcerations, no other skin breakdown bilaterally: Yes Sensation Testing Intact to touch and monofilament testing bilaterally: Yes Pulse Check Posterior Tibialis and Dorsalis pulse intact bilaterally: Yes Comments       Assessment: Encounter Diagnoses  Name Primary?  . Impaired fasting glucose Yes  . Acanthosis   . Prediabetes   . Obesity (BMI 30-39.9)   . Hyperlipidemia, unspecified hyperlipidemia type   . Vaccine counseling   . Hidradenitis      Plan: Impaired glucose/prediabetes-routine labs today.  Discussed the need for more frequent follow-up.  Discussed diet, exercise, possible complications of diabetes  Continue efforts to lose weight through healthy diet and exercise  History of elevated cholesterol-recheck labs today  Hidradenitis-work on efforts to lose weight, discussed the role of diabetes or prediabetes, begin trial of Bactrim antibiotic oral  We discussed vaccine recommendations.  She probably had a Tdap booster last year but cannot recall.  She will check her records  Lynn Tucker was seen today for prediabetes.  Diagnoses and all orders for this  visit:  Impaired fasting glucose -     Comprehensive metabolic panel -     Lipid panel -     Hemoglobin A1c -     CBC -     POCT Urinalysis DIP (Proadvantage Device)  Acanthosis -     CBC  Prediabetes -     Comprehensive metabolic panel -     Hemoglobin A1c -     POCT Urinalysis DIP (Proadvantage Device)  Obesity (BMI 30-39.9) -     Comprehensive metabolic panel -     Hemoglobin A1c  Hyperlipidemia, unspecified  hyperlipidemia type -     Lipid panel  Vaccine counseling  Hidradenitis  Other orders -     sulfamethoxazole-trimethoprim (BACTRIM DS) 800-160 MG tablet; Take 1 tablet by mouth 2 (two) times daily.    Follow up: pending labs

## 2020-05-06 LAB — CBC
Hematocrit: 34.4 % (ref 34.0–46.6)
Hemoglobin: 11.3 g/dL (ref 11.1–15.9)
MCH: 25.3 pg — ABNORMAL LOW (ref 26.6–33.0)
MCHC: 32.8 g/dL (ref 31.5–35.7)
MCV: 77 fL — ABNORMAL LOW (ref 79–97)
Platelets: 404 10*3/uL (ref 150–450)
RBC: 4.46 x10E6/uL (ref 3.77–5.28)
RDW: 15.9 % — ABNORMAL HIGH (ref 11.7–15.4)
WBC: 4.7 10*3/uL (ref 3.4–10.8)

## 2020-05-06 LAB — COMPREHENSIVE METABOLIC PANEL
ALT: 21 IU/L (ref 0–32)
AST: 28 IU/L (ref 0–40)
Albumin/Globulin Ratio: 1.3 (ref 1.2–2.2)
Albumin: 4.7 g/dL (ref 3.9–5.0)
Alkaline Phosphatase: 79 IU/L (ref 44–121)
BUN/Creatinine Ratio: 7 — ABNORMAL LOW (ref 9–23)
BUN: 6 mg/dL (ref 6–20)
Bilirubin Total: 0.6 mg/dL (ref 0.0–1.2)
CO2: 23 mmol/L (ref 20–29)
Calcium: 9.9 mg/dL (ref 8.7–10.2)
Chloride: 99 mmol/L (ref 96–106)
Creatinine, Ser: 0.87 mg/dL (ref 0.57–1.00)
GFR calc Af Amer: 105 mL/min/{1.73_m2} (ref >59)
GFR calc non Af Amer: 91 mL/min/{1.73_m2} (ref >59)
Globulin, Total: 3.7 g/dL (ref 1.5–4.5)
Glucose: 96 mg/dL (ref 65–99)
Potassium: 4.5 mmol/L (ref 3.5–5.2)
Sodium: 136 mmol/L (ref 134–144)
Total Protein: 8.4 g/dL (ref 6.0–8.5)

## 2020-05-06 LAB — LIPID PANEL
Chol/HDL Ratio: 4.9 ratio — ABNORMAL HIGH (ref 0.0–4.4)
Cholesterol, Total: 181 mg/dL (ref 100–199)
HDL: 37 mg/dL — ABNORMAL LOW (ref >39)
LDL Chol Calc (NIH): 126 mg/dL — ABNORMAL HIGH (ref 0–99)
Triglycerides: 99 mg/dL (ref 0–149)
VLDL Cholesterol Cal: 18 mg/dL (ref 5–40)

## 2020-05-06 LAB — HEMOGLOBIN A1C
Est. average glucose Bld gHb Est-mCnc: 146 mg/dL
Hgb A1c MFr Bld: 6.7 % — ABNORMAL HIGH (ref 4.8–5.6)

## 2020-05-11 ENCOUNTER — Other Ambulatory Visit: Payer: Self-pay | Admitting: Medical

## 2020-05-11 MED ORDER — OZEMPIC (0.25 OR 0.5 MG/DOSE) 2 MG/1.5ML ~~LOC~~ SOPN: 0.5000 mg | PEN_INJECTOR | SUBCUTANEOUS | 2 refills | Status: DC

## 2020-05-11 NOTE — Progress Notes (Signed)
empi

## 2020-05-12 ENCOUNTER — Other Ambulatory Visit: Payer: Self-pay | Admitting: Medical

## 2020-05-12 MED ORDER — EXENATIDE ER 2 MG ~~LOC~~ PEN: 2.0000 mg | PEN_INJECTOR | SUBCUTANEOUS | 1 refills | Status: DC

## 2020-05-12 MED ORDER — BD PEN NEEDLE NANO U/F 32G X 4 MM MISC: 1.0000 | Freq: Every day | 1 refills | Status: DC

## 2020-05-25 ENCOUNTER — Other Ambulatory Visit: Payer: Self-pay | Admitting: Medical

## 2020-05-25 MED ORDER — PHENTERMINE HCL 37.5 MG PO TABS: 37.5000 mg | ORAL_TABLET | Freq: Every day | ORAL | 1 refills | Status: DC

## 2020-06-10 ENCOUNTER — Ambulatory Visit (INDEPENDENT_AMBULATORY_CARE_PROVIDER_SITE_OTHER): Payer: Commercial Managed Care - PPO | Admitting: Medical

## 2020-06-10 ENCOUNTER — Other Ambulatory Visit: Payer: Self-pay

## 2020-06-10 ENCOUNTER — Encounter: Payer: Self-pay | Admitting: Medical

## 2020-06-10 ENCOUNTER — Telehealth: Payer: Self-pay | Admitting: Medical

## 2020-06-10 VITALS — BP 112/62 | HR 88 | Ht 60.0 in | Wt 174.2 lb

## 2020-06-10 DIAGNOSIS — R942 Abnormal results of pulmonary function studies: Secondary | ICD-10-CM

## 2020-06-10 DIAGNOSIS — Z1329 Encounter for screening for other suspected endocrine disorder: Secondary | ICD-10-CM

## 2020-06-10 DIAGNOSIS — Z7185 Encounter for immunization safety counseling: Secondary | ICD-10-CM

## 2020-06-10 DIAGNOSIS — Q210 Ventricular septal defect: Secondary | ICD-10-CM

## 2020-06-10 DIAGNOSIS — L732 Hidradenitis suppurativa: Secondary | ICD-10-CM

## 2020-06-10 DIAGNOSIS — R7301 Impaired fasting glucose: Secondary | ICD-10-CM

## 2020-06-10 DIAGNOSIS — Z130 Encounter for screening for diseases of the blood and blood-forming organs and certain disorders involving the immune mechanism: Secondary | ICD-10-CM

## 2020-06-10 DIAGNOSIS — R7303 Prediabetes: Secondary | ICD-10-CM

## 2020-06-10 DIAGNOSIS — Z0000 Encounter for general adult medical examination without abnormal findings: Secondary | ICD-10-CM | POA: Diagnosis not present

## 2020-06-10 DIAGNOSIS — R0600 Dyspnea, unspecified: Secondary | ICD-10-CM | POA: Diagnosis not present

## 2020-06-10 DIAGNOSIS — E669 Obesity, unspecified: Secondary | ICD-10-CM

## 2020-06-10 DIAGNOSIS — E785 Hyperlipidemia, unspecified: Secondary | ICD-10-CM | POA: Diagnosis not present

## 2020-06-10 DIAGNOSIS — L83 Acanthosis nigricans: Secondary | ICD-10-CM

## 2020-06-10 DIAGNOSIS — Z2820 Immunization not carried out because of patient decision for unspecified reason: Secondary | ICD-10-CM

## 2020-06-10 MED ORDER — BREO ELLIPTA 100-25 MCG/INH IN AEPB: 1.0000 | INHALATION_SPRAY | Freq: Every day | RESPIRATORY_TRACT | 0 refills | Status: DC

## 2020-06-10 NOTE — Patient Instructions (Signed)
Please go to [facility] for your chest xray.   Their hours are 8am - 4:30 pm Monday - Friday.  Take your insurance card with you. ° °[facility] °[phone] ° °[address], [address] °[place], [place] [address] ° °[address] °[place], [place] [address] ° ° °

## 2020-06-10 NOTE — Addendum Note (Signed)
Addended by: Carlena Hurl on: 06/10/2020 12:35 PM   Modules accepted: Orders

## 2020-06-10 NOTE — Progress Notes (Addendum)
Subjective:   HPI  Lynn Tucker is a 30 y.o. female who presents for Chief Complaint  Patient presents with  . Annual Exam    Physical with fasting labs     Patient Care Team: Zebbie Ace, Camelia Eng, PA-C as PCP - General (Family Medicine) Sees dentist Sees eye doctor [facility]   Concerns: Since last visit tried phentermine but every time she takes it seems to get anxiety, lump in throat sensation and dry mouth.   Impaired glucose/borderline diabetes - eating plant based diet, exercising some.  Eats healthy.   having some dyspnea, feels like can't get full breath. No cough fits, no fever, no recent URI illness. Been having some runny nose   Past Medical History:  Diagnosis Date  . ADHD (attention deficit hyperactivity disorder)   . Chlamydia 11/11/2015  . H. pylori infection 1/16   off antibiotics at this time  . Hyperlipidemia   . Impaired fasting glucose    2013  . Molar pregnancy 04/25/2017  . Obesity   . Short cervix during pregnancy in second trimester 09/21/2018  . Sickle cell trait ([place])   . SVD (spontaneous vaginal delivery) 01/31/2019  . VSD (ventricular septal defect)    followed by cardiology every 2 years  . Wears glasses     Family History  Problem Relation Age of Onset  . Hypertension Mother   . Sickle cell trait Father   . Hypertension Father   . Cancer Other        maternal side-? type  . Hyperlipidemia Other        ? side  . Stroke Other        paternal side  . Diabetes Other        paternal and maternal side  . Thyroid disease Paternal Aunt      Current Outpatient Medications:  .  fluticasone furoate-vilanterol (BREO ELLIPTA) 100-25 MCG/INH AEPB, Inhale 1 puff into the lungs daily., Disp: 1 each, Rfl: 0 .  albuterol (VENTOLIN HFA) 108 (90 Base) MCG/ACT inhaler, INHALE 2 PUFFS INTO THE LUNGS EVERY 6 HOURS AS NEEDED FOR WHEEZING OR SHORTNESS OF BREATH (Patient not taking: Reported on 06/10/2020), Disp: 8.5 g, Rfl: 0 .  phentermine  (ADIPEX-P) 37.5 MG tablet, Take 1 tablet (37.5 mg total) by mouth daily before breakfast. (Patient not taking: Reported on 06/10/2020), Disp: 30 tablet, Rfl: 1  Allergies  Allergen Reactions  . Metformin And Related     Causes oily stool & cramps  . Phentermine     Anxiety, dry mouth, globus sensation    Reviewed their medical, surgical, family, social, medication, and allergy history and updated chart as appropriate.   Review of Systems Constitutional: -fever, -chills, -sweats, -unexpected weight change, -decreased appetite, -fatigue Allergy: -sneezing, -itching, -congestion Dermatology: -changing moles, --rash, -lumps ENT: -runny nose, -ear pain, -sore throat, -hoarseness, -sinus pain, -teeth pain, - ringing in ears, -hearing loss, -nosebleeds Cardiology: -chest pain, -palpitations, -swelling, -difficulty breathing when lying flat, -waking up short of breath Respiratory: -cough, +shortness of breath, -difficulty breathing with exercise or exertion, -wheezing, -coughing up blood Gastroenterology: -abdominal pain, -nausea, -vomiting, -diarrhea, -constipation, -blood in stool, -changes in bowel movement, -difficulty swallowing or eating Hematology: -bleeding, -bruising  Musculoskeletal: -joint aches, -muscle aches, -joint swelling, -back pain, -neck pain, -cramping, -changes in gait Ophthalmology: denies vision changes, eye redness, itching, discharge Urology: -burning with urination, -difficulty urinating, -blood in urine, -urinary frequency, -urgency, -incontinence Neurology: -headache, -weakness, -tingling, -numbness, -memory loss, -falls, -dizziness Psychology: -depressed mood, -  agitation, -sleep problems Breast/gyn: -breast tendnerss, -discharge, -lumps, -vaginal discharge,- irregular periods, -heavy periods     Objective:  BP 112/62   Pulse 88   Ht 5' (1.524 m)   Wt 174 lb 3.2 oz (79 kg)   SpO2 98%   BMI 34.02 kg/m   General appearance: alert, no distress, WD/WN, African  American female Skin: unremarkable HEENT: normocephalic, conjunctiva/corneas normal, sclerae anicteric, PERRLA, EOMi, nares patent, no discharge or erythema, pharynx normal Oral cavity: MMM, tongue normal, teeth normal Neck: supple, no lymphadenopathy, no thyromegaly, no masses, normal ROM, no bruits Chest: non tender, normal shape and expansion Heart: RRR, normal S1, S2, no murmurs Lungs: CTA bilaterally, no wheezes, rhonchi, or rales Abdomen: +bs, soft, non tender, non distended, no masses, no hepatomegaly, no splenomegaly, no bruits Back: non tender, normal ROM, no scoliosis Musculoskeletal: upper extremities non tender, no obvious deformity, normal ROM throughout, lower extremities non tender, no obvious deformity, normal ROM throughout Extremities: no edema, no cyanosis, no clubbing Pulses: 2+ symmetric, upper and lower extremities, normal cap refill Neurological: alert, oriented x 3, CN2-12 intact, strength normal upper extremities and lower extremities, sensation normal throughout, DTRs 2+ throughout, no cerebellar signs, gait normal Psychiatric: normal affect, behavior normal, pleasant  Breast/gyn/rectal - deferred to gynecology  Diabetic Foot Exam - Simple   Simple Foot Form Diabetic Foot exam was performed with the following findings: Yes 06/10/2020 11:58 AM  Visual Inspection No deformities, no ulcerations, no other skin breakdown bilaterally: Yes Sensation Testing Intact to touch and monofilament testing bilaterally: Yes Pulse Check Posterior Tibialis and Dorsalis pulse intact bilaterally: Yes Comments      Assessment and Plan :   Encounter Diagnoses  Name Primary?  . Encounter for health maintenance examination in adult Yes  . Impaired fasting glucose   . Hyperlipidemia, unspecified hyperlipidemia type   . Dyspnea, unspecified type   . Screening for thyroid disorder   . Screening for deficiency anemia   . Vaccine refused by patient   . Vaccine counseling   .  Prediabetes   . Obesity (BMI 30-39.9)   . Hidradenitis   . Acanthosis   . VSD (ventricular septal defect)   . Abnormal PFT     Today you had a preventative care visit or wellness visit.    Topics today may have included healthy lifestyle, diet, exercise, preventative care, vaccinations, sick and well care, proper use of emergency dept and after hours care, as well as other concerns.     Recommendations: Continue to return yearly for your annual wellness and preventative care visits.  This gives [place] a chance to discuss healthy lifestyle, exercise, vaccinations, review your chart record, and perform screenings where appropriate.  I recommend you see your eye doctor yearly for routine vision care.  I recommend you see your dentist yearly for routine dental care including hygiene visits twice yearly.   Vaccination recommendations were reviewed  She will get [place] copy of her COVID vaccine information.  She is up-to-date on tetanus per pregnancy last year.  We will request copy of this as well  advised yearly flu shot.     Screening for cancer: Breast cancer screening: You should perform a self breast exam monthly.   We reviewed recommendations for breast cancer screening.  Colon cancer screening:  Age 5  Cervical cancer screening: We reviewed recommendations for pap smear screening.  We will request 2021 pap smear from gynecology  Skin cancer screening: Check your skin regularly for new changes, growing lesions,  or other lesions of concern Come in for evaluation if you have skin lesions of concern.  Lung cancer screening: If you have a greater than 30 pack year history of tobacco use, then you qualify for lung cancer screening with a chest CT scan  We currently don't have screenings for other cancers besides breast, cervical, colon, and lung cancers.  If you have a strong family history of cancer or have other cancer screening concerns, please let me know.    Bone health: Get  at least 150 minutes of aerobic exercise weekly Get weight bearing exercise at least once weekly    Heart health: Get at least 150 minutes of aerobic exercise weekly Limit alcohol It is important to maintain a healthy blood pressure and healthy cholesterol numbers   Separate significant issues discussed: Prediabetes, impaired fasting glucose-she is eating a plant-based diet, and I suspect her sugars are not controlled due to insulin resistance as well as pancreatic insufficiency.  She has not tolerated Metformin in the past.  She also could stand to lose some weight.  We had tried to get some other medicines recently approved by insurance to help with both glucose control and weight loss but insurance is declining to cover those.  She did not tolerate phentermine due to side effects.  We will work to find some medication to help with sugar control as well as weight loss.  We discussed the risk of progression to diabetes and preventative measures.  Discussed diet, exercise.  Hyperlipidemia-counseled on diet and exercise changes.  No medicine indication at this time  Dyspnea-abnormal PFT suggesting new onset asthma.  She will begin trial of Breo inhaler to see if this makes any difference in her current symptoms.  She will go for x-ray  Obesity-continue efforts to lose weight through healthy diet and exercise and possible medication addition.  Hidradenitis-no recent complaint  VSD-she saw cardiology back in 2018 had echocardiogram in a felt like she was stable and had no specific concerns at that time.  Echocardiogram from February 2018, Dr. Einar Gip, showed normal LV size, small membranous VSD with left-to-right shunt, normal filling pattern, EF 66%, trace mitral regurgitation, trace tricuspid regurgitation, trace pulmonic regurgitation.  No loud murmur on exam today.  Consider echocardiogram within the next year   [place] was seen today for annual exam.  Diagnoses and all orders for this  visit:  Encounter for health maintenance examination in adult -     Microalbumin/Creatinine Ratio, Urine -     TSH -     Iron  Impaired fasting glucose  Hyperlipidemia, unspecified hyperlipidemia type  Dyspnea, unspecified type -     TSH -     Iron -     Spirometry with graph -     DG Chest 2 View; Future  Screening for thyroid disorder -     TSH  Screening for deficiency anemia -     Iron  Vaccine refused by patient  Vaccine counseling  Prediabetes  Obesity (BMI 30-39.9)  Hidradenitis  Acanthosis  VSD (ventricular septal defect)  Abnormal PFT  Other orders -     fluticasone furoate-vilanterol (BREO ELLIPTA) 100-25 MCG/INH AEPB; Inhale 1 puff into the lungs daily.    Follow-up pending labs, yearly for physical

## 2020-06-10 NOTE — Telephone Encounter (Signed)
Mickel Baas, I wanted to see if you had any information on medicines we have tried to get covered by insurance.  I would like her to be on something like Rybelsus, Ozempic, Trulicity, Wegovy, Saxenda or possibly Jardiance as she is on the very border of diabetes, hemoglobin A1c 6.5%, cannot tolerate Metformin, has insulin resistance, and really need to be on something to help control sugars and weight

## 2020-06-11 LAB — IRON: Iron: 48 ug/dL (ref 27–159)

## 2020-06-11 LAB — MICROALBUMIN / CREATININE URINE RATIO
Creatinine, Urine: 96.7 mg/dL
Microalb/Creat Ratio: 7 mg/g creat (ref 0–29)
Microalbumin, Urine: 6.8 ug/mL

## 2020-06-11 LAB — TSH: TSH: 3.13 u[IU]/mL (ref 0.450–4.500)

## 2020-06-17 ENCOUNTER — Telehealth: Payer: Self-pay | Admitting: Medical

## 2020-06-17 NOTE — Telephone Encounter (Signed)
Requested records received from [facility] 

## 2020-06-18 ENCOUNTER — Other Ambulatory Visit: Payer: Self-pay | Admitting: Medical

## 2020-06-18 MED ORDER — RYBELSUS 7 MG PO TABS: 1.0000 | ORAL_TABLET | Freq: Every day | ORAL | 0 refills | Status: DC

## 2020-06-18 NOTE — Telephone Encounter (Signed)
Status update on this message?

## 2020-06-18 NOTE — Telephone Encounter (Signed)
The reps usually say given 3mg  samples for 1-2 weeks and send for 90 day supply on the 7mg  which is what I sent.

## 2020-06-18 NOTE — Telephone Encounter (Signed)
Since pt has tried and failed Metformin, please send in Rybelsus and we will see if I can get it to go thru, since that is your first preference

## 2020-06-18 NOTE — Telephone Encounter (Signed)
We didnt have a current insurance card.  I called pt for insurance card and she emailed me.  I called [employer] t# [phone] Kirke Shaggy and Mancel Parsons are plan exclusions.  Rybelsus & Trulicity are only covered for diabetes after step therapy of Metformin.   Jardiance also requires step therapy of Metformin and also trial of other generic any sartans, prils, or Glipizide.

## 2020-06-18 NOTE — Telephone Encounter (Signed)
Okay, please let her know this.  If agreeable we can try something like Jardiance.  She has already failed Metformin.  I would prefer something like Rybelsus or Trulicity but due to the step therapy we will have to try Jardiance or possibly glipizide.  If it comes down to it, we could do 2 weeks of glipizide and if she is not tolerating this then we can try again for 1 of these others.  To help with weight loss and sugar control I would want to use either Rybelsus or Trulicity or Jardiance, the preference being Rybelsus

## 2020-06-22 ENCOUNTER — Other Ambulatory Visit: Payer: Self-pay

## 2020-06-22 DIAGNOSIS — E669 Obesity, unspecified: Secondary | ICD-10-CM

## 2020-06-22 DIAGNOSIS — R7301 Impaired fasting glucose: Secondary | ICD-10-CM

## 2020-06-22 NOTE — Telephone Encounter (Signed)
Felizardo Hoffmann please send referral Thanks

## 2020-06-22 NOTE — Telephone Encounter (Signed)
Called pharmacy and Rybelsus went thru [facility] but pt has high deductable and cost is $2400 for 90 days.  Printed disocunt card and called pharmacy back and dropped to $1544 for 90 days.  Called pt and she can't afford that.  She is unable to take Phentermine so I don't know of any other affordable options at this time.  I asked if she would be willing to go to [facility] and she said yes.  Do you want to try that route?

## 2020-06-22 NOTE — Telephone Encounter (Signed)
Referral has been sent.

## 2020-06-22 NOTE — Telephone Encounter (Signed)
Yes, lets refer to [facility] (sent message to [place])

## 2020-07-16 ENCOUNTER — Encounter: Payer: Self-pay | Admitting: Medical

## 2020-09-01 ENCOUNTER — Encounter (INDEPENDENT_AMBULATORY_CARE_PROVIDER_SITE_OTHER): Payer: Self-pay

## 2020-09-02 ENCOUNTER — Ambulatory Visit (INDEPENDENT_AMBULATORY_CARE_PROVIDER_SITE_OTHER): Payer: Commercial Managed Care - PPO | Admitting: Family Medicine

## 2020-09-16 ENCOUNTER — Ambulatory Visit (INDEPENDENT_AMBULATORY_CARE_PROVIDER_SITE_OTHER): Payer: Commercial Managed Care - PPO | Admitting: Family Medicine

## 2020-09-16 ENCOUNTER — Telehealth: Payer: Self-pay | Admitting: Medical

## 2020-09-16 NOTE — Telephone Encounter (Signed)
Dismissal letter in guarantor snapshot  °

## 2020-12-16 ENCOUNTER — Encounter: Payer: Self-pay | Admitting: Internal Medicine

## 2021-02-25 ENCOUNTER — Other Ambulatory Visit: Payer: Self-pay

## 2021-02-25 ENCOUNTER — Inpatient Hospital Stay (HOSPITAL_COMMUNITY): Payer: Commercial Managed Care - PPO | Admitting: Anesthesiology

## 2021-02-25 ENCOUNTER — Encounter (HOSPITAL_COMMUNITY): Payer: Self-pay | Admitting: Obstetrics and Gynecology

## 2021-02-25 ENCOUNTER — Inpatient Hospital Stay (HOSPITAL_COMMUNITY)
Admission: AD | Admit: 2021-02-25 | Discharge: 2021-02-28 | DRG: 786 | Disposition: A | Discharge status: Home / Self Care | Payer: Commercial Managed Care - PPO | Attending: Obstetrics and Gynecology | Admitting: Obstetrics and Gynecology

## 2021-02-25 DIAGNOSIS — O99214 Obesity complicating childbirth: Secondary | ICD-10-CM | POA: Diagnosis present

## 2021-02-25 DIAGNOSIS — Z3A40 40 weeks gestation of pregnancy: Secondary | ICD-10-CM

## 2021-02-25 DIAGNOSIS — R030 Elevated blood-pressure reading, without diagnosis of hypertension: Secondary | ICD-10-CM | POA: Diagnosis not present

## 2021-02-25 DIAGNOSIS — O368130 Decreased fetal movements, third trimester, not applicable or unspecified: Principal | ICD-10-CM | POA: Diagnosis present

## 2021-02-25 DIAGNOSIS — E119 Type 2 diabetes mellitus without complications: Secondary | ICD-10-CM | POA: Diagnosis present

## 2021-02-25 DIAGNOSIS — O9902 Anemia complicating childbirth: Secondary | ICD-10-CM | POA: Diagnosis present

## 2021-02-25 DIAGNOSIS — Z20822 Contact with and (suspected) exposure to covid-19: Secondary | ICD-10-CM | POA: Diagnosis present

## 2021-02-25 DIAGNOSIS — O2412 Pre-existing diabetes mellitus, type 2, in childbirth: Secondary | ICD-10-CM | POA: Diagnosis present

## 2021-02-25 DIAGNOSIS — D573 Sickle-cell trait: Secondary | ICD-10-CM | POA: Diagnosis present

## 2021-02-25 DIAGNOSIS — O26893 Other specified pregnancy related conditions, third trimester: Secondary | ICD-10-CM | POA: Diagnosis not present

## 2021-02-25 HISTORY — DX: Cardiac murmur, unspecified: R01.1

## 2021-02-25 LAB — COMPREHENSIVE METABOLIC PANEL
ALT: 12 U/L (ref 0–44)
AST: 19 U/L (ref 15–41)
Albumin: 2.8 g/dL — ABNORMAL LOW (ref 3.5–5.0)
Alkaline Phosphatase: 79 U/L (ref 38–126)
Anion gap: 6 (ref 5–15)
BUN: <5 mg/dL — ABNORMAL LOW (ref 6–20)
CO2: 22 mmol/L (ref 22–32)
Calcium: 9.1 mg/dL (ref 8.9–10.3)
Chloride: 106 mmol/L (ref 98–111)
Creatinine, Ser: 0.73 mg/dL (ref 0.44–1.00)
GFR, Estimated: >60 mL/min (ref >60)
Glucose, Bld: 79 mg/dL (ref 70–99)
Potassium: 4.1 mmol/L (ref 3.5–5.1)
Sodium: 134 mmol/L — ABNORMAL LOW (ref 135–145)
Total Bilirubin: 0.6 mg/dL (ref 0.3–1.2)
Total Protein: 7.1 g/dL (ref 6.5–8.1)

## 2021-02-25 LAB — CBC
HCT: 35.5 % — ABNORMAL LOW (ref 36.0–46.0)
Hemoglobin: 11.8 g/dL — ABNORMAL LOW (ref 12.0–15.0)
MCH: 27 pg (ref 26.0–34.0)
MCHC: 33.2 g/dL (ref 30.0–36.0)
MCV: 81.2 fL (ref 80.0–100.0)
Platelets: 274 10*3/uL (ref 150–400)
RBC: 4.37 MIL/uL (ref 3.87–5.11)
RDW: 13.7 % (ref 11.5–15.5)
WBC: 8.2 10*3/uL (ref 4.0–10.5)
nRBC: 0 % (ref 0.0–0.2)

## 2021-02-25 LAB — TYPE AND SCREEN
ABO/RH(D): A POS
Antibody Screen: NEGATIVE

## 2021-02-25 LAB — GLUCOSE, CAPILLARY
Glucose-Capillary: 67 mg/dL — ABNORMAL LOW (ref 70–99)
Glucose-Capillary: 116 mg/dL — ABNORMAL HIGH (ref 70–99)

## 2021-02-25 LAB — RESP PANEL BY RT-PCR (FLU A&B, COVID) ARPGX2
Influenza A by PCR: NEGATIVE
Influenza B by PCR: NEGATIVE
SARS Coronavirus 2 by RT PCR: NEGATIVE

## 2021-02-25 LAB — PROTEIN / CREATININE RATIO, URINE
Creatinine, Urine: 31.93 mg/dL
Total Protein, Urine: <6 mg/dL

## 2021-02-25 MED ORDER — EPHEDRINE 5 MG/ML INJ
10.0000 mg | INTRAVENOUS | Status: DC | PRN
  Administered 2021-02-25: 10 mg via INTRAVENOUS
  Filled 2021-02-25: qty 5

## 2021-02-25 MED ORDER — PHENYLEPHRINE 40 MCG/ML (10ML) SYRINGE FOR IV PUSH (FOR BLOOD PRESSURE SUPPORT): 80.0000 ug | PREFILLED_SYRINGE | INTRAVENOUS | Status: DC | PRN

## 2021-02-25 MED ORDER — OXYCODONE-ACETAMINOPHEN 5-325 MG PO TABS: 1.0000 | ORAL_TABLET | ORAL | Status: DC | PRN

## 2021-02-25 MED ORDER — TERBUTALINE SULFATE 1 MG/ML IJ SOLN: 0.2500 mg | Freq: Once | INTRAMUSCULAR | Status: DC | PRN

## 2021-02-25 MED ORDER — LACTATED RINGERS IV SOLN: 500.0000 mL | Freq: Once | INTRAVENOUS | Status: DC

## 2021-02-25 MED ORDER — EPHEDRINE 5 MG/ML INJ: 10.0000 mg | INTRAVENOUS | Status: DC | PRN

## 2021-02-25 MED ORDER — OXYCODONE-ACETAMINOPHEN 5-325 MG PO TABS: 2.0000 | ORAL_TABLET | ORAL | Status: DC | PRN

## 2021-02-25 MED ORDER — LACTATED RINGERS IV SOLN: INTRAVENOUS | Status: DC

## 2021-02-25 MED ORDER — OXYTOCIN-SODIUM CHLORIDE 30-0.9 UT/500ML-% IV SOLN
1.0000 m[IU]/min | INTRAVENOUS | Status: DC
  Administered 2021-02-25: 2 m[IU]/min via INTRAVENOUS
  Filled 2021-02-25: qty 500

## 2021-02-25 MED ORDER — INSULIN ASPART 100 UNIT/ML IJ SOLN: 0.0000 [IU] | INTRAMUSCULAR | Status: DC

## 2021-02-25 MED ORDER — DIPHENHYDRAMINE HCL 50 MG/ML IJ SOLN: 12.5000 mg | INTRAMUSCULAR | Status: DC | PRN

## 2021-02-25 MED ORDER — OXYTOCIN-SODIUM CHLORIDE 30-0.9 UT/500ML-% IV SOLN: 2.5000 [IU]/h | INTRAVENOUS | Status: DC

## 2021-02-25 MED ORDER — FENTANYL CITRATE (PF) 100 MCG/2ML IJ SOLN: 50.0000 ug | INTRAMUSCULAR | Status: DC | PRN

## 2021-02-25 MED ORDER — LIDOCAINE HCL (PF) 1 % IJ SOLN: 30.0000 mL | INTRAMUSCULAR | Status: DC | PRN

## 2021-02-25 MED ORDER — ONDANSETRON HCL 4 MG/2ML IJ SOLN: 4.0000 mg | Freq: Four times a day (QID) | INTRAMUSCULAR | Status: DC | PRN

## 2021-02-25 MED ORDER — ACETAMINOPHEN 325 MG PO TABS: 650.0000 mg | ORAL_TABLET | ORAL | Status: DC | PRN

## 2021-02-25 MED ORDER — FENTANYL-BUPIVACAINE-NACL 0.5-0.125-0.9 MG/250ML-% EP SOLN
12.0000 mL/h | EPIDURAL | Status: DC | PRN
  Administered 2021-02-25: 12 mL/h via EPIDURAL
  Filled 2021-02-25: qty 250

## 2021-02-25 MED ORDER — SOD CITRATE-CITRIC ACID 500-334 MG/5ML PO SOLN
30.0000 mL | ORAL | Status: DC | PRN
  Filled 2021-02-25: qty 30

## 2021-02-25 MED ORDER — LACTATED RINGERS IV SOLN: 500.0000 mL | INTRAVENOUS | Status: DC | PRN

## 2021-02-25 MED ORDER — LIDOCAINE HCL (PF) 1 % IJ SOLN
INTRAMUSCULAR | Status: DC | PRN
  Administered 2021-02-25: 6 mL via EPIDURAL

## 2021-02-25 MED ORDER — OXYTOCIN BOLUS FROM INFUSION: 333.0000 mL | Freq: Once | INTRAVENOUS | Status: DC

## 2021-02-25 NOTE — MAU Provider Note (Signed)
History     CSN: [id number]  Arrival date and time: 02/25/21 1707   Event Date/Time   First Provider Initiated Contact with Patient 02/25/21 1824      Chief Complaint  Patient presents with   Decreased Fetal Movement   HPI Lynn Tucker is a 30 y.o. G4P1021 at [redacted]w[redacted]d who presents with decreased fetal movement. Reports she has only felt the baby move once today. Since arriving to MAU she reports some increase in movement but still less than normal.  Denies history of hypertension. Denies headache, visual disturbance, or epigastric pain.  Has pre gestational type 2 diabetes & has been monitoring her blood sugars at home. Is not on medication for this. States she hasn't checked her blood sugar today but checked a fasting yesterday morning that was in the 80's. Reports irregular contractions. Denies LOF or vaginal bleeding.   OB History     Gravida  4   Para  1   Term  1   Preterm  0   AB  2   Living  1      SAB  2   IAB  0   Ectopic  0   Multiple  0   Live Births  1        Obstetric Comments  D&E for incomplete abortion         Past Medical History:  Diagnosis Date   ADHD (attention deficit hyperactivity disorder)    Chlamydia 11/11/2015   H. pylori infection 04/2014   off antibiotics at this time   Heart murmur    Hyperlipidemia    Impaired fasting glucose    2013   Molar pregnancy 04/25/2017   Obesity    Short cervix during pregnancy in second trimester 09/21/2018   Sickle cell trait ([facility])    SVD (spontaneous vaginal delivery) 01/31/2019   VSD (ventricular septal defect)    followed by cardiology every 2 years   Wears glasses     Past Surgical History:  Procedure Laterality Date   CERVICAL CERCLAGE N/A 09/22/2018   Procedure: CERCLAGE CERVICAL;  Surgeon: Janyth Contes, MD;  Location: [facility];  Service: Gynecology;  Laterality: N/A;   DILATION AND EVACUATION N/A 08/05/2015   Procedure: DILATATION AND EVACUATION;  Surgeon:  Marylynn Pearson, MD;  Location: [facility];  Service: Gynecology;  Laterality: N/A;   DILATION AND EVACUATION N/A 04/25/2017   Procedure: DILATATION AND EVACUATION;  Surgeon: Janyth Contes, MD;  Location: [facility];  Service: Gynecology;  Laterality: N/A;   WISDOM TOOTH EXTRACTION      Family History  Problem Relation Age of Onset   Hypertension Mother    Sickle cell trait Father    Hypertension Father    Cancer Other        maternal side-? type   Hyperlipidemia Other        ? side   Stroke Other        paternal side   Diabetes Other        paternal and maternal side   Thyroid disease Paternal Aunt     Social History   Tobacco Use   Smoking status: Never   Smokeless tobacco: Never  Vaping Use   Vaping Use: Never used  Substance Use Topics   Alcohol use: No    Alcohol/week: 1.0 standard drink    Types: 1 Standard drinks or equivalent per week   Drug use: No    Allergies:  Allergies  Allergen Reactions  Metformin And Related     Causes oily stool & cramps   Phentermine     Anxiety, dry mouth, globus sensation    Medications Prior to Admission  Medication Sig Dispense Refill Last Dose   Prenatal Vit-Fe Fumarate-FA (PRENATAL MULTIVITAMIN) TABS tablet Take 1 tablet by mouth daily at 12 noon.   02/24/2021   albuterol (VENTOLIN HFA) 108 (90 Base) MCG/ACT inhaler INHALE 2 PUFFS INTO THE LUNGS EVERY 6 HOURS AS NEEDED FOR WHEEZING OR SHORTNESS OF BREATH (Patient not taking: Reported on 06/10/2020) 8.5 g 0    fluticasone furoate-vilanterol (BREO ELLIPTA) 100-25 MCG/INH AEPB Inhale 1 puff into the lungs daily. 1 each 0    phentermine (ADIPEX-P) 37.5 MG tablet Take 1 tablet (37.5 mg total) by mouth daily before breakfast. (Patient not taking: Reported on 06/10/2020) 30 tablet 1    Semaglutide (RYBELSUS) 7 MG TABS Take 1 tablet by mouth daily. 90 tablet 0     Review of Systems  Constitutional: Negative.   Eyes:  Negative for visual disturbance.  Gastrointestinal: Negative.    Genitourinary: Negative.   Neurological:  Negative for headaches.  Physical Exam   Blood pressure 126/70, pulse 82, temperature 98.8 F (37.1 C), temperature source Oral, resp. rate 15, SpO2 100 %.  Temp:  [98.8 F (37.1 C)] 98.8 F (37.1 C) (11/18 1739) Pulse Rate:  [75-90] 83 (11/18 1916) Resp:  [15] 15 (11/18 1739) BP: (114-143)/(65-82) 114/75 (11/18 1916) SpO2:  [100 %] 100 % (11/18 1739)   Physical Exam Vitals and nursing note reviewed.  Constitutional:      Appearance: Normal appearance. She is not ill-appearing.  HENT:     Head: Normocephalic and atraumatic.  Eyes:     General: No scleral icterus. Pulmonary:     Effort: Pulmonary effort is normal. No respiratory distress.  Abdominal:     Palpations: Abdomen is soft.     Tenderness: There is no abdominal tenderness.  Skin:    General: Skin is warm and dry.  Neurological:     General: No focal deficit present.     Mental Status: She is alert.  Psychiatric:        Mood and Affect: Mood normal.        Behavior: Behavior normal.   NST:  Baseline: 145 bpm, Variability: Good {> 6 bpm), Accelerations: Reactive, and Decelerations: Absent   Limited bedside ultrasound Pt informed that the ultrasound is considered a limited OB ultrasound and is not intended to be a complete ultrasound exam.  Patient also informed that the ultrasound is not being completed with the intent of assessing for fetal or placental anomalies or any pelvic abnormalities.  Explained that the purpose of today's ultrasound is to assess for  presentation.  Patient acknowledges the purpose of the exam and the limitations of the study.  cephalic  MAU Course  Procedures Results for orders placed or performed during the hospital encounter of 02/25/21 (from the past 24 hour(s))  CBC     Status: Abnormal   Collection Time: 02/25/21  6:20 PM  Result Value Ref Range   WBC 8.2 4.0 - 10.5 K/uL   RBC 4.37 3.87 - 5.11 MIL/uL   Hemoglobin 11.8 (L) 12.0 - 15.0  g/dL   HCT 35.5 (L) 36.0 - 46.0 %   MCV 81.2 80.0 - 100.0 fL   MCH 27.0 26.0 - 34.0 pg   MCHC 33.2 30.0 - 36.0 g/dL   RDW 13.7 11.5 - 15.5 %   Platelets 274 150 -  400 K/uL   nRBC 0.0 0.0 - 0.2 %    MDM Patient presents with decreased fetal movement at 40 weeks. She has a reactive fetal tracing. She had 1 elevated BP; rest are normal & no symptoms- preeclampsia labs have been ordered. She does have a history of preexisting type 2 DM.   Cervix checked by RN, unchanged from office but unable to determine presenting part. BSUS performed & determined cephalic presentation.   Dr. Brien Mates notified of patient's presentation, vital signs, history, and exam. Will admit to birthing suites.   Assessment and Plan   1. Decreased fetal movements in third trimester, single or unspecified fetus   2. Elevated BP without diagnosis of hypertension   3. Type 2 diabetes mellitus without complication, without long-term current use of insulin (HCC)   4. [redacted] weeks gestation of pregnancy    -Dr. Brien Mates to admit patient  Jorje Guild 02/25/2021, 6:24 PM

## 2021-02-25 NOTE — Anesthesia Procedure Notes (Signed)
Epidural Patient location during procedure: OB Start time: 02/25/2021 10:42 PM End time: 02/25/2021 10:52 PM  Staffing Anesthesiologist: Merlinda Frederick, MD Performed: anesthesiologist   Preanesthetic Checklist Completed: patient identified, IV checked, site marked, risks and benefits discussed, monitors and equipment checked, pre-op evaluation and timeout performed  Epidural Patient position: sitting Prep: DuraPrep Patient monitoring: heart rate, cardiac monitor, continuous pulse ox and blood pressure Approach: midline Location: L3-L4 Injection technique: LOR saline  Needle:  Needle type: Tuohy  Needle gauge: 17 G Needle length: 9 cm Needle insertion depth: 7 cm Catheter type: closed end flexible Catheter size: 20 Guage Catheter at skin depth: 12 cm Test dose: negative and Other  Assessment Events: blood not aspirated, injection not painful, no injection resistance and negative IV test  Additional Notes Informed consent obtained prior to proceeding including risk of failure, 1% risk of PDPH, risk of minor discomfort and bruising.  Discussed rare but serious complications including epidural abscess, permanent nerve injury, epidural hematoma.  Discussed alternatives to epidural analgesia and patient desires to proceed.  Timeout performed pre-procedure verifying patient name, procedure, and platelet count.  Patient tolerated procedure well.  1st attempt + heme in catheter, catheter removed and flushed. 2nd attempt no heme, no csf. Catheter threaded easily. Norton Blizzard, MD

## 2021-02-25 NOTE — Anesthesia Preprocedure Evaluation (Addendum)
Anesthesia Evaluation  Patient identified by MRN, date of birth, ID band Patient awake    Reviewed: Allergy & Precautions, H&P , Patient's Chart, lab work & pertinent test results  Airway Mallampati: III  TM Distance: >3 FB Neck ROM: Full    Dental no notable dental hx.    Pulmonary neg pulmonary ROS,    Pulmonary exam normal breath sounds clear to auscultation       Cardiovascular negative cardio ROS Normal cardiovascular exam Rhythm:Regular Rate:Normal     Neuro/Psych negative neurological ROS     GI/Hepatic negative GI ROS, Neg liver ROS,   Endo/Other  Obesity prediabetes  Renal/GU negative Renal ROS  negative genitourinary   Musculoskeletal negative musculoskeletal ROS (+)   Abdominal   Peds  (+) Congenital Heart DiseaseH/o VSD   Hematology  (+) Sickle cell trait ,   Anesthesia Other Findings   Reproductive/Obstetrics negative OB ROS                             Anesthesia Physical Anesthesia Plan  ASA: 3  Anesthesia Plan: Epidural   Post-op Pain Management:    Induction:   PONV Risk Score and Plan: 2 and Treatment may vary due to age or medical condition  Airway Management Planned: Natural Airway  Additional Equipment: None  Intra-op Plan:   Post-operative Plan:   Informed Consent: I have reviewed the patients History and Physical, chart, labs and discussed the procedure including the risks, benefits and alternatives for the proposed anesthesia with the patient or authorized representative who has indicated his/her understanding and acceptance.       Plan Discussed with: Anesthesiologist and CRNA  Anesthesia Plan Comments: (C section called due to fetal intolerance to labor. Plan to use existing epidural and dose for surgical anesthesia. GETA/RSI as backup plan. Norton Blizzard, MD  )       Anesthesia Quick Evaluation

## 2021-02-25 NOTE — H&P (Signed)
Lynn Tucker is a 30 y.o. female [id number] [redacted]w[redacted]d presented to MAU this evening for decreased fetal movement all day. She denies contractions, LOF, VB. States she passed her mucus plug today.   In MAU, initial Bps 132/74, 143/82, followed by normotensive blood pressures.  Denies headache, vision changes, RUQ pain  Pregnancy c/b: Prediabetes vs. pregestational Type 2 DM: on chart review, patient's Hb A1C with her initial prenatal labs was 5.9, however noted that her HbA1c in January was 6.5. She has been monitoring her blood sugars throughout pregnancy and have been mostly normal without any medication. She had gestational diabetes in previous pregnancy. Fetal echo normal per prenatal record. Most recent EFW 33% near 32 weeks.  History of cervical insufficiency with rescue cerclage in last pregnancy at 18 weeks, delivered at 37 weeks. Cervical lengths were monitored in this pregnancy and were within normal limits History of molar pregnancy in 2019, s/p D&E. Sickle cell trait: FOB AA Obesity: early pregnancy BMI 33  OB History     Gravida  4   Para  1   Term  1   Preterm  0   AB  2   Living  1      SAB  2   IAB  0   Ectopic  0   Multiple  0   Live Births  1        Obstetric Comments  D&E for incomplete abortion        Past Medical History:  Diagnosis Date   ADHD (attention deficit hyperactivity disorder)    Chlamydia 11/11/2015   H. pylori infection 04/2014   off antibiotics at this time   Heart murmur    Hyperlipidemia    Impaired fasting glucose    2013   Molar pregnancy 04/25/2017   Obesity    Short cervix during pregnancy in second trimester 09/21/2018   Sickle cell trait (HCC)    SVD (spontaneous vaginal delivery) 01/31/2019   VSD (ventricular septal defect)    followed by cardiology every 2 years   Wears glasses    Past Surgical History:  Procedure Laterality Date   CERVICAL CERCLAGE N/A 09/22/2018   Procedure: CERCLAGE CERVICAL;  Surgeon:  Janyth Contes, MD;  Location: [facility];  Service: Gynecology;  Laterality: N/A;   DILATION AND EVACUATION N/A 08/05/2015   Procedure: DILATATION AND EVACUATION;  Surgeon: Marylynn Pearson, MD;  Location: [facility];  Service: Gynecology;  Laterality: N/A;   DILATION AND EVACUATION N/A 04/25/2017   Procedure: DILATATION AND EVACUATION;  Surgeon: Janyth Contes, MD;  Location: [facility];  Service: Gynecology;  Laterality: N/A;   WISDOM TOOTH EXTRACTION     Family History: family history includes Cancer in an other family member; Diabetes in an other family member; Hyperlipidemia in an other family member; Hypertension in her father and mother; Sickle cell trait in her father; Stroke in an other family member; Thyroid disease in her paternal aunt. Social History:  reports that she has never smoked. She has never used smokeless tobacco. She reports that she does not drink alcohol and does not use drugs.     Maternal Diabetes: Yes:  Diabetes Type:  Pre-pregnancy Genetic Screening: Normal Maternal Ultrasounds/Referrals: Normal Fetal Ultrasounds or other Referrals:  Fetal echo Maternal Substance Abuse:  No Significant Maternal Medications:  Meds include: Other:  Significant Maternal Lab Results:  Group B Strep negative Other Comments:  None  Review of Systems Per HPI Exam Physical Exam  Dilation: 4 Effacement (%): 70  Station: -3 Exam by:: Brien Mates, MD Blood pressure 123/68, pulse 85, temperature 98.8 F (37.1 C), temperature source Oral, resp. rate 16, SpO2 100 %. Gen: NAD, resting comfortably CVS: Normal pulses Lungs: nonlabored respirations Abd: Gravid abdomen Ext: no calf edema or tenderness Vertex per MAU US  Fetal testing: 140bpm, moderate variability, + accels, no decels Toco: ctx q 2-3 mins Prenatal labs: ABO, Rh:  --/--/A POS (11/18 1844) Antibody: NEG (11/18 1844) Rubella:   RPR:    HBsAg:    HIV:    GBS:      Assessment/Plan: 29Y [id number] @ [redacted]w[redacted]d, with  decreased fetal movement today. NST in MAU is reactive, however in setting of suspected pregestational diabetes, I recommend admission for IOL. Patient is happy to stay for induction.  Fetal wellbeing: cat I tracing IOL: pitocin, plan AROM when fetal station lower Suspected pregestational diabetes: monitor blood sugars with insulin sliding scale per protocol Elevated blood pressures in MAU: now normotensive, asymptomatic, labs pending Pain control: epidural upon patient request  Rowland Lathe 02/25/2021, 9:45 PM

## 2021-02-25 NOTE — MAU Note (Signed)
.  Lynn Tucker is a 30 y.o. at [redacted]w[redacted]d here in MAU reporting: called the office because she has had DFM today. Has only felt the baby move x1 in the last 30 minutes. Denies VB or LOF. States she passed her mucus plug and states it was "foul smelling"  FHT:135

## 2021-02-26 ENCOUNTER — Encounter (HOSPITAL_COMMUNITY): Payer: Self-pay | Admitting: Obstetrics and Gynecology

## 2021-02-26 ENCOUNTER — Encounter (HOSPITAL_COMMUNITY)
Admission: AD | Disposition: A | Discharge status: Home / Self Care | Payer: Self-pay | Source: Home / Self Care | Attending: Obstetrics and Gynecology

## 2021-02-26 LAB — GLUCOSE, CAPILLARY
Glucose-Capillary: 136 mg/dL — ABNORMAL HIGH (ref 70–99)
Glucose-Capillary: 179 mg/dL — ABNORMAL HIGH (ref 70–99)
Glucose-Capillary: 77 mg/dL (ref 70–99)
Glucose-Capillary: 80 mg/dL (ref 70–99)
Glucose-Capillary: 86 mg/dL (ref 70–99)
Glucose-Capillary: 89 mg/dL (ref 70–99)

## 2021-02-26 LAB — RPR: RPR Ser Ql: NONREACTIVE

## 2021-02-26 SURGERY — Surgical Case
Anesthesia: Epidural | Wound class: Clean Contaminated

## 2021-02-26 MED ORDER — OXYCODONE HCL 5 MG/5ML PO SOLN: 5.0000 mg | Freq: Once | ORAL | Status: DC | PRN

## 2021-02-26 MED ORDER — ONDANSETRON HCL 4 MG/2ML IJ SOLN: 4.0000 mg | Freq: Three times a day (TID) | INTRAMUSCULAR | Status: DC | PRN

## 2021-02-26 MED ORDER — DIPHENHYDRAMINE HCL 50 MG/ML IJ SOLN: 12.5000 mg | INTRAMUSCULAR | Status: DC | PRN

## 2021-02-26 MED ORDER — SODIUM CHLORIDE 0.9 % IV SOLN
500.0000 mg | INTRAVENOUS | Status: AC
  Administered 2021-02-26: 500 mg via INTRAVENOUS

## 2021-02-26 MED ORDER — ACETAMINOPHEN 500 MG PO TABS
1000.0000 mg | ORAL_TABLET | Freq: Four times a day (QID) | ORAL | Status: AC
  Administered 2021-02-26 (×2): 1000 mg via ORAL
  Filled 2021-02-26 (×2): qty 2

## 2021-02-26 MED ORDER — NALBUPHINE HCL 10 MG/ML IJ SOLN: 5.0000 mg | Freq: Once | INTRAMUSCULAR | Status: DC | PRN

## 2021-02-26 MED ORDER — OXYTOCIN-SODIUM CHLORIDE 30-0.9 UT/500ML-% IV SOLN
INTRAVENOUS | Status: DC | PRN
  Administered 2021-02-26: 30 [IU] via INTRAVENOUS

## 2021-02-26 MED ORDER — SODIUM CHLORIDE 0.9 % IV SOLN
INTRAVENOUS | Status: AC
  Filled 2021-02-26: qty 500

## 2021-02-26 MED ORDER — KETOROLAC TROMETHAMINE 30 MG/ML IJ SOLN: 30.0000 mg | Freq: Four times a day (QID) | INTRAMUSCULAR | Status: AC | PRN

## 2021-02-26 MED ORDER — WITCH HAZEL-GLYCERIN EX PADS: 1.0000 "application " | MEDICATED_PAD | CUTANEOUS | Status: DC | PRN

## 2021-02-26 MED ORDER — NALOXONE HCL 0.4 MG/ML IJ SOLN: 0.4000 mg | INTRAMUSCULAR | Status: DC | PRN

## 2021-02-26 MED ORDER — OXYTOCIN-SODIUM CHLORIDE 30-0.9 UT/500ML-% IV SOLN: 2.5000 [IU]/h | INTRAVENOUS | Status: AC

## 2021-02-26 MED ORDER — PROMETHAZINE HCL 25 MG/ML IJ SOLN: 6.2500 mg | INTRAMUSCULAR | Status: DC | PRN

## 2021-02-26 MED ORDER — MEPERIDINE HCL 25 MG/ML IJ SOLN: 6.2500 mg | INTRAMUSCULAR | Status: DC | PRN

## 2021-02-26 MED ORDER — DIPHENHYDRAMINE HCL 25 MG PO CAPS: 25.0000 mg | ORAL_CAPSULE | Freq: Four times a day (QID) | ORAL | Status: DC | PRN

## 2021-02-26 MED ORDER — ACETAMINOPHEN 10 MG/ML IV SOLN: 1000.0000 mg | Freq: Once | INTRAVENOUS | Status: DC | PRN

## 2021-02-26 MED ORDER — NALBUPHINE HCL 10 MG/ML IJ SOLN
5.0000 mg | INTRAMUSCULAR | Status: DC | PRN
  Administered 2021-02-26: 5 mg via INTRAVENOUS
  Filled 2021-02-26: qty 1

## 2021-02-26 MED ORDER — IBUPROFEN 600 MG PO TABS
600.0000 mg | ORAL_TABLET | Freq: Four times a day (QID) | ORAL | Status: DC
  Administered 2021-02-27 – 2021-02-28 (×5): 600 mg via ORAL
  Filled 2021-02-26 (×5): qty 1

## 2021-02-26 MED ORDER — DEXAMETHASONE SODIUM PHOSPHATE 10 MG/ML IJ SOLN
INTRAMUSCULAR | Status: DC | PRN
  Administered 2021-02-26: 10 mg via INTRAVENOUS

## 2021-02-26 MED ORDER — SCOPOLAMINE 1 MG/3DAYS TD PT72
1.0000 | MEDICATED_PATCH | TRANSDERMAL | Status: DC
  Administered 2021-02-26: 1.5 mg via TRANSDERMAL
  Filled 2021-02-26: qty 1

## 2021-02-26 MED ORDER — FENTANYL CITRATE (PF) 100 MCG/2ML IJ SOLN: 25.0000 ug | INTRAMUSCULAR | Status: DC | PRN

## 2021-02-26 MED ORDER — LACTATED RINGERS IV SOLN: INTRAVENOUS | Status: DC

## 2021-02-26 MED ORDER — DROPERIDOL 2.5 MG/ML IJ SOLN: 0.6250 mg | Freq: Once | INTRAMUSCULAR | Status: DC | PRN

## 2021-02-26 MED ORDER — OXYTOCIN-SODIUM CHLORIDE 30-0.9 UT/500ML-% IV SOLN
INTRAVENOUS | Status: AC
  Filled 2021-02-26: qty 500

## 2021-02-26 MED ORDER — NALOXONE HCL 4 MG/10ML IJ SOLN
1.0000 ug/kg/h | INTRAVENOUS | Status: DC | PRN
  Filled 2021-02-26: qty 5

## 2021-02-26 MED ORDER — FLEET ENEMA 7-19 GM/118ML RE ENEM: 1.0000 | ENEMA | Freq: Every day | RECTAL | Status: DC | PRN

## 2021-02-26 MED ORDER — SODIUM CHLORIDE 0.9% FLUSH: 3.0000 mL | INTRAVENOUS | Status: DC | PRN

## 2021-02-26 MED ORDER — DIBUCAINE (PERIANAL) 1 % EX OINT: 1.0000 "application " | TOPICAL_OINTMENT | CUTANEOUS | Status: DC | PRN

## 2021-02-26 MED ORDER — KETOROLAC TROMETHAMINE 30 MG/ML IJ SOLN
30.0000 mg | Freq: Four times a day (QID) | INTRAMUSCULAR | Status: AC
  Administered 2021-02-26 – 2021-02-27 (×4): 30 mg via INTRAVENOUS
  Filled 2021-02-26 (×4): qty 1

## 2021-02-26 MED ORDER — MENTHOL 3 MG MT LOZG: 1.0000 | LOZENGE | OROMUCOSAL | Status: DC | PRN

## 2021-02-26 MED ORDER — OXYCODONE HCL 5 MG PO TABS: 5.0000 mg | ORAL_TABLET | ORAL | Status: DC | PRN

## 2021-02-26 MED ORDER — HYDROMORPHONE HCL 1 MG/ML IJ SOLN: 0.2000 mg | INTRAMUSCULAR | Status: DC | PRN

## 2021-02-26 MED ORDER — ZOLPIDEM TARTRATE 5 MG PO TABS: 5.0000 mg | ORAL_TABLET | Freq: Every evening | ORAL | Status: DC | PRN

## 2021-02-26 MED ORDER — SODIUM CHLORIDE 0.9 % IR SOLN
Status: DC | PRN
  Administered 2021-02-26: 1000 mL

## 2021-02-26 MED ORDER — PHENYLEPHRINE 40 MCG/ML (10ML) SYRINGE FOR IV PUSH (FOR BLOOD PRESSURE SUPPORT)
PREFILLED_SYRINGE | INTRAVENOUS | Status: DC | PRN
  Administered 2021-02-26: 40 ug via INTRAVENOUS

## 2021-02-26 MED ORDER — TETANUS-DIPHTH-ACELL PERTUSSIS 5-2.5-18.5 LF-MCG/0.5 IM SUSY: 0.5000 mL | PREFILLED_SYRINGE | Freq: Once | INTRAMUSCULAR | Status: DC

## 2021-02-26 MED ORDER — SIMETHICONE 80 MG PO CHEW: 80.0000 mg | CHEWABLE_TABLET | ORAL | Status: DC | PRN

## 2021-02-26 MED ORDER — NALBUPHINE HCL 10 MG/ML IJ SOLN: 5.0000 mg | INTRAMUSCULAR | Status: DC | PRN

## 2021-02-26 MED ORDER — ACETAMINOPHEN 10 MG/ML IV SOLN
INTRAVENOUS | Status: DC | PRN
  Administered 2021-02-26: 1000 mg via INTRAVENOUS

## 2021-02-26 MED ORDER — MORPHINE SULFATE (PF) 0.5 MG/ML IJ SOLN
INTRAMUSCULAR | Status: DC | PRN
  Administered 2021-02-26: 3 mg via EPIDURAL

## 2021-02-26 MED ORDER — DIPHENHYDRAMINE HCL 25 MG PO CAPS: 25.0000 mg | ORAL_CAPSULE | ORAL | Status: DC | PRN

## 2021-02-26 MED ORDER — SOD CITRATE-CITRIC ACID 500-334 MG/5ML PO SOLN
30.0000 mL | ORAL | Status: AC
  Administered 2021-02-26: 30 mL via ORAL

## 2021-02-26 MED ORDER — COCONUT OIL OIL
1.0000 "application " | TOPICAL_OIL | Status: DC | PRN
  Administered 2021-02-27: 1 via TOPICAL

## 2021-02-26 MED ORDER — MORPHINE SULFATE (PF) 0.5 MG/ML IJ SOLN
INTRAMUSCULAR | Status: AC
  Filled 2021-02-26: qty 10

## 2021-02-26 MED ORDER — SCOPOLAMINE 1 MG/3DAYS TD PT72: 1.0000 | MEDICATED_PATCH | Freq: Once | TRANSDERMAL | Status: DC

## 2021-02-26 MED ORDER — BISACODYL 10 MG RE SUPP: 10.0000 mg | Freq: Every day | RECTAL | Status: DC | PRN

## 2021-02-26 MED ORDER — SODIUM CHLORIDE 0.9 % IV SOLN
12.5000 mg | Freq: Once | INTRAVENOUS | Status: AC
  Administered 2021-02-26: 12.5 mg via INTRAVENOUS
  Filled 2021-02-26: qty 0.5

## 2021-02-26 MED ORDER — PROPOFOL 10 MG/ML IV BOLUS
INTRAVENOUS | Status: AC
  Filled 2021-02-26: qty 20

## 2021-02-26 MED ORDER — SENNOSIDES-DOCUSATE SODIUM 8.6-50 MG PO TABS
2.0000 | ORAL_TABLET | ORAL | Status: DC
  Administered 2021-02-27 – 2021-02-28 (×2): 2 via ORAL
  Filled 2021-02-26 (×2): qty 2

## 2021-02-26 MED ORDER — OXYCODONE HCL 5 MG PO TABS: 5.0000 mg | ORAL_TABLET | Freq: Once | ORAL | Status: DC | PRN

## 2021-02-26 MED ORDER — PRENATAL MULTIVITAMIN CH
1.0000 | ORAL_TABLET | Freq: Every day | ORAL | Status: DC
  Administered 2021-02-27 – 2021-02-28 (×2): 1 via ORAL
  Filled 2021-02-26 (×2): qty 1

## 2021-02-26 MED ORDER — LIDOCAINE-EPINEPHRINE (PF) 2 %-1:200000 IJ SOLN
INTRAMUSCULAR | Status: DC | PRN
  Administered 2021-02-26: 2 mL via EPIDURAL
  Administered 2021-02-26: 5 mL via EPIDURAL

## 2021-02-26 MED ORDER — ONDANSETRON HCL 4 MG/2ML IJ SOLN
INTRAMUSCULAR | Status: DC | PRN
  Administered 2021-02-26: 4 mg via INTRAVENOUS

## 2021-02-26 MED ORDER — SIMETHICONE 80 MG PO CHEW
80.0000 mg | CHEWABLE_TABLET | Freq: Three times a day (TID) | ORAL | Status: DC
  Administered 2021-02-27 – 2021-02-28 (×4): 80 mg via ORAL
  Filled 2021-02-26 (×4): qty 1

## 2021-02-26 MED ORDER — DEXAMETHASONE SODIUM PHOSPHATE 10 MG/ML IJ SOLN
INTRAMUSCULAR | Status: AC
  Filled 2021-02-26: qty 1

## 2021-02-26 MED ORDER — ACETAMINOPHEN 10 MG/ML IV SOLN
INTRAVENOUS | Status: AC
  Filled 2021-02-26: qty 100

## 2021-02-26 MED ORDER — CEFAZOLIN SODIUM-DEXTROSE 2-4 GM/100ML-% IV SOLN
2.0000 g | INTRAVENOUS | Status: AC
  Administered 2021-02-26: 2 g via INTRAVENOUS

## 2021-02-26 MED ORDER — ACETAMINOPHEN 500 MG PO TABS: 1000.0000 mg | ORAL_TABLET | Freq: Four times a day (QID) | ORAL | Status: DC

## 2021-02-26 SURGICAL SUPPLY — 31 items
APL SKNCLS STERI-STRIP NONHPOA (GAUZE/BANDAGES/DRESSINGS) ×1
BENZOIN TINCTURE PRP APPL 2/3 (GAUZE/BANDAGES/DRESSINGS) ×2 IMPLANT
CLAMP CORD UMBIL (MISCELLANEOUS) ×2 IMPLANT
CLOTH BEACON ORANGE TIMEOUT ST (SAFETY) ×2 IMPLANT
DRSG OPSITE POSTOP 4X10 (GAUZE/BANDAGES/DRESSINGS) ×2 IMPLANT
ELECT REM PT RETURN 9FT ADLT (ELECTROSURGICAL) ×2
ELECTRODE REM PT RTRN 9FT ADLT (ELECTROSURGICAL) ×1 IMPLANT
EXTRACTOR VACUUM BELL STYLE (SUCTIONS) ×1 IMPLANT
GLOVE SURG LTX SZ6 (GLOVE) ×2 IMPLANT
GLOVE SURG UNDER POLY LF SZ6.5 (GLOVE) ×2 IMPLANT
GOWN STRL REUS W/TWL LRG LVL3 (GOWN DISPOSABLE) ×4 IMPLANT
KIT ABG SYR 3ML LUER SLIP (SYRINGE) ×2 IMPLANT
NDL HYPO 25X5/8 SAFETYGLIDE (NEEDLE) ×1 IMPLANT
NEEDLE HYPO 25X5/8 SAFETYGLIDE (NEEDLE) ×2 IMPLANT
NS IRRIG 1000ML POUR BTL (IV SOLUTION) ×2 IMPLANT
PACK C SECTION WH (CUSTOM PROCEDURE TRAY) ×2 IMPLANT
PAD OB MATERNITY 4.3X12.25 (PERSONAL CARE ITEMS) ×2 IMPLANT
PENCIL SMOKE EVAC W/HOLSTER (ELECTROSURGICAL) ×2 IMPLANT
RTRCTR C-SECT PINK 25CM LRG (MISCELLANEOUS) ×2 IMPLANT
STRIP CLOSURE SKIN 1/2X4 (GAUZE/BANDAGES/DRESSINGS) ×2 IMPLANT
SUT MNCRL 0 VIOLET CTX 36 (SUTURE) ×2 IMPLANT
SUT MONOCRYL 0 CTX 36 (SUTURE) ×4
SUT VIC AB 0 CT1 36 (SUTURE) ×4 IMPLANT
SUT VIC AB 3-0 CT1 27 (SUTURE) ×2
SUT VIC AB 3-0 CT1 TAPERPNT 27 (SUTURE) ×1 IMPLANT
SUT VIC AB 3-0 SH 27 (SUTURE) ×2
SUT VIC AB 3-0 SH 27X BRD (SUTURE) IMPLANT
SUT VIC AB 4-0 KS 27 (SUTURE) ×2 IMPLANT
TOWEL OR 17X24 6PK STRL BLUE (TOWEL DISPOSABLE) ×2 IMPLANT
TRAY FOLEY W/BAG SLVR 14FR LF (SET/KITS/TRAYS/PACK) ×2 IMPLANT
WATER STERILE IRR 1000ML POUR (IV SOLUTION) ×2 IMPLANT

## 2021-02-26 NOTE — Progress Notes (Signed)
Subjective: Postpartum Day 0: Cesarean Delivery Patient reports nausea and had one episode of emesis.  Sitting up in bed actively eating a cracker.  Pain controlled  Objective: Vital signs in last 24 hours: Temp:  [98 F (36.7 C)-98.9 F (37.2 C)] 98.2 F (36.8 C) (11/19 0930) Pulse Rate:  [65-138] 75 (11/19 0930) Resp:  [9-29] 18 (11/19 0930) BP: (97-150)/(40-93) 107/61 (11/19 0930) SpO2:  [94 %-100 %] 99 % (11/19 0930) Weight:  [81.2 kg] 81.2 kg (11/19 0406)  Physical Exam:  General: alert and cooperative Lochia: appropriate Uterine Fundus: firm Incision: C/D/I   Recent Labs    02/25/21 1820  HGB 11.8*  HCT 35.5*    Assessment/Plan: Status post Cesarean section with some nausea.  Will apply scope patch and give phenergan x 1.   Lynn Tucker 02/26/2021, 1:10 PM

## 2021-02-26 NOTE — Op Note (Signed)
CESAREAN SECTION Procedure Note  Patient: Lynn Tucker is a 30 y.o. [id number] @ [redacted]w[redacted]d  Preoperative Diagnosis:  Intrauterine pregnancy at 40  weeks 1 day Fetal intolerance to labor Suspected pregestational type 2 diabetes  Postoperative Diagnosis: same, delivered  Procedure: Primary low transverse cesarean     Surgeon: Rowland Lathe , MD  Anesthesia: Epidural  anesthesia   Findings: Normal appearing uterus, fallopian tubes bilaterally, and ovaries bilaterally.  Viable female infant in vertex presentation delivered at 0610 with weight pending. Apgars 9 and 9.  Estimated Blood Loss:  516mL         Specimens: Placenta to pathology         Complications:  None         Disposition: PACU - hemodynamically stable.         Condition: stable    Description of Procedure: The patient was taken to the operating room where epidural anesthesia was rebolused and found to be adequate.  The patient was placed in the dorsal supine position.  Fetal heart tones were confirmed. Thromboguards were applied and cycling. A foley catheter was in place and draining. Ancef 2g and Azithromycin 500mg  were given for infection prophylaxis. The patient was subsequently prepped and draped in the normal sterile fashion.    A low transverse skin incision was made with a scalpel and carried down to the level of the fascia with the Bovie.  The fascia was incised in the midline with the scalpel and extended laterally with curved Mayo scissors.  Kocher clamps were applied to the inferior fascial edge and the fascia was dissected off the rectus muscle sharply using the Mayo scissors.  The Kocher clamps were transferred to the superior fascial edge and the underlying rectus muscle was dissected off with curved Mayo's scissors.  The rectus muscles then were separated in the midline.  The peritoneum was found free of adherent bowel and the peritoneal cavity was entered with Metzenbaum scissors.  The uterus was  identified and the alexis retractor was placed intraperitoneal.  A bladder flap was then created sharply with Metzenbaum scissors and separated from the lower uterine segment digitally.   A low transverse hysterotomy was then made with a scalpel.  The infant was found in the vertex presentation was delivered atraumatically and without difficulty with standard maneuvers. A loose nuchal cord was reduced.  After 60 seconds of delayed cord clamping the cord was clamped and cut and the infant was handed off to the pediatricians.  The placenta was delivered with gentle traction on umbilical cord and manual massage of the uterine fundus.  The uterus was cleared of all clot and debris.  The hysterotomy was then closed with 0 monocryl in a running locked fashion,  followed by 0 Monocryl in an imbricating fashion.  The hysterotomy was found to be hemostatic. The fascia was closed with a 0 Vicryl suture in a continuous running fashion.  The subcutaneous tissue was irrigated and rendered hemostatic with cautery.  The subcutaneous layer was subsequently closed with 3-0 Vicryl in a continuous running fashion.  The skin was closed with 4-0 vicryl  in a running subcuticular fashion.  Sponge, lap and needle counts were correct. Steri strips and a Honeycomb dressing were placed on the incision and a pressure dressing was applied  Rowland Lathe, MD 02/26/21  6:52 AM

## 2021-02-26 NOTE — Progress Notes (Signed)
OB Progress Note  Pitocin still off, initially fetal heart rate recovered for almost an hour, now she is starting to have spontaneous contractions with late decelerations. Position changed with resolution of decelerations for now.    O: Today's Vitals   02/26/21 0200 02/26/21 0315 02/26/21 0400 02/26/21 0406  BP: 120/60 108/74    Pulse: 95 85    Resp: 15     Temp:  98 F (36.7 C)    TempSrc:  Oral    SpO2:      Weight:    81.2 kg  Height:    5' (1.524 m)  PainSc:   0-No pain    Body mass index is 34.96 kg/m.   FHR: 140bpm, min variability, late decelerations  Toco: irregular contractions   Discussed situation with patient and her partner. Without regular contractions, pitocin, or active labor, baby is showing intolerance to contractions. Would recommend proceeding with primary cesarean for fetal intolerance to labor. Discussed risks/benefits, including risk of infection, bleeding, blood transfusion, hysterectomy, damage to surrounding organs. Patient states understanding and agrees to proceed.   OR notified. Ancef 2g + Azithromycin 500mg . NPO. Foley in place. Epidural in place.  Luther Redo, MD 02/26/21 4:39 AM

## 2021-02-26 NOTE — Progress Notes (Signed)
OB Progress Note  Into room for recurrent late decelerations, initially subtle with still moderate variability, just prior to my arrival into the room had attempted throne position which appeared to exacerbate late decelerations. She was placed back on her left side.   Previously, she spontaneously ruptured membranes at 2150. She subsequently received an epidural and required ephedrine due to late decelerations associated with low BP. Since then, she has had few runs of late decelerations that have resolved with position change/fluid bolus and 1 prolonged 4 min deceleration, all in setting of moderate variability.  O: Today's Vitals   02/26/21 0035 02/26/21 0100 02/26/21 0130 02/26/21 0200  BP: (!) 112/41 112/66 (!) 116/58 120/60  Pulse: 82 72 96 95  Resp: 14 14 16 15   Temp:      TempSrc:      SpO2:      PainSc:  0-No pain 0-No pain    There is no height or weight on file to calculate BMI.  SVE 4.5/70/-1  FHR: 150bpm, minimal variability, recurrent late decelerations now resolved s/p position change to left lateral and stopping pitocin Toco: ctx q 2-3 min   A/P: 29Y [id number] @ [redacted]w[redacted]d, IOL DFM   Fetal wellbeing:  cat II tracing, currently resolved after pitocin stopped IOL: s/p SROM. Will take short pitocin break (was at 6) to allow recovery and will start again at 46mu/min. Discussed situation with patient. She is not yet in active labor and baby is showing signs of intolerance to contractions. Discussed if late decelerations persist after restarting the pitocin, will likely need to move toward cesarean for fetal intolerance to labor.  Suspected pregestational diabetes: monitor blood sugars with insulin sliding scale per protocol Elevated blood pressures in MAU: now normotensive, asymptomatic, labs WNL Pain control: epidural in place   M. Brien Mates, MD  02/26/21 3:06 AM

## 2021-02-26 NOTE — Progress Notes (Signed)
Patient is having continued nausea and her next dose of zofran is not until 1400.  Dr. Marvel Plan was notified and she order phenergan 12.5mg  IV to be given to the patient. RN will monitor for improvement once medication is administered.  Lynn Tucker, [place]

## 2021-02-26 NOTE — Lactation Note (Signed)
This note was copied from a baby's chart. Lactation Consultation Note  Patient Name: Lynn Tucker [id number] Date: 02/26/2021 Reason for consult: Initial assessment;Maternal endocrine disorder;Term (low Blood sugar: baby) Age:29 hours  Mom is awake and baby is sleeping.  Mom states she fed recently for 15 minutes.    She declines assistance with BF at this time and wants to allow the baby to sleep.   LC reveiwed hand expression and drops were placed on gloved finger and then inside the baby's lips.  LC discussed importance of STS and feeding due to low sugar.  Mom will call out for assistance when infant cues.  Encouraged: STS, hand expression prior to latching and using pillows for support. Mom will call out when infant wakes to BF.  Maternal Data Has patient been taught Hand Expression?: Yes Does the patient have breastfeeding experience prior to this delivery?: Yes How long did the patient breastfeed?: 6 weeks with 29 year old  Feeding    LATCH Score                    Lactation Tools Discussed/Used    Interventions Interventions: Breast feeding basics reviewed;Education;LC Services brochure;Hand express  Discharge Pump: Personal  Consult Status Consult Status: Follow-up Date: 02/27/21 Follow-up type: In-patient    [facility] 02/26/2021, 4:50 PM

## 2021-02-26 NOTE — Transfer of Care (Signed)
Immediate Anesthesia Transfer of Care Note  Patient: Lynn Tucker  Procedure(s) Performed: CESAREAN SECTION  Patient Location: PACU  Anesthesia Type:Epidural  Level of Consciousness: awake  Airway & Oxygen Therapy: Patient Spontanous Breathing  Post-op Assessment: Report given to RN  Post vital signs: Reviewed and stable  Last Vitals:  Vitals Value Taken Time  BP 103/63 02/26/21 0700  Temp    Pulse 104 02/26/21 0702  Resp 23 02/26/21 0702  SpO2 100 % 02/26/21 0702  Vitals shown include unvalidated device data.  Last Pain:  Vitals:   02/26/21 0400  TempSrc:   PainSc: 0-No pain         Complications: No notable events documented.

## 2021-02-27 LAB — CBC
HCT: 29.9 % — ABNORMAL LOW (ref 36.0–46.0)
Hemoglobin: 10.2 g/dL — ABNORMAL LOW (ref 12.0–15.0)
MCH: 27.5 pg (ref 26.0–34.0)
MCHC: 34.1 g/dL (ref 30.0–36.0)
MCV: 80.6 fL (ref 80.0–100.0)
Platelets: 278 10*3/uL (ref 150–400)
RBC: 3.71 MIL/uL — ABNORMAL LOW (ref 3.87–5.11)
RDW: 13.7 % (ref 11.5–15.5)
WBC: 15.6 10*3/uL — ABNORMAL HIGH (ref 4.0–10.5)
nRBC: 0 % (ref 0.0–0.2)

## 2021-02-27 LAB — GLUCOSE, CAPILLARY: Glucose-Capillary: 79 mg/dL (ref 70–99)

## 2021-02-27 MED ORDER — ACETAMINOPHEN 500 MG PO TABS
1000.0000 mg | ORAL_TABLET | Freq: Four times a day (QID) | ORAL | Status: DC | PRN
  Administered 2021-02-27 (×2): 1000 mg via ORAL
  Filled 2021-02-27 (×2): qty 2

## 2021-02-27 NOTE — Anesthesia Postprocedure Evaluation (Signed)
Anesthesia Post Note  Patient: Lynn Tucker  Procedure(s) Performed: [place]     Patient location during evaluation: Mother Baby Anesthesia Type: Epidural Level of consciousness: awake and alert Pain management: pain level controlled Vital Signs Assessment: post-procedure vital signs reviewed and stable Respiratory status: spontaneous breathing, nonlabored ventilation and respiratory function stable Cardiovascular status: stable Postop Assessment: no headache, no backache and epidural receding Anesthetic complications: no   No notable events documented.  Last Vitals:  Vitals:   02/27/21 0544 02/27/21 1412  BP: (!) 114/56 (!) 101/49  Pulse: 71 66  Resp: 18 19  Temp: 36.6 C 37.1 C  SpO2: 100% 100%    Last Pain:  Vitals:   02/27/21 1557  TempSrc:   PainSc: 2    Pain Goal: Patients Stated Pain Goal: 3 (02/26/21 2325)                 Merlinda Frederick

## 2021-02-27 NOTE — Lactation Note (Signed)
This note was copied from a baby's chart. Lactation Consultation Note  Patient Name: Lynn Tucker [id number] Date: 02/27/2021 Reason for consult: Follow-up assessment;Term Age:29 hours   P2 mother whose infant is now 28 hours old.  This is a term baby at 40+1 weeks.  Mother breast fed her first child (now 29 years old) for 6 weeks.  Baby has a 6% weight loss within the first 24 hours after birth.  Baby "[name]" was swaddled and latched to the breast when I arrived.  She was sleeping and not feeding.  Discussed feeding STS to help keep baby awake and actively engaged with her feeding.  Mother may have to use gentle stimulation to keep her active.  Educated parents on removing baby from the breast if she is not feeding. Parents verbalized understanding and mother easily removed her.  Nipple intact and rounded upon release.  Offered to return at the next feeding to assist with latching if desired.  Mother will feed 8-12 times/24 hours or sooner if "[name]" shows cues.  Suggested hand expression and feeding back any EBM she gets to baby; spoon provided.  Mother has a DEBP for home use.  Father present supportive.  All questions answered.   Maternal Data Has patient been taught Hand Expression?: Yes Does the patient have breastfeeding experience prior to this delivery?: Yes How long did the patient breastfeed?: 6 weeks with her first child  Feeding Mother's Current Feeding Choice: Breast Milk  LATCH Score Latch: Grasps breast easily, tongue down, lips flanged, rhythmical sucking.  Audible Swallowing: A few with stimulation  Type of Nipple: Everted at rest and after stimulation  Comfort (Breast/Nipple): Soft / non-tender  Hold (Positioning): No assistance needed to correctly position infant at breast.  LATCH Score: 9   Lactation Tools Discussed/Used    Interventions Interventions: Breast feeding basics reviewed;Education  Discharge Pump: Personal  Consult Status Consult  Status: Follow-up Date: 02/28/21 Follow-up type: In-patient    Lynn Tucker 02/27/2021, 10:48 AM

## 2021-02-27 NOTE — Progress Notes (Signed)
Subjective: Postpartum Day 1: Cesarean Delivery Patient reports tolerating PO and her N/V has improved Pain controlled. Voiding without problem.  Breastfeeding  Objective: Vital signs in last 24 hours: Temp:  [97.6 F (36.4 C)-98.6 F (37 C)] 97.8 F (36.6 C) (11/20 0544) Pulse Rate:  [62-91] 71 (11/20 0544) Resp:  [18] 18 (11/20 0544) BP: (86-119)/(46-68) 114/56 (11/20 0544) SpO2:  [99 %-100 %] 100 % (11/20 0544) BS overall fairly normal except one value of 179 at 4pm when ate high carbs  Physical Exam:  General: alert and cooperative Lochia: appropriate Uterine Fundus: firm FBS 79 today  Recent Labs    02/25/21 1820 02/27/21 0435  HGB 11.8* 10.2*  HCT 35.5* 29.9*    Assessment/Plan: Status post Cesarean section. Doing well postoperatively.  Continue current care. BS reviewed and acceptable, d/w pt that she will need a 2 hour GTT but that we can d/c FS  Logan Bores 02/27/2021, 8:25 AM

## 2021-02-28 MED ORDER — OXYCODONE HCL 5 MG PO TABS: 5.0000 mg | ORAL_TABLET | Freq: Four times a day (QID) | ORAL | 0 refills | Status: DC | PRN

## 2021-02-28 MED ORDER — IBUPROFEN 800 MG PO TABS: 800.0000 mg | ORAL_TABLET | Freq: Three times a day (TID) | ORAL | 1 refills | Status: DC | PRN

## 2021-02-28 NOTE — Lactation Note (Signed)
This note was copied from a baby's chart. Lactation Consultation Note  Patient Name: Lynn Tucker Lynn Tucker Date: 02/28/2021 Reason for consult: Follow-up assessment Age:29 hours   P2 mother whose infant is now 20 hours old.  This is a term baby at 40+1 weeks.  Mother breast fed her first child (now 6 years old) for 6 weeks.  Baby has a 9% weight loss this morning.  Reviewed current feeding plan.  Mother feels like she has learned a lot from our session together yesterday.  She admitted to not getting a deep latch with every feeding.  Reviewed how to obtain a deep latch and mother verbalized understanding.  Reminded her to remove "Lynn Tucker" from the breast if she feel pain with breast feeding and re-latch.    Discussed current weight loss and how to help minimize any further weight loss.  Encouraged lots of STS and hand expression.  Mother interested in breast feeding for a longer duration than her first, especially since her son attends day care.  Praised her for her desire and recommended her son wash and/or sanitize immediately when he gets home from day care.  Mother in agreement.  She has a DEBP for home use and our OP phone number for any concerns.  No support person present at this time.   Maternal Data    Feeding    LATCH Score                    Lactation Tools Discussed/Used    Interventions Interventions: Breast feeding basics reviewed;Education  Discharge Discharge Education: Engorgement and breast care Pump: Personal  Consult Status Consult Status: Complete Date: 02/28/21 Follow-up type: Call as needed    Lynn Tucker 02/28/2021, 9:55 AM

## 2021-02-28 NOTE — Discharge Summary (Signed)
Postpartum Discharge Summary  Date of Service updated     Patient Name: Lynn Tucker DOB: 12-14-1990 MRN: [id number]  Date of admission: 02/25/2021 Delivery date:02/26/2021  Delivering provider: Irene Pap E  Date of discharge: 02/28/2021  Admitting diagnosis: [redacted] weeks gestation of pregnancy [Z3A.40] Intrauterine pregnancy: [redacted]w[redacted]d     Secondary diagnosis:  Principal Problem:   [redacted] weeks gestation of pregnancy  Additional problems: Diabetes    Discharge diagnosis: Term Pregnancy Delivered                                              Post partum procedures: none Augmentation: AROM and Pitocin Complications: None  Hospital course: Induction of Labor With Cesarean Section   30 y.o. yo [id number] at [redacted]w[redacted]d was admitted to the hospital 02/25/2021 for induction of labor. Patient had a labor course significant for DFM prior to admission. The patient went for cesarean section due to  fetal intolerance to labor . Delivery details are as follows: Membrane Rupture Time/Date: 9:50 PM ,02/25/2021   Delivery Method:C-Section, Low Transverse  Details of operation can be found in separate operative Note.  Patient had an uncomplicated postpartum course. She is ambulating, tolerating a regular diet, passing flatus, and urinating well.  Patient is discharged home in stable condition on 02/28/21.      Newborn Data: Birth date:02/26/2021  Birth time:6:10 AM  Gender:Female  Living status:Living  Apgars:9 ,9  Weight:3331 g                                Physical exam  Vitals:   02/27/21 0544 02/27/21 1412 02/27/21 2214 02/28/21 0619  BP: (!) 114/56 (!) 101/49 (!) 87/48 (!) 110/57  Pulse: 71 66 62 66  Resp: 18 19 18 20   Temp: 97.8 F (36.6 C) 98.7 F (37.1 C) 98.1 F (36.7 C) (!) 97.2 F (36.2 C)  TempSrc: Oral Oral Oral Oral  SpO2: 100% 100% 100% 98%  Weight:      Height:       General: alert, cooperative, and no distress Lochia: appropriate Uterine Fundus: firm Incision:  Healing well with no significant drainage, No significant erythema, Dressing is clean, dry, and intact DVT Evaluation: No evidence of DVT seen on physical exam. Negative Homan's sign. No cords or calf tenderness. Labs: Lab Results  Component Value Date   WBC 15.6 (H) 02/27/2021   HGB 10.2 (L) 02/27/2021   HCT 29.9 (L) 02/27/2021   MCV 80.6 02/27/2021   PLT 278 02/27/2021   CMP Latest Ref Rng & Units 02/25/2021  Glucose 70 - 99 mg/dL 79  BUN 6 - 20 mg/dL <5(L)  Creatinine 0.44 - 1.00 mg/dL 0.73  Sodium 135 - 145 mmol/L 134(L)  Potassium 3.5 - 5.1 mmol/L 4.1  Chloride 98 - 111 mmol/L 106  CO2 22 - 32 mmol/L 22  Calcium 8.9 - 10.3 mg/dL 9.1  Total Protein 6.5 - 8.1 g/dL 7.1  Total Bilirubin 0.3 - 1.2 mg/dL 0.6  Alkaline Phos 38 - 126 U/L 79  AST 15 - 41 U/L 19  ALT 0 - 44 U/L 12   Edinburgh Score: Edinburgh Postnatal Depression Scale Screening Tool 02/27/2021  I have been able to laugh and see the funny side of things. 0  I have looked forward with enjoyment to  things. 1  I have blamed myself unnecessarily when things went wrong. 3  I have been anxious or worried for no good reason. 0  I have felt scared or panicky for no good reason. 0  Things have been getting on top of me. 2  I have been so unhappy that I have had difficulty sleeping. 0  I have felt sad or miserable. 0  I have been so unhappy that I have been crying. 0  The thought of harming myself has occurred to me. 0  Edinburgh Postnatal Depression Scale Total 6      After visit meds:  Allergies as of 02/28/2021       Reactions   Metformin And Related    Causes oily stool & cramps   Phentermine    Anxiety, dry mouth, globus sensation        Medication List     STOP taking these medications    phentermine 37.5 MG tablet Commonly known as: ADIPEX-P   Rybelsus 7 MG Tabs Generic drug: Semaglutide       TAKE these medications    albuterol 108 (90 Base) MCG/ACT inhaler Commonly known as:  VENTOLIN HFA INHALE 2 PUFFS INTO THE LUNGS EVERY 6 HOURS AS NEEDED FOR WHEEZING OR SHORTNESS OF BREATH   Breo Ellipta 100-25 MCG/ACT Aepb Generic drug: fluticasone furoate-vilanterol Inhale 1 puff into the lungs daily.   ibuprofen 800 MG tablet Commonly known as: ADVIL Take 1 tablet (800 mg total) by mouth every 8 (eight) hours as needed.   oxyCODONE 5 MG immediate release tablet Commonly known as: Oxy IR/ROXICODONE Take 1 tablet (5 mg total) by mouth every 6 (six) hours as needed for severe pain.   prenatal multivitamin Tabs tablet Take 1 tablet by mouth daily at 12 noon.               Discharge Care Instructions  (From admission, onward)           Start     Ordered   02/28/21 0000  Leave dressing on - Keep it clean, dry, and intact until clinic visit        02/28/21 0934             Discharge home in stable condition Infant Feeding: Breast Infant Disposition:home with mother Discharge instruction: per After Visit Summary and Postpartum booklet. Activity: Advance as tolerated. Pelvic rest for 6 weeks.  Diet: carb modified diet Anticipated Birth Control: Unsure Postpartum Appointment:6 weeks Additional Postpartum F/U: Incision check 2wk    02/28/2021 Deliah Boston, MD

## 2021-02-28 NOTE — Progress Notes (Signed)
POSTPARTUM POSTOP PROGRESS NOTE  POD #2  Subjective:  No acute events overnight.  Pt denies problems with ambulating, voiding or po intake.  She denies nausea or vomiting.  Pain is well controlled.  She has had flatus. She has not had bowel movement.  Lochia Minimal.   Objective: Blood pressure (!) 110/57, pulse 66, temperature (!) 97.2 F (36.2 C), temperature source Oral, resp. rate 20, height 5' (1.524 m), weight 81.2 kg, SpO2 98 %, unknown if currently breastfeeding.  Physical Exam:  General: alert, cooperative and no distress Lochia:normal flow Chest: CTAB Heart: RRR no m/r/g Abdomen: +BS, soft, nontender Uterine Fundus: firm, 1cm below umbilicus. Honeycomb dressing intact, negdrainage Extremities: neg edema, neg calf TTP BL, neg Homans BL  Recent Labs    02/25/21 1820 02/27/21 0435  HGB 11.8* 10.2*  HCT 35.5* 29.9*    Assessment/Plan:  ASSESSMENT: Lynn Tucker is a 30 y.o. [id number] s/p PLTCS @ [redacted]w[redacted]d for fetal intol to labor. PNC c/b suspected preGDM.   Discharge home and Breastfeeding 2wk incision check, routine 6wk pp - will need 2hr GTT at this visit   LOS: 3 days

## 2021-03-01 LAB — SURGICAL PATHOLOGY

## 2021-03-02 ENCOUNTER — Inpatient Hospital Stay (HOSPITAL_COMMUNITY)
Admission: AD | Admit: 2021-03-02 | Payer: Commercial Managed Care - PPO | Source: Home / Self Care | Admitting: Obstetrics and Gynecology

## 2021-03-02 ENCOUNTER — Inpatient Hospital Stay (HOSPITAL_COMMUNITY): Payer: Commercial Managed Care - PPO

## 2021-03-09 ENCOUNTER — Telehealth (HOSPITAL_COMMUNITY): Payer: Self-pay | Admitting: *Deleted

## 2021-03-09 NOTE — Telephone Encounter (Signed)
Patient voiced no questions or concerns at this time. EPDS=1. Patient voiced no questions or concerns regarding infant at this time. Patient reports infant sleeps in a Pack-N-Play on her back. RN reviewed ABCs of safe sleep. Patient verbalized understanding. Patient requested RN email information on hospital's virtual breastfeeding and pumping support groups. Email sent. Erline Levine, RN, 03/09/21, [phone]

## 2021-08-10 ENCOUNTER — Encounter (HOSPITAL_BASED_OUTPATIENT_CLINIC_OR_DEPARTMENT_OTHER): Payer: Self-pay | Admitting: Obstetrics and Gynecology

## 2021-08-10 ENCOUNTER — Other Ambulatory Visit: Payer: Self-pay

## 2021-08-10 NOTE — Progress Notes (Signed)
Spoke w/ via phone for pre-op interview: patient ?Lab needs dos: no surgeon orders yet ?Lab results: NA ?COVID test: patient states asymptomatic no test needed. ?Arrive at [place] 08/12/21 ?NPO after MN except clear liquids.Clear liquids from MN until 0430 ?Med rec completed. ?Medications to take morning of surgery: none ?Diabetic medication: NA ?Patient instructed no nail polish to be worn day of surgery. ?Patient instructed to bring photo id and insurance card day of surgery.  ?Patient aware to have Driver (ride ) / caregiver for 24 hours after surgery. (Mom, Tonya, to drive) ?Patient Special Instructions: NA ?Pre-Op special Istructions: NA ?Patient verbalized understanding of instructions that were given at this phone interview. ?Patient denies shortness of breath, chest pain, fever, cough at this phone interview.  ?

## 2021-08-11 NOTE — Anesthesia Preprocedure Evaluation (Addendum)
Anesthesia Evaluation  ?Patient identified by MRN, date of birth, ID band ?Patient awake ? ? ? ?Reviewed: ?Allergy & Precautions, H&P , NPO status , Patient's Chart, lab work & pertinent test results ? ?Airway ?Mallampati: I ? ?TM Distance: >3 FB ?Neck ROM: Full ? ? ? Dental ?no notable dental hx. ?(+) Teeth Intact, Dental Advisory Given ?  ?Pulmonary ?neg pulmonary ROS,  ?  ?Pulmonary exam normal ?breath sounds clear to auscultation ? ? ? ? ? ? Cardiovascular ?negative cardio ROS ?Normal cardiovascular exam ?Rhythm:Regular Rate:Normal ? ? ?  ?Neuro/Psych ?PSYCHIATRIC DISORDERS negative neurological ROS ? negative psych ROS  ? GI/Hepatic ?negative GI ROS, Neg liver ROS,   ?Endo/Other  ?diabetesMorbid obesityObesity ?prediabetes ? Renal/GU ?negative Renal ROS  ?negative genitourinary ?  ?Musculoskeletal ?negative musculoskeletal ROS ?(+)  ? Abdominal ?  ?Peds ?negative pediatric ROS ?(+) Congenital Heart DiseaseH/o VSD  ? Hematology ? ?(+) Blood dyscrasia, Sickle cell trait ,   ?Anesthesia Other Findings ? ? Reproductive/Obstetrics ?negative OB ROS ? ?  ? ? ? ? ? ? ? ? ? ? ? ? ? ?  ?  ? ? ? ? ? ? ? ?Anesthesia Physical ? ?Anesthesia Plan ? ?ASA: 3 ? ?Anesthesia Plan: General  ? ?Post-op Pain Management: Minimal or no pain anticipated  ? ?Induction: Intravenous ? ?PONV Risk Score and Plan: 2 and Ondansetron and Dexamethasone ? ?Airway Management Planned: Oral ETT and LMA ? ?Additional Equipment: None ? ?Intra-op Plan:  ? ?Post-operative Plan: Extubation in OR ? ?Informed Consent: I have reviewed the patients History and Physical, chart, labs and discussed the procedure including the risks, benefits and alternatives for the proposed anesthesia with the patient or authorized representative who has indicated his/her understanding and acceptance.  ? ? ? ? ? ?Plan Discussed with: Anesthesiologist and CRNA ? ?Anesthesia Plan Comments: ( ? ?)  ? ? ? ? ? ? ?Anesthesia Quick Evaluation ? ?

## 2021-08-11 NOTE — H&P (Signed)
Lynn Tucker is an 31 y.o. female [id number] here for scheduled hysteroscopy with dilation and curettage for suspected retained products of conception.  ?Pt had a cesarean section in 02/2001. ?At her postpartum visit on 04/07/2021, a urine pregnancy test was noted to be positive.  ?Her quant HCG was then followed and noted to linger from 116-135 over the course of 77month despite use of misoprostol.  ?Pt was advised to have definitive treatment surgically and agreed to this  ?She has denied abdominal pain, vaginal discomfort or abnormal vaginal bleeding to date ? ?Pertinent Gynecological History: ?Menses: flow is moderate ?Bleeding: none ?Contraception: none ?DES exposure: denies ?Blood transfusions: none ?Sexually transmitted diseases: no past history ?Previous GYN Procedures: DNC  ?Last mammogram:  n/a  Date:  ?Last pap: normal Date: 2020 wnl ?OB History: G4, PX5400 ? ?Menstrual History: ?Menarche age: [age]?No LMP recorded. ?  ? ?Past Medical History:  ?Diagnosis Date  ? ADHD (attention deficit hyperactivity disorder)   ? Chlamydia 11/11/2015  ? H. pylori infection 04/2014  ? off antibiotics at this time  ? Heart murmur   ? Hyperlipidemia   ? Impaired fasting glucose   ? 2013  ? Molar pregnancy 04/25/2017  ? Obesity   ? Short cervix during pregnancy in second trimester 09/21/2018  ? Sickle cell trait ([place]   ? SVD (spontaneous vaginal delivery) 01/31/2019  ? Ventricular septal defect (VSD)   ? VSD (ventricular septal defect)   ? followed by cardiology every 2 years  ? Wears glasses   ? ? ?Past Surgical History:  ?Procedure Laterality Date  ? CERVICAL CERCLAGE N/A 09/22/2018  ? Procedure: CERCLAGE CERVICAL;  Surgeon: BJanyth Contes MD;  Location: [facility];  Service: Gynecology;  Laterality: N/A;  ? CESAREAN SECTION N/A 02/26/2021  ? Procedure: CESAREAN SECTION;  Surgeon: MRowland Lathe MD;  Location: [facility];  Service: Obstetrics;  Laterality: N/A;  Primary C/S for Fetal Intolerance to Labor  ?  DILATION AND EVACUATION N/A 08/05/2015  ? Procedure: DILATATION AND EVACUATION;  Surgeon: GMarylynn Pearson MD;  [facility];  Service: Gynecology;  Laterality: N/A;  ? DILATION AND EVACUATION N/A 04/25/2017  ? Procedure: DILATATION AND EVACUATION;  Surgeon: BJanyth Contes MD;  [facility];  Service: Gynecology;  Laterality: N/A;  ? WISDOM TOOTH EXTRACTION    ? ? ?Family History  ?Problem Relation Age of Onset  ? Hypertension Mother   ? Sickle cell trait Father   ? Hypertension Father   ? Cancer Other   ?     maternal side-? type  ? Hyperlipidemia Other   ?     ? side  ? Stroke Other   ?     paternal side  ? Diabetes Other   ?     paternal and maternal side  ? Thyroid disease Paternal Aunt   ? ? ?Social History:  reports that she has never smoked. She has never used smokeless tobacco. She reports that she does not drink alcohol and does not use drugs. ? ?Allergies:  ?Allergies  ?Allergen Reactions  ? Metformin And Related   ?  Causes oily stool & cramps  ? Phentermine   ?  Anxiety, dry mouth, globus sensation  ? ? ?No medications prior to admission.  ? ? ?Review of Systems  ?Constitutional:  Negative for activity change and fatigue.  ?Eyes:  Negative for photophobia and visual disturbance.  ?Respiratory:  Negative for chest tightness and shortness of breath.   ?Genitourinary:  Negative for pelvic pain, vaginal bleeding, vaginal discharge and vaginal pain.  ?Neurological:  Positive for light-headedness and headaches.  ? ?Height 5' (1.524 m), weight 79.4 kg, unknown if currently breastfeeding. ?Physical Exam ?Vitals and nursing note reviewed. Exam conducted with a chaperone present.  ?Constitutional:   ?   Appearance: Normal appearance. She is normal weight.  ?Pulmonary:  ?   Effort: Pulmonary effort is normal.  ?Abdominal:  ?   Palpations: Abdomen is soft.  ?Musculoskeletal:     ?   General: Normal range of motion.  ?Skin: ?   General: Skin is warm and dry.  ?   Capillary Refill: Capillary refill takes  2 to 3 seconds.  ?Neurological:  ?   General: No focal deficit present.  ?   Mental Status: She is alert and oriented to person, place, and time. Mental status is at baseline.  ?Psychiatric:     ?   Mood and Affect: Mood normal.     ?   Behavior: Behavior normal.     ?   Thought Content: Thought content normal.  ? ? ?No results found for this or any previous visit (from the past 24 hour(s)). ? ?No results found. ? ?Assessment/Plan: ?30yo with suspected retained products of conception here for hysteroscopy with dilation and curettage  ?- Admit for surgery ?- ERAS protocol ?- Doxycycline for prophylaxis  ?- Verify consent  ?- To OR when ready  ? ?[name] ?08/11/2021, 7:26 PM ? ?

## 2021-08-12 ENCOUNTER — Other Ambulatory Visit: Payer: Self-pay

## 2021-08-12 ENCOUNTER — Ambulatory Visit (HOSPITAL_BASED_OUTPATIENT_CLINIC_OR_DEPARTMENT_OTHER): Payer: Commercial Managed Care - PPO | Admitting: Anesthesiology

## 2021-08-12 ENCOUNTER — Encounter (HOSPITAL_BASED_OUTPATIENT_CLINIC_OR_DEPARTMENT_OTHER): Payer: Self-pay | Admitting: Obstetrics and Gynecology

## 2021-08-12 ENCOUNTER — Ambulatory Visit (HOSPITAL_BASED_OUTPATIENT_CLINIC_OR_DEPARTMENT_OTHER)
Admission: RE | Admit: 2021-08-12 | Discharge: 2021-08-12 | Disposition: A | Discharge status: Home / Self Care | Payer: Commercial Managed Care - PPO | Attending: Obstetrics and Gynecology | Admitting: Obstetrics and Gynecology

## 2021-08-12 ENCOUNTER — Encounter (HOSPITAL_BASED_OUTPATIENT_CLINIC_OR_DEPARTMENT_OTHER)
Admission: RE | Disposition: A | Discharge status: Home / Self Care | Payer: Self-pay | Source: Home / Self Care | Attending: Obstetrics and Gynecology

## 2021-08-12 DIAGNOSIS — O039 Complete or unspecified spontaneous abortion without complication: Secondary | ICD-10-CM | POA: Diagnosis not present

## 2021-08-12 DIAGNOSIS — Q249 Congenital malformation of heart, unspecified: Secondary | ICD-10-CM

## 2021-08-12 DIAGNOSIS — R7303 Prediabetes: Secondary | ICD-10-CM | POA: Diagnosis not present

## 2021-08-12 DIAGNOSIS — N858 Other specified noninflammatory disorders of uterus: Secondary | ICD-10-CM | POA: Diagnosis not present

## 2021-08-12 DIAGNOSIS — D573 Sickle-cell trait: Secondary | ICD-10-CM | POA: Diagnosis not present

## 2021-08-12 HISTORY — DX: Prediabetes: R73.03

## 2021-08-12 HISTORY — DX: Ventricular septal defect: Q21.0

## 2021-08-12 HISTORY — PX: HYSTEROSCOPY WITH D & C: SHX1775

## 2021-08-12 LAB — TYPE AND SCREEN
ABO/RH(D): A POS
Antibody Screen: NEGATIVE

## 2021-08-12 LAB — CBC
HCT: 37.5 % (ref 36.0–46.0)
Hemoglobin: 12.4 g/dL (ref 12.0–15.0)
MCH: 26.5 pg (ref 26.0–34.0)
MCHC: 33.1 g/dL (ref 30.0–36.0)
MCV: 80.1 fL (ref 80.0–100.0)
Platelets: 299 10*3/uL (ref 150–400)
RBC: 4.68 MIL/uL (ref 3.87–5.11)
RDW: 14.7 % (ref 11.5–15.5)
WBC: 4.5 10*3/uL (ref 4.0–10.5)
nRBC: 0 % (ref 0.0–0.2)

## 2021-08-12 LAB — GLUCOSE, CAPILLARY: Glucose-Capillary: 155 mg/dL — ABNORMAL HIGH (ref 70–99)

## 2021-08-12 SURGERY — DILATATION AND CURETTAGE /HYSTEROSCOPY
Anesthesia: General | Site: Uterus

## 2021-08-12 MED ORDER — KETOROLAC TROMETHAMINE 30 MG/ML IJ SOLN: 30.0000 mg | Freq: Once | INTRAMUSCULAR | Status: DC

## 2021-08-12 MED ORDER — ONDANSETRON HCL 4 MG/2ML IJ SOLN: 4.0000 mg | Freq: Once | INTRAMUSCULAR | Status: DC | PRN

## 2021-08-12 MED ORDER — KETOROLAC TROMETHAMINE 30 MG/ML IJ SOLN
INTRAMUSCULAR | Status: DC | PRN
  Administered 2021-08-12: 30 mg via INTRAVENOUS

## 2021-08-12 MED ORDER — OXYCODONE HCL 5 MG PO TABS: 5.0000 mg | ORAL_TABLET | Freq: Once | ORAL | Status: DC | PRN

## 2021-08-12 MED ORDER — MEPERIDINE HCL 25 MG/ML IJ SOLN: 6.2500 mg | INTRAMUSCULAR | Status: DC | PRN

## 2021-08-12 MED ORDER — PROPOFOL 10 MG/ML IV BOLUS
INTRAVENOUS | Status: DC | PRN
  Administered 2021-08-12: 150 mg via INTRAVENOUS

## 2021-08-12 MED ORDER — LIDOCAINE HCL (PF) 2 % IJ SOLN
INTRAMUSCULAR | Status: AC
  Filled 2021-08-12: qty 5

## 2021-08-12 MED ORDER — ACETAMINOPHEN 500 MG PO TABS
1000.0000 mg | ORAL_TABLET | ORAL | Status: AC
  Administered 2021-08-12: 1000 mg via ORAL

## 2021-08-12 MED ORDER — ONDANSETRON HCL 4 MG/2ML IJ SOLN
INTRAMUSCULAR | Status: AC
  Filled 2021-08-12: qty 2

## 2021-08-12 MED ORDER — OXYCODONE HCL 5 MG/5ML PO SOLN: 5.0000 mg | Freq: Once | ORAL | Status: DC | PRN

## 2021-08-12 MED ORDER — DOXYCYCLINE HYCLATE 100 MG IV SOLR
200.0000 mg | Freq: Once | INTRAVENOUS | Status: AC
  Administered 2021-08-12: 200 mg via INTRAVENOUS
  Filled 2021-08-12: qty 200

## 2021-08-12 MED ORDER — LIDOCAINE 2% (20 MG/ML) 5 ML SYRINGE
INTRAMUSCULAR | Status: DC | PRN
Start: 2021-08-12 — End: 2021-08-12
  Administered 2021-08-12: 100 mg via INTRAVENOUS

## 2021-08-12 MED ORDER — DEXAMETHASONE SODIUM PHOSPHATE 10 MG/ML IJ SOLN
INTRAMUSCULAR | Status: AC
  Filled 2021-08-12: qty 1

## 2021-08-12 MED ORDER — LACTATED RINGERS IV SOLN: INTRAVENOUS | Status: DC

## 2021-08-12 MED ORDER — PROPOFOL 10 MG/ML IV BOLUS
INTRAVENOUS | Status: AC
Start: 2021-08-12 — End: ?
  Filled 2021-08-12: qty 20

## 2021-08-12 MED ORDER — ACETAMINOPHEN 500 MG PO TABS
ORAL_TABLET | ORAL | Status: AC
  Filled 2021-08-12: qty 2

## 2021-08-12 MED ORDER — LIDOCAINE HCL 1 % IJ SOLN
INTRAMUSCULAR | Status: DC | PRN
  Administered 2021-08-12: 7 mL

## 2021-08-12 MED ORDER — IBUPROFEN 800 MG PO TABS: 800.0000 mg | ORAL_TABLET | Freq: Three times a day (TID) | ORAL | 1 refills | Status: DC | PRN

## 2021-08-12 MED ORDER — POVIDONE-IODINE 10 % EX SWAB: 2.0000 "application " | Freq: Once | CUTANEOUS | Status: DC

## 2021-08-12 MED ORDER — FENTANYL CITRATE (PF) 100 MCG/2ML IJ SOLN
INTRAMUSCULAR | Status: AC
Start: 2021-08-12 — End: ?
  Filled 2021-08-12: qty 2

## 2021-08-12 MED ORDER — ARTIFICIAL TEARS OPHTHALMIC OINT
TOPICAL_OINTMENT | OPHTHALMIC | Status: AC
  Filled 2021-08-12: qty 3.5

## 2021-08-12 MED ORDER — MIDAZOLAM HCL 2 MG/2ML IJ SOLN
INTRAMUSCULAR | Status: DC | PRN
  Administered 2021-08-12: 2 mg via INTRAVENOUS

## 2021-08-12 MED ORDER — FENTANYL CITRATE (PF) 100 MCG/2ML IJ SOLN
INTRAMUSCULAR | Status: DC | PRN
  Administered 2021-08-12: 50 ug via INTRAVENOUS

## 2021-08-12 MED ORDER — FENTANYL CITRATE (PF) 100 MCG/2ML IJ SOLN: 25.0000 ug | INTRAMUSCULAR | Status: DC | PRN

## 2021-08-12 MED ORDER — KETOROLAC TROMETHAMINE 30 MG/ML IJ SOLN
INTRAMUSCULAR | Status: AC
  Filled 2021-08-12: qty 1

## 2021-08-12 MED ORDER — OXYCODONE HCL 5 MG PO TABS
5.0000 mg | ORAL_TABLET | Freq: Four times a day (QID) | ORAL | 0 refills | Status: AC | PRN
Start: 2021-08-12 — End: 2021-08-15

## 2021-08-12 MED ORDER — ACETAMINOPHEN 325 MG PO TABS: 325.0000 mg | ORAL_TABLET | ORAL | Status: DC | PRN

## 2021-08-12 MED ORDER — ACETAMINOPHEN 160 MG/5ML PO SOLN: 325.0000 mg | ORAL | Status: DC | PRN

## 2021-08-12 MED ORDER — DEXAMETHASONE SODIUM PHOSPHATE 10 MG/ML IJ SOLN
INTRAMUSCULAR | Status: DC | PRN
Start: 2021-08-12 — End: 2021-08-12
  Administered 2021-08-12: 10 mg via INTRAVENOUS

## 2021-08-12 MED ORDER — ONDANSETRON HCL 4 MG/2ML IJ SOLN
INTRAMUSCULAR | Status: DC | PRN
  Administered 2021-08-12: 4 mg via INTRAVENOUS

## 2021-08-12 MED ORDER — SODIUM CHLORIDE 0.9 % IR SOLN
Status: DC | PRN
Start: 2021-08-12 — End: 2021-08-12
  Administered 2021-08-12: 3000 mL

## 2021-08-12 MED ORDER — MIDAZOLAM HCL 2 MG/2ML IJ SOLN
INTRAMUSCULAR | Status: AC
  Filled 2021-08-12: qty 2

## 2021-08-12 SURGICAL SUPPLY — 14 items
CATH ROBINSON RED A/P 16FR (CATHETERS) ×2 IMPLANT
DILATOR CANAL MILEX (MISCELLANEOUS) IMPLANT
DRSG TELFA 3X8 NADH (GAUZE/BANDAGES/DRESSINGS) ×2 IMPLANT
GAUZE 4X4 16PLY ~~LOC~~+RFID DBL (SPONGE) ×3 IMPLANT
GLOVE BIO SURGEON STRL SZ 6.5 (GLOVE) ×3 IMPLANT
GOWN STRL REUS W/TWL LRG LVL3 (GOWN DISPOSABLE) ×4 IMPLANT
IV NS IRRIG 3000ML ARTHROMATIC (IV SOLUTION) ×3 IMPLANT
KIT PROCEDURE FLUENT (KITS) ×2 IMPLANT
KIT TURNOVER CYSTO (KITS) ×3 IMPLANT
PACK VAGINAL MINOR WOMEN LF (CUSTOM PROCEDURE TRAY) ×3 IMPLANT
PAD DRESSING TELFA 3X8 NADH (GAUZE/BANDAGES/DRESSINGS) ×2 IMPLANT
PAD OB MATERNITY 4.3X12.25 (PERSONAL CARE ITEMS) ×3 IMPLANT
SEAL ROD LENS SCOPE MYOSURE (ABLATOR) ×3 IMPLANT
TOWEL OR 17X26 10 PK STRL BLUE (TOWEL DISPOSABLE) ×6 IMPLANT

## 2021-08-12 NOTE — Transfer of Care (Signed)
Immediate Anesthesia Transfer of Care Note ? ?Patient: Lynn Tucker ? ?Procedure(s) Performed: Procedure(s) (LRB): ?DILATATION AND CURETTAGE /HYSTEROSCOPY (N/A) ? ?Patient Location: PACU ? ?Anesthesia Type: General ? ?Level of Consciousness: sleepy ? ?Airway & Oxygen Therapy: Patient Spontanous Breathing and Patient connected to face mask oxygen, oral airway remaining. ? ?Post-op Assessment: Report given to PACU RN and Post -op Vital signs reviewed and stable ? ?Post vital signs: Reviewed and stable ? ?Complications: No apparent anesthesia complications ? ?Last Vitals:  ?Vitals Value Taken Time  ?BP 109/71 08/12/21 0811  ?Temp    ?Pulse 74 08/12/21 0818  ?Resp 18 08/12/21 0818  ?SpO2 100 % 08/12/21 0818  ?Vitals shown include unvalidated device data. ? ?Last Pain:  ?Vitals:  ? 08/12/21 0554  ?TempSrc: Oral  ?PainSc: 0-No pain  ?   ? ?Patients Stated Pain Goal: 3 (08/12/21 0554) ? ?Complications: No notable events documented. ?

## 2021-08-12 NOTE — Interval H&P Note (Signed)
History and Physical Interval Note: ?Pt reports no changes since H/P done ?Reviewed expectations during and after surgery ?Consent confirmed ?To OR when ready ? ?08/12/2021 ?7:22 AM ? ?Lynn Tucker  has presented today for surgery, with the diagnosis of miscarriage.  The various methods of treatment have been discussed with the patient and family. After consideration of risks, benefits and other options for treatment, the patient has consented to  Procedure(s): ?DILATATION AND CURETTAGE /HYSTEROSCOPY (N/A) as a surgical intervention.  The patient's history has been reviewed, patient examined, no change in status, stable for surgery.  I have reviewed the patient's chart and labs.  Questions were answered to the patient's satisfaction.   ? ? ?[name] ? ? ?

## 2021-08-12 NOTE — Anesthesia Postprocedure Evaluation (Signed)
Anesthesia Post Note ? ?Patient: Lynn Tucker ? ?Procedure(s) Performed: DILATATION AND CURETTAGE /HYSTEROSCOPY (Uterus) ? ?  ? ?Patient location during evaluation: PACU ?Anesthesia Type: General ?Level of consciousness: awake and alert ?Pain management: pain level controlled ?Vital Signs Assessment: post-procedure vital signs reviewed and stable ?Respiratory status: spontaneous breathing, nonlabored ventilation, respiratory function stable and patient connected to nasal cannula oxygen ?Cardiovascular status: blood pressure returned to baseline and stable ?Postop Assessment: no apparent nausea or vomiting ?Anesthetic complications: no ? ? ?No notable events documented. ? ?Last Vitals:  ?Vitals:  ? 08/12/21 0902 08/12/21 0948  ?BP:  118/70  ?Pulse: 65 65  ?Resp: 14 12  ?Temp: 36.7 ?C 36.7 ?C  ?SpO2: 100% 100%  ?  ?Last Pain:  ?Vitals:  ? 08/12/21 0948  ?TempSrc:   ?PainSc: 0-No pain  ? ? ?  ?  ?  ?  ?  ?  ? ?Bunny Lowdermilk ? ? ? ? ?

## 2021-08-12 NOTE — Op Note (Signed)
Operative Note ? ? ? ?Preoperative Diagnosis ?Persistent elevated quantitative HCG post delivery ? ? ?Postoperative Diagnosis: Same ? ? ?Procedure: Hysteroscopy dilation and curettage ? ? ?Surgeon: Mickle Mallory, DO ? ?Anesthesia: MaC ? ?Fluids: LR 536m ?EBL: <140m?UOP: voided prior to OR ? ? ?Findings: Grossly normal appearing uterine cavity with follicular appearance in posterior lower uterine wall close to internal os. Normal appearance and location of ostia bilaterally ? ? ?Specimen: Endometrial curettings ? ? ?Procedure Note ?Patient was taken to the operating room where general anesthesia was administered without difficulty. She was then prepped and draped in the normal sterile fashion while in the dorsal lithotomy position. An appropriate time out was performed.  ?An exam under anesthesia noted an anteverted uterus.  A speculum was then placed within the vagina and the anterior lip of the cervix identified and grasped with a single toothed tenaculum. Uterus was then sounded to 7cm.  The Pratt dilators were utilized to dilate the cervix up to approximately 19. A hysteroscope was then placed with the following findings noted: ? ?Grossly normal appearing uterine cavity with follicular appearance in posterior lower uterine wall close to internal os. Normal appearance and location of ostia bilaterally ? ?Next, a sharp curettage was performed. Samples were obtained in all four quadrants.  ?At this point, the tenaculum was removed; site was rendered hemostatic with a silver nitrate stick.  The speculum was then removed. Pt was cleaned and awakened. She was taken to the recovery room in stable condition.  ?Counts were correct per nursing staff during and at the end of the procedure  ?

## 2021-08-12 NOTE — Anesthesia Procedure Notes (Signed)
Procedure Name: LMA Insertion ?Date/Time: 08/12/2021 7:41 AM ?Performed by: Mechele Claude, CRNA ?Pre-anesthesia Checklist: Patient identified, Emergency Drugs available, Suction available and Patient being monitored ?Patient Re-evaluated:Patient Re-evaluated prior to induction ?Oxygen Delivery Method: Circle system utilized ?Preoxygenation: Pre-oxygenation with 100% oxygen ?Induction Type: IV induction ?Ventilation: Mask ventilation without difficulty ?LMA: LMA inserted ?LMA Size: 4.0 ?Number of attempts: 1 ?Airway Equipment and Method: Bite block ?Placement Confirmation: positive ETCO2 ?Tube secured with: Tape ?Dental Injury: Teeth and Oropharynx as per pre-operative assessment  ? ? ? ? ?

## 2021-08-12 NOTE — Discharge Instructions (Addendum)
Call office with any concerns [phone] ? ?Post Anesthesia Home Care Instructions ? ?Activity: ?Get plenty of rest for the remainder of the day. A responsible adult should stay with you for 24 hours following the procedure.  ?For the next 24 hours, DO NOT: ?-Drive a car ?-Paediatric nurse ?-Drink alcoholic beverages ?-Take any medication unless instructed by your physician ?-Make any legal decisions or sign important papers. ? ?Meals: ?Start with liquid foods such as gelatin or soup. Progress to regular foods as tolerated. Avoid greasy, spicy, heavy foods. If nausea and/or vomiting occur, drink only clear liquids until the nausea and/or vomiting subsides. Call your physician if vomiting continues. ? ?Special Instructions/Symptoms: ?Your throat may feel dry or sore from the anesthesia or the breathing tube placed in your throat during surgery. If this causes discomfort, gargle with warm salt water. The discomfort should disappear within 24 hours. ?. ? ?   ?

## 2021-08-15 ENCOUNTER — Encounter (HOSPITAL_BASED_OUTPATIENT_CLINIC_OR_DEPARTMENT_OTHER): Payer: Self-pay | Admitting: Obstetrics and Gynecology

## 2021-08-15 LAB — SURGICAL PATHOLOGY

## 2021-11-16 ENCOUNTER — Encounter (INDEPENDENT_AMBULATORY_CARE_PROVIDER_SITE_OTHER): Payer: Self-pay

## 2021-12-16 ENCOUNTER — Telehealth: Payer: Self-pay

## 2021-12-16 NOTE — Telephone Encounter (Signed)
error 

## 2021-12-19 ENCOUNTER — Telehealth: Payer: Self-pay | Admitting: *Deleted

## 2021-12-19 NOTE — Telephone Encounter (Signed)
Spoke with the patient regarding the referral to GYN oncology. Patient scheduled as new patient with Dr Ernestina Patches on 9/18 at 11:15 am.  Patient given an arrival time of 10:45 am.  Explained to the patient the the doctor will perform a pelvic exam at this visit. Patient given the policy that no visitors under the 16 yrs are allowed in the [place]. Patient given the address/phone number for the clinic and that the center offers free valet service.

## 2021-12-26 ENCOUNTER — Inpatient Hospital Stay: Payer: Medicaid Other

## 2021-12-26 ENCOUNTER — Inpatient Hospital Stay: Payer: Medicaid Other | Attending: Psychiatry | Admitting: Psychiatry

## 2021-12-26 ENCOUNTER — Encounter: Payer: Self-pay | Admitting: Psychiatry

## 2021-12-26 ENCOUNTER — Other Ambulatory Visit: Payer: Self-pay

## 2021-12-26 VITALS — BP 141/78 | HR 70 | Temp 99.0°F | Resp 16 | Ht 60.0 in | Wt 172.0 lb

## 2021-12-26 DIAGNOSIS — Q210 Ventricular septal defect: Secondary | ICD-10-CM | POA: Insufficient documentation

## 2021-12-26 DIAGNOSIS — F909 Attention-deficit hyperactivity disorder, unspecified type: Secondary | ICD-10-CM | POA: Insufficient documentation

## 2021-12-26 DIAGNOSIS — O019 Hydatidiform mole, unspecified: Secondary | ICD-10-CM

## 2021-12-26 DIAGNOSIS — O0281 Inappropriate change in quantitative human chorionic gonadotropin (hCG) in early pregnancy: Secondary | ICD-10-CM

## 2021-12-26 DIAGNOSIS — Z6833 Body mass index (BMI) 33.0-33.9, adult: Secondary | ICD-10-CM | POA: Insufficient documentation

## 2021-12-26 DIAGNOSIS — E669 Obesity, unspecified: Secondary | ICD-10-CM | POA: Diagnosis not present

## 2021-12-26 DIAGNOSIS — E785 Hyperlipidemia, unspecified: Secondary | ICD-10-CM | POA: Insufficient documentation

## 2021-12-26 DIAGNOSIS — R871 Abnormal level of hormones in specimens from female genital organs: Secondary | ICD-10-CM | POA: Diagnosis not present

## 2021-12-26 DIAGNOSIS — R011 Cardiac murmur, unspecified: Secondary | ICD-10-CM | POA: Diagnosis not present

## 2021-12-26 DIAGNOSIS — D573 Sickle-cell trait: Secondary | ICD-10-CM | POA: Diagnosis not present

## 2021-12-26 DIAGNOSIS — Z8759 Personal history of other complications of pregnancy, childbirth and the puerperium: Secondary | ICD-10-CM | POA: Insufficient documentation

## 2021-12-26 LAB — CMP (CANCER CENTER ONLY)
ALT: 50 U/L — ABNORMAL HIGH (ref 0–44)
AST: 42 U/L — ABNORMAL HIGH (ref 15–41)
Albumin: 4.8 g/dL (ref 3.5–5.0)
Alkaline Phosphatase: 66 U/L (ref 38–126)
Anion gap: 9 (ref 5–15)
BUN: 6 mg/dL (ref 6–20)
CO2: 25 mmol/L (ref 22–32)
Calcium: 9.5 mg/dL (ref 8.9–10.3)
Chloride: 104 mmol/L (ref 98–111)
Creatinine: 0.94 mg/dL (ref 0.44–1.00)
GFR, Estimated: >60 mL/min (ref >60)
Glucose, Bld: 106 mg/dL — ABNORMAL HIGH (ref 70–99)
Potassium: 3.9 mmol/L (ref 3.5–5.1)
Sodium: 138 mmol/L (ref 135–145)
Total Bilirubin: 0.4 mg/dL (ref 0.3–1.2)
Total Protein: 8.5 g/dL — ABNORMAL HIGH (ref 6.5–8.1)

## 2021-12-26 LAB — CBC WITH DIFFERENTIAL (CANCER CENTER ONLY)
Abs Immature Granulocytes: 0.01 10*3/uL (ref 0.00–0.07)
Basophils Absolute: 0 10*3/uL (ref 0.0–0.1)
Basophils Relative: 1 %
Eosinophils Absolute: 0.1 10*3/uL (ref 0.0–0.5)
Eosinophils Relative: 2 %
HCT: 40.2 % (ref 36.0–46.0)
Hemoglobin: 13.4 g/dL (ref 12.0–15.0)
Immature Granulocytes: 0 %
Lymphocytes Relative: 31 %
Lymphs Abs: 1.3 10*3/uL (ref 0.7–4.0)
MCH: 26.9 pg (ref 26.0–34.0)
MCHC: 33.3 g/dL (ref 30.0–36.0)
MCV: 80.6 fL (ref 80.0–100.0)
Monocytes Absolute: 0.4 10*3/uL (ref 0.1–1.0)
Monocytes Relative: 9 %
Neutro Abs: 2.3 10*3/uL (ref 1.7–7.7)
Neutrophils Relative %: 57 %
Platelet Count: 322 10*3/uL (ref 150–400)
RBC: 4.99 MIL/uL (ref 3.87–5.11)
RDW: 14.6 % (ref 11.5–15.5)
WBC Count: 4 10*3/uL (ref 4.0–10.5)
nRBC: 0 % (ref 0.0–0.2)

## 2021-12-26 NOTE — Progress Notes (Addendum)
GYNECOLOGIC ONCOLOGY NEW PATIENT CONSULTATION  Date of Service: 12/26/2021 Referring Provider: Sherlyn Hay, DO [address] [place],  [place] [address]   ASSESSMENT AND PLAN: Lynn Tucker is a 31 y.o. 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  Patient has a history of a prior molar pregnancy, but most recently delivered a term pregnancy via cesarean delivery in 02/2021, negative placental pathology.  Since January she has been noted to have positive beta hCG with a maximal value of 2739 on 10/21/2021, most recently down trended to 742 on 12/09/2021 without intervention.  She had a dilation and curettage in May with negative pathology.  In office ultrasound was negative.  Given that she has had a persistent detectable hCG for more than 6 months, this is concerning for GTN.  Will complete work-up with a CT chest/abdomen/pelvis to evaluate for evidence of metastatic disease.  No evidence of disease on speculum exam vaginally.  We will also get updated blood work with a CBC with differential, CMP, beta-hCG today.  Reviewed with patient the nature of GTN.  Discussed that treatment of low risk disease can be with a single agent like methotrexate.  At this time she has a WHO score of at least 1:4 given antecedent term pregnancy and interval from pregnancy of 10 months.  Will update score following imaging.  Recommend that patient resume oral birth control pills.  Discussed that we can revisit an IUD as she prefers, in the future, once her work-up is complete.  Discussed that reliable contraception is of great importance during work-up and treatment so as not to confuse her response to therapy.  We will notify patient of CT scan results when available.  A copy of this note was sent to the patient's referring provider.  Bernadene Bell, MD Gynecologic Oncology   Medical Decision Making I personally spent  TOTAL 40 minutes  face-to-face and non-face-to-face in the care of this patient, which includes all pre, intra, and post visit time on the date of service.  10 minutes spent reviewing records prior to the visit 25 Minutes in patient contact 5 minutes charting , conferring with consultants etc.   ------------  CC: Elevated beta-hCG  HISTORY OF PRESENT ILLNESS:  Lynn Tucker is a 31 y.o. woman who is seen in consultation at the request of Sherlyn Hay, * for evaluation of concern for GTN.  Patient has a history of molar pregnancy in 05/07/2017 that was diagnosed following D&C.  Her quant was followed to negative then every month for 6 months.  Most recently she delivered via cesarean delivery on 02/26/2021 for a term pregnancy.  When patient presented for an IUD placement in January 2023, she was noted to have a faintly positive UPT, and a positive beta hCG.  Her beta hCG was trended as below, and patient underwent a D&C on 08/12/2021 with path resulting without any evidence of chorionic villi, hyperplasia, or malignancy.  Her beta was continued to be trended and she underwent an ultrasound on 11/03/2021 which was overall negative.  Today, patient reports that she first noted a positive urine pregnancy test a few weeks after her delivery.  She believes that she was given Cytotec at some point this spring prior to her D&C.  She notes she had a small amount of bleeding and cramping following that medication, but nothing significant.  Then following the [facility] in May, patient reports that she may have had one period in June  no other periods since.  She was taking the Loestrin birth control pills, but she stopped about 2 months ago.  She does reports that she has been sexually active during the course of this work-up.   Beta trend: 04/26/2021: 115 05/04/2021: 138 05/31/2021: 122 06/07/2021: 202 06/28/2021: 135 10/19/2021: 2664 10/21/2021: 2739 11/04/2021: 1444 11/18/2021: 1058 12/09/2021: 742   PAST MEDICAL  HISTORY: Past Medical History:  Diagnosis Date   ADHD (attention deficit hyperactivity disorder)    Chlamydia 11/11/2015   H. pylori infection 04/2014   off antibiotics at this time   Heart murmur    Hyperlipidemia    Impaired fasting glucose    2013   Molar pregnancy 04/25/2017   Obesity    Pre-diabetes    Short cervix during pregnancy in second trimester 09/21/2018   Sickle cell trait (HCC)    SVD (spontaneous vaginal delivery) 01/31/2019   Ventricular septal defect (VSD)    VSD (ventricular septal defect)    followed by cardiology every 2 years   Wears glasses     PAST SURGICAL HISTORY: Past Surgical History:  Procedure Laterality Date   CERVICAL CERCLAGE N/A 09/22/2018   Procedure: CERCLAGE CERVICAL;  Surgeon: Janyth Contes, MD;  Location: [facility];  Service: Gynecology;  Laterality: N/A;   CESAREAN SECTION N/A 02/26/2021   Procedure: CESAREAN SECTION;  Surgeon: Rowland Lathe, MD;  Location: [facility];  Service: Obstetrics;  Laterality: N/A;  Primary C/S for Fetal Intolerance to Bud N/A 08/05/2015   Procedure: DILATATION AND EVACUATION;  Surgeon: Marylynn Pearson, MD;  Location: [facility];  Service: Gynecology;  Laterality: N/A;   DILATION AND EVACUATION N/A 04/25/2017   Procedure: DILATATION AND EVACUATION;  Surgeon: Janyth Contes, MD;  Location: [facility];  Service: Gynecology;  Laterality: N/A;   HYSTEROSCOPY WITH D & C N/A 08/12/2021   Procedure: DILATATION AND CURETTAGE /HYSTEROSCOPY;  Surgeon: Sherlyn Hay, DO;  Location: [facility];  Service: Gynecology;  Laterality: N/A;   WISDOM TOOTH EXTRACTION      OB/GYN HISTORY: OB History  Gravida Para Term Preterm AB Living  '4 2 2 '$ 0 2 2  SAB IAB Ectopic Multiple Live Births  2 0 0 0 2    # Outcome Date GA Lbr Len/2nd Weight Sex Delivery Anes PTL Lv  4 Term 02/26/21 [redacted]w[redacted]d 7 lb 5.5 oz (3.331 kg) F CS-LTranv EPI  LIV  3 Term 01/31/19 [id number]:00 / 00:52 6  lb 0.5 oz (2.735 kg) M Vag-Spont EPI  LIV     Birth Comments: none  2 SAB 09/04/15          1 SAB             Obstetric Comments  D&E for incomplete abortion      Age at menarche: [age] of HRT: Loestrin OCP pills, stopped 2 months ago Hx of STI: Chlamydia Last pap: 07/04/2018, normal History of abnormal pap smears: No  SCREENING STUDIES:  Last mammogram: N/A Last colonoscopy: N/A  MEDICATIONS:  Current Outpatient Medications:    ibuprofen (ADVIL) 800 MG tablet, Take 1 tablet (800 mg total) by mouth every 8 (eight) hours as needed for moderate pain or cramping., Disp: 20 tablet, Rfl: 1  ALLERGIES: Allergies  Allergen Reactions   Metformin And Related     Causes oily stool & cramps   Phentermine     Anxiety, dry mouth, globus sensation    FAMILY HISTORY: Family History  Problem Relation Age of Onset   Hypertension Mother    Sickle cell trait Father    Hypertension Father    Thyroid disease Paternal Aunt    Cancer Other        maternal side-? type   Hyperlipidemia Other        ? side   Stroke Other        paternal side   Diabetes Other        paternal and maternal side   Breast cancer Neg Hx    Ovarian cancer Neg Hx    Colon cancer Neg Hx    Uterine cancer Neg Hx     SOCIAL HISTORY: Social History   Socioeconomic History   Marital status: Single    Spouse name: Not on file   Number of children: Not on file   Years of education: Not on file   Highest education level: Not on file  Occupational History   Not on file  Tobacco Use   Smoking status: Former    Types: Cigarettes   Smokeless tobacco: Never  Vaping Use   Vaping Use: Never used  Substance and Sexual Activity   Alcohol use: No    Alcohol/week: 1.0 standard drink of alcohol    Types: 1 Standard drinks or equivalent per week    Comment: random   Drug use: No    Comment: THC in past 30 days per pt on 12/26/21   Sexual activity: Yes    Partners: Male    Birth control/protection: None  Other  Topics Concern   Not on file  Social History Narrative   Working at pharmacy, Clinical cytogeneticist.  Exercise - walking.  Has 30yo.   06/2020.   Social Determinants of Health   Financial Resource Strain: Low Risk  (09/20/2018)   Overall Financial Resource Strain (CARDIA)    Difficulty of Paying Living Expenses: Not hard at all  Food Insecurity: No Food Insecurity (09/20/2018)   Hunger Vital Sign    Worried About Running Out of Food in the Last Year: Never true    Ran Out of Food in the Last Year: Never true  Transportation Needs: Unknown (09/20/2018)   PRAPARE - Hydrologist (Medical): No    Lack of Transportation (Non-Medical): Not on file  Physical Activity: Not on file  Stress: Stress Concern Present (09/20/2018)   [place]    Feeling of Stress : To some extent  Social Connections: Not on file  Intimate Partner Violence: Not At Risk (09/20/2018)   Humiliation, Afraid, Rape, and Kick questionnaire    Fear of Current or Ex-Partner: No    Emotionally Abused: No    Physically Abused: No    Sexually Abused: No    REVIEW OF SYSTEMS: New patient intake form was reviewed.  Complete 10-system review is negative except for the following: Change in appetite, anxiety, "do not feel like myself"  PHYSICAL EXAM: BP (!) 141/78 (BP Location: Left Arm, Patient Position: Sitting)   Pulse 70   Temp 99 F (37.2 C) (Oral)   Resp 16   Ht 5' (1.524 m)   Wt 172 lb (78 kg)   BMI 33.59 kg/m  Constitutional: No acute distress. Neuro/Psych: Alert, oriented.  Head and Neck: Normocephalic, atraumatic. Neck symmetric without masses. Sclera anicteric.  Respiratory: Normal work of breathing. Clear to auscultation bilaterally. Cardiovascular: Regular rate and rhythm Abdomen: Normoactive bowel sounds. Soft, non-distended, non-tender to palpation.  No masses or hepatosplenomegaly appreciated. No evidence of hernia.   Well-healed Pfannenstiel incision. Extremities: Grossly normal range of motion. Warm, well perfused. No edema bilaterally. Skin: No rashes or lesions. Lymphatic: No cervical, supraclavicular, or inguinal adenopathy. Genitourinary: External genitalia without lesions. Urethral meatus without lesions or prolapse. On speculum exam, vagina and cervix without lesions. Bimanual exam reveals normal cervix and uterus, no adnexal masses or tenderness. Exam chaperoned by Joylene John, NP  LABORATORY AND RADIOLOGIC DATA: Outside medical records were reviewed to synthesize the above history, along with the history and physical obtained during the visit.  Outside laboratory, pathology, and imaging reports were reviewed, with pertinent results below.   WBC  Date Value Ref Range Status  08/12/2021 4.5 4.0 - 10.5 K/uL Final   Hemoglobin  Date Value Ref Range Status  08/12/2021 12.4 12.0 - 15.0 g/dL Final  05/05/2020 11.3 11.1 - 15.9 g/dL Final   HCT  Date Value Ref Range Status  08/12/2021 37.5 36.0 - 46.0 % Final   Hematocrit  Date Value Ref Range Status  05/05/2020 34.4 34.0 - 46.6 % Final   Platelets  Date Value Ref Range Status  08/12/2021 299 150 - 400 K/uL Final  05/05/2020 404 150 - 450 x10E3/uL Final   Creat  Date Value Ref Range Status  03/13/2016 0.83 0.50 - 1.10 mg/dL Final   Creatinine, Ser  Date Value Ref Range Status  02/25/2021 0.73 0.44 - 1.00 mg/dL Final   AST  Date Value Ref Range Status  02/25/2021 19 15 - 41 U/L Final   ALT  Date Value Ref Range Status  02/25/2021 12 0 - 44 U/L Final    Beta trend: 04/26/2021: 115 05/04/2021: 138 05/31/2021: 122 06/07/2021: 202 06/28/2021: 135 10/19/2021: 2664 10/21/2021: 2739 11/04/2021: 1444 11/18/2021: 1058 12/09/2021: 742  Outside pelvic ultrasound (11/03/2021) Impression: No IUP or ectopic pregnancy visualized.  Uterus within normal limits, endometrium within normal limits, right ovary enlarged but otherwise within normal  limits, left ovary within normal limits

## 2021-12-27 LAB — BETA HCG QUANT (REF LAB): hCG Quant: 833 m[IU]/mL

## 2021-12-30 ENCOUNTER — Encounter (HOSPITAL_COMMUNITY): Payer: Self-pay | Admitting: Radiology

## 2021-12-30 ENCOUNTER — Ambulatory Visit (HOSPITAL_COMMUNITY)
Admission: RE | Admit: 2021-12-30 | Discharge: 2021-12-30 | Disposition: A | Discharge status: Home / Self Care | Payer: Medicaid Other | Source: Ambulatory Visit | Attending: Psychiatry | Admitting: Psychiatry

## 2021-12-30 DIAGNOSIS — O019 Hydatidiform mole, unspecified: Secondary | ICD-10-CM | POA: Diagnosis not present

## 2021-12-30 DIAGNOSIS — R918 Other nonspecific abnormal finding of lung field: Secondary | ICD-10-CM | POA: Diagnosis not present

## 2021-12-30 DIAGNOSIS — K3189 Other diseases of stomach and duodenum: Secondary | ICD-10-CM | POA: Diagnosis not present

## 2021-12-30 DIAGNOSIS — K6389 Other specified diseases of intestine: Secondary | ICD-10-CM | POA: Diagnosis not present

## 2021-12-30 DIAGNOSIS — D1800 Hemangioma unspecified site: Secondary | ICD-10-CM | POA: Diagnosis not present

## 2021-12-30 MED ORDER — IOHEXOL 300 MG/ML  SOLN
100.0000 mL | Freq: Once | INTRAMUSCULAR | Status: AC | PRN
  Administered 2021-12-30: 100 mL via INTRAVENOUS

## 2022-01-03 ENCOUNTER — Telehealth: Payer: Self-pay | Admitting: Psychiatry

## 2022-01-03 DIAGNOSIS — C7800 Secondary malignant neoplasm of unspecified lung: Secondary | ICD-10-CM | POA: Insufficient documentation

## 2022-01-03 DIAGNOSIS — C58 Malignant neoplasm of placenta: Secondary | ICD-10-CM | POA: Insufficient documentation

## 2022-01-03 DIAGNOSIS — O019 Hydatidiform mole, unspecified: Secondary | ICD-10-CM | POA: Insufficient documentation

## 2022-01-03 NOTE — Telephone Encounter (Signed)
Called pt to review her CT scan results.   Based on results, patient has at least stage III:5 gestational trophoblastic disease, with concern for up to stage III:7. Uterine myometrium heterogenous without discrete mass measured by radiology, but based on my measure of abnormal area of myometrium, up to 5cm. Two nodules in right lung concerning for metastatic disease.  Given high risk GTN following a term pregnancy and long duration since delivery, have recommended multi-agent regimen to patient. Also, given evidence of lung mets on CT, have recommended proceeding with brain MRI to rule out evidence of brain mets.   Discussed with patient her fertility desires. Patient expressed that she does not plan for any future pregnancies. Discussed that at this time, we would recommend starting with chemotherapy, but depending on response, I may recommend a hysterectomy in the future. She is amenable to this if recommended.   Brain MRI ordered. Will work on medical oncology referral. All questions answered.

## 2022-01-04 ENCOUNTER — Other Ambulatory Visit: Payer: Self-pay | Admitting: Psychiatry

## 2022-01-04 ENCOUNTER — Telehealth: Payer: Self-pay | Admitting: *Deleted

## 2022-01-04 MED ORDER — ALPRAZOLAM 0.5 MG PO TABS: 0.5000 mg | ORAL_TABLET | Freq: Once | ORAL | 0 refills | Status: DC | PRN

## 2022-01-04 NOTE — Telephone Encounter (Signed)
Per Dr Ernestina Patches, scheduled the patient for a MRI. Patient requested some sedation for the scan. Explained that the message will be given to Dr Gaspar Cola APP and the office will call her back tomorrow

## 2022-01-05 ENCOUNTER — Telehealth: Payer: Self-pay | Admitting: *Deleted

## 2022-01-05 ENCOUNTER — Other Ambulatory Visit: Payer: Self-pay | Admitting: Psychiatry

## 2022-01-05 DIAGNOSIS — O019 Hydatidiform mole, unspecified: Secondary | ICD-10-CM

## 2022-01-05 NOTE — Telephone Encounter (Signed)
Explained to the patient that the medicine was called in to her pharmacy yesterday. Also explained per Dr Ernestina Patches ok to breastfeed

## 2022-01-05 NOTE — Telephone Encounter (Signed)
Per Dr Ernestina Patches canceled appt for tomorrow with Dr Alvy Bimler and notified the patient

## 2022-01-05 NOTE — Telephone Encounter (Signed)
Per Dr Alvy Bimler, scheduled the patient to see her on 9/29 at 11 am. Patient asked to arrive at 10:30 am

## 2022-01-06 ENCOUNTER — Other Ambulatory Visit: Payer: Self-pay | Admitting: Psychiatry

## 2022-01-06 ENCOUNTER — Inpatient Hospital Stay: Payer: Medicaid Other | Admitting: Hematology and Oncology

## 2022-01-06 DIAGNOSIS — O019 Hydatidiform mole, unspecified: Principal | ICD-10-CM

## 2022-01-06 DIAGNOSIS — C577 Malignant neoplasm of other specified female genital organs: Principal | ICD-10-CM

## 2022-01-06 MED ORDER — FILGRASTIM 300 MCG/ML INJECTION VIAL
Freq: Every day | SUBCUTANEOUS | 3 refills | 6 days | Status: CP
Start: 2022-01-06 — End: 2022-01-30
  Filled 2022-01-16: qty 6, 28d supply, fill #0

## 2022-01-08 ENCOUNTER — Ambulatory Visit (HOSPITAL_COMMUNITY)
Admission: RE | Admit: 2022-01-08 | Discharge: 2022-01-08 | Disposition: A | Discharge status: Home / Self Care | Payer: Medicaid Other | Source: Ambulatory Visit | Attending: Psychiatry | Admitting: Psychiatry

## 2022-01-08 DIAGNOSIS — Z85118 Personal history of other malignant neoplasm of bronchus and lung: Secondary | ICD-10-CM | POA: Diagnosis not present

## 2022-01-08 DIAGNOSIS — O019 Hydatidiform mole, unspecified: Secondary | ICD-10-CM | POA: Insufficient documentation

## 2022-01-08 DIAGNOSIS — C7800 Secondary malignant neoplasm of unspecified lung: Secondary | ICD-10-CM | POA: Insufficient documentation

## 2022-01-08 MED ORDER — GADOPICLENOL 0.5 MMOL/ML IV SOLN
8.0000 mL | Freq: Once | INTRAVENOUS | Status: AC | PRN
  Administered 2022-01-08: 8 mL via INTRAVENOUS

## 2022-01-09 NOTE — Unmapped (Signed)
[facility] Specialty Medication Onboarding    Specialty Medication: Neupogen  Prior Authorization: Not Required   Financial Assistance: No - copay  <$25  Final Copay/Day Supply: $4 / 6    Insurance Restrictions: None     Notes to Pharmacist:     The triage team has completed the benefits investigation and has determined that the patient is able to fill this medication at [facility]. Please contact the patient to complete the onboarding or follow up with the prescribing physician as needed.

## 2022-01-10 NOTE — Unmapped (Signed)
Clinical Pharmacist Brief Note:    Attempted to call patient several times throughout day for filgrastim education. Patient answered once but call was disconnected prior to starting conversation. All other calls were not answered and there was no voicemail box. Patient is inactive on MyChart.     Trilby Drummer, PharmD, BCOP  Hematology/Oncology Pharmacist

## 2022-01-11 ENCOUNTER — Telehealth: Payer: Self-pay | Admitting: Psychiatry

## 2022-01-11 ENCOUNTER — Encounter: Payer: Self-pay | Admitting: Psychiatry

## 2022-01-11 ENCOUNTER — Telehealth: Payer: Self-pay

## 2022-01-11 ENCOUNTER — Other Ambulatory Visit: Payer: Self-pay | Admitting: Psychiatry

## 2022-01-11 DIAGNOSIS — R930 Abnormal findings on diagnostic imaging of skull and head, not elsewhere classified: Secondary | ICD-10-CM

## 2022-01-11 NOTE — Unmapped (Signed)
VIR pre call attempt.  Patient was unaware of appointment.  States she will likely need to reschedule if she can't find anyone to come with her.  Given scheduling number.  Will call back if she is able to keep appointment.

## 2022-01-11 NOTE — Unmapped (Signed)
VIR pre procedure prep call completed. Reviewed to register at 1300 through main entrance at [facility] then proceed to [facility] on 2nd floor.  Informed of no show/late cancellation policy. NPO guidelines reviewed. Pt OK to take sips of clear liquids with all AM meds. Pt aware of need for driver >30 years of age. Pt verbalized understanding. All questions answered.

## 2022-01-11 NOTE — Telephone Encounter (Addendum)
Pt called stating appointment for Lynn Tucker placement is in [place] and she would like the procedure done in [place].  After speaking with [facility] NP, called patient and explained rational for keeping PAC placement within the [facility] system.

## 2022-01-11 NOTE — Telephone Encounter (Signed)
Called pt to review MRI results. No evidence of metastatic dz. Results otherwise note:  "Small amount of scattered foci of T2 hyperintensity within the white matter of the cerebral hemispheres, nonspecific, more pronounced than expected for age. Differential diagnosis include demyelinating disease, vasculitis, migraine, post inflammatory/infectious processes or early chronic microangiopathy in the setting of uncontrolled risk factors."  Uncertain of the significance of these findings. Discussed with patient non urgent referral to neurology for further evaluation.  Pt getting port at [facility] tomorrow. Inpatient chemo scheduled to start 10/13.

## 2022-01-12 ENCOUNTER — Ambulatory Visit: Admit: 2022-01-12 | Discharge: 2022-01-12 | Payer: BLUE CROSS/BLUE SHIELD

## 2022-01-12 DIAGNOSIS — O019 Hydatidiform mole, unspecified: Secondary | ICD-10-CM | POA: Diagnosis not present

## 2022-01-12 DIAGNOSIS — C577 Malignant neoplasm of other specified female genital organs: Principal | ICD-10-CM

## 2022-01-12 MED ADMIN — sodium chloride (NS) 0.9 % infusion: INTRAVENOUS | @ 18:00:00 | Stop: 2022-01-12

## 2022-01-12 MED ADMIN — midazolam (VERSED) injection: INTRAVENOUS | @ 19:00:00 | Stop: 2022-01-12

## 2022-01-12 MED ADMIN — fentaNYL (PF) (SUBLIMAZE) injection: INTRAVENOUS | @ 19:00:00 | Stop: 2022-01-12

## 2022-01-12 MED ADMIN — lidocaine-EPINEPHrine (XYLOCAINE W/EPI) 1 %-1:100,000 injection: SUBCUTANEOUS | @ 18:00:00 | Stop: 2022-01-12

## 2022-01-12 MED ADMIN — heparin lock flush (porcine) 100 unit/mL injection: INTRAVENOUS | @ 19:00:00 | Stop: 2022-01-12

## 2022-01-12 MED ADMIN — lidocaine-EPINEPHrine (XYLOCAINE W/EPI) 1 %-1:100,000 injection: SUBCUTANEOUS | @ 19:00:00 | Stop: 2022-01-12

## 2022-01-12 MED ADMIN — fentaNYL (PF) (SUBLIMAZE) injection: INTRAVENOUS | @ 18:00:00 | Stop: 2022-01-12

## 2022-01-12 MED ADMIN — midazolam (VERSED) injection: INTRAVENOUS | @ 18:00:00 | Stop: 2022-01-12

## 2022-01-12 MED ADMIN — diazePAM (VALIUM) tablet 5 mg: 5 mg | ORAL | @ 17:00:00 | Stop: 2022-01-12

## 2022-01-12 NOTE — Unmapped (Unsigned)
**incomplete [facility]ing[facility]   Patient Onboarding/Medication Counseling    Robin Gillespie is a 31 y.o. female with Gestational trophoblastic neoplasm who I am counseling today on initiation of therapy.  I am speaking to {Blank:19197::the patient,the patient's caregiver, ***,the patient's family member, ***,***}.    Was a Nurse, learning disability used for this call? {Blank single:19197::Yes, ***. Patient language is appropriate in WAM,No}    Verified patient's date of birth / HIPAA.    Specialty medication(s) to be sent: Hematology/Oncology: Neupogen      Non-specialty medications/supplies to be sent: n/a      Medications not needed at this time: n/a         Neupogen (filgrastim) syringe    Medication & Administration     Dosage: 1 mL (300 mcg total) under the skin daily for 24 days. Administer 24 hours after completion of inpatient chemotherapy. Repeat every 24 hours for 3 days.    Administration: Inject under the skin of the thigh, abdomen, buttocks or upper arm. Rotate sites with each injection.    Neupogen Syringe:  1. Take 1 syringe out of the refrigerator and allow to stand at room temperature for at least 30 minutes  2. Wash hands and remove syringe from the tray  3. Check the syringe for expiration date. Ensure medication is clear and colorless and free from particles. It should appear unused and the gray needle cap should be securely attached and the needle guard should not be activated.   4. Choose your injection site (abdomen but not within 2 inches of navel, thigh or if someone else is injecting may also use upper arms or upper outer area of buttocks)  5. Clean the injection site with an alcohol wipe using a circular motion and allow to air dry completely  6. Pull the gray needle cap straight off and discard  7. Your healthcare provider has prescribed either a full syringe dose or a partial syringe dose.  If you are prescribed a full dose, you will inject all of the medication.  If you are prescribed a partial dose point the needle up and tap gently until the air rises to the top and slowly push the plunger rod up to the line on the syringe barrel that matches your prescribed dose.  8. Pinch the skin and insert the needle at a 45-90 degree angle (keep skin pinched while injecting)  9. Push the plunger head down to deliver dose using a slow and constant pressure until the plunger head reaches the bottom and hold syringe in place for 5 seconds  10. When the syringe is empty, pull the needle straight out of the skin and activate the needle guard. To activate the needle guard- pull the orange safety guard with your free hand and slide over the needle until the needle is completely covered and the needle guard clicks into place.  If an audible click is not heard, the needle guard may not be completely activated  11. If there is blood at the injection site gently press a cotton ball or gauze to the site. Do not rub the injection site.  12. Dispose of the used prefilled syringe into a sharps container or hard plastic bottle.    Adherence/Missed dose instruction: If a dose is missed, call your doctor.    Goals of Therapy     Stimulate the growth of neutrophils (a type of white blood important to fight against infection) used after  chemotherapy.    Side Effects & Monitoring Parameters   Injection site irritation  Pain/aching in the bones, arms and legs    The following side effects should be reported to the provider:  Signs of an allergic reaction    Contraindications, Warnings, & Precautions     Hypersensitivity  Hypersensitivity to latex (needle cap contains latex)      Drug/Food Interactions     Medication list reviewed in Epic. The patient was instructed to inform the care team before taking any new medications or supplements. No drug interactions identified.     Storage, Handling Precautions, & Disposal     Neupogen should be stored in the refrigerator.   Avoid freezing but if frozen may be thawed one time  Throw away Neupogen that has been left at room temperature for more than 24 hours or frozen more than 1 time  Do not shake   Keep out of the reach of children  Only use the vial/syringe/needle one time then discard  Place used devices into a sharps container for disposal (which we can supply along with band-aids and alcohol pads) or hard plastic container       Current Medications (including OTC/herbals), Comorbidities and Allergies     No current facility-administered medications for this visit.     No current outpatient medications on file.     Facility-Administered Medications Ordered in Other Visits   Medication Dose Route Frequency Provider Last Rate Last Admin    diazePAM (VALIUM) 5 MG tablet             fentaNYL (PF) (SUBLIMAZE) 50 mcg/mL injection             fentaNYL (PF) (SUBLIMAZE) injection   Intravenous Code/Trauma/Sedation Med [name], MD   25 mcg at 01/12/22 1432    lidocaine-EPINEPHrine (XYLOCAINE W/EPI) 1 %-1:100,000 injection    Code/Trauma/Sedation Med Philippa Sicks, MD   3 mL at 01/12/22 1431    midazolam (PF) (VERSED) 5 mg/mL injection             midazolam (VERSED) injection    Code/Trauma/Sedation Med [name], MD   1 mg at 01/12/22 1432    sodium chloride (NS) 0.9 % infusion   Intravenous Continuous PRN [name], MD 50 mL/hr at 01/12/22 1406 50 mL/hr at 01/12/22 1406       No Known Allergies    Patient Active Problem List   Diagnosis    Gestational trophoblastic neoplasm       Reviewed and up to date in Epic.    Appropriateness of Therapy     Acute infections noted within Epic:  No active infections  Patient reported infection: {Blank single:19197::None,***- patient reported to provider,***- pharmacy reported to provider}    Is medication and dose appropriate based on diagnosis and infection status? {Blank single:19197::Yes,No - evidence provided by prescriber in *** note}    Prescription has been clinically reviewed: Yes      Baseline Quality of Life Assessment      How many days over the past month did your condition  keep you from your normal activities? For example, brushing your teeth or getting up in the morning. {Blank:19197::***,0,Patient declined to answer}    Financial Information     Medication Assistance provided: None Required    Anticipated copay of $4 reviewed with patient. Verified delivery address.    Delivery Information     Scheduled delivery date: ***    Expected start date: ***  Medication will be delivered via {Blank:19197::UPS,Next Day Courier,Same Day Courier,Clinic Courier - *** clinic,***} to the {Blank:19197::prescription,temporary} address in Epic WAM.  This shipment {Blank single:19197::will,will not} require a signature.      Explained the services we provide at [facility] and that each month we would call to set up refills.  Stressed importance of returning phone calls so that we could ensure they receive their medications in time each month.  Informed patient that we should be setting up refills 7-10 days prior to when they will run out of medication.  A pharmacist will reach out to perform a clinical assessment periodically.  Informed patient that a welcome packet, containing information about our pharmacy and other support services, a Notice of Privacy Practices, and a drug information handout will be sent.      The patient or caregiver noted above participated in the development of this care plan and knows that they can request review of or adjustments to the care plan at any time.      Patient or caregiver verbalized understanding of the above information as well as how to contact the pharmacy at [phone] option 4 with any questions/concerns.  The pharmacy is open Monday through Friday 8:30am-4:30pm.  A pharmacist is available 24/7 via pager to answer any clinical questions they may have.    Patient Specific Needs     Does the patient have any physical, cognitive, or cultural barriers? {Blank single:19197::No,Yes - ***}    Does the patient have adequate living arrangements? (i.e. the ability to store and take their medication appropriately) {Blank single:19197::Yes,No - ***}    Did you identify any home environmental safety or security hazards? {Blank single:19197::No,Yes - ***}    Patient prefers to have medications discussed with  Patient     Is the patient or caregiver able to read and understand education materials at a high school level or above? Yes    Patient's primary language is  English     Is the patient high risk? No    SOCIAL DETERMINANTS OF HEALTH     At the [facility], we have learned that life circumstances - like trouble affording food, housing, utilities, or transportation can affect the health of many of our patients.   That is why we wanted to ask: are you currently experiencing any life circumstances that are negatively impacting your health and/or quality of life? {YES/NO/PATIENTDECLINED:93004}    Social Determinants of Health     Financial Resource Strain: Not on file   Internet Connectivity: Not on file   Food Insecurity: Not on file   Tobacco Use: Low Risk  (01/12/2022)    Patient History     Smoking Tobacco Use: Never     Smokeless Tobacco Use: Never     Passive Exposure: Not on file   Housing/Utilities: Not on file   Alcohol Use: Not on file   Transportation Needs: Not on file   Substance Use: Not on file   Health Literacy: Not on file   Physical Activity: Not on file   Interpersonal Safety: Not on file   Stress: Not on file   Intimate Partner Violence: Not on file   Depression: Not on file   Social Connections: Not on file       Would you be willing to receive help with any of the needs that you have identified today? {Yes/No/Not applicable:93005}       Herberto Ledwell Vangie Bicker, PharmD  [facility]  Pharmacy Specialty Pharmacist

## 2022-01-12 NOTE — Unmapped (Signed)
VIR Post-Procedure Note    Procedure Name: IR INSERT PORT AGE GREATER THAN 5 YRS [[id number]]    Pre-Op Diagnosis: gestational trophoblastic disease     Post-Op Diagnosis: Same as pre-operative diagnosis    VIR Providers    Time out: Prior to the procedure, a time out was performed with all team members present. During the time out, the patient, procedure and procedure site when applicable were verbally verified by the team members and [name], MD.     Attending: Dr. [name], MD  Assistant: Dr. Lucienne Capers, MD    Description of procedure: successful placement of RIJ single lumen port    Sedation: Moderate    Estimated Blood Loss: approximately <10 mL  Specimens: None   Contrast: 0 mL  Complications: None      See detailed procedure note with images in PACS [facility]).    The patient tolerated the procedure well without incident or complication and was returned to the PRU in stable condition.    Philippa Sicks, MD  01/12/2022 3:22 PM

## 2022-01-12 NOTE — Unmapped (Signed)
 INTERVENTIONAL RADIOLOGY - Pre Procedure H/P  Patient name: Carol Sidney  CSN: [id number]  MRN: [id number]  Date of Procedure: @TODAY @      Assessment/Plan:    Ms. Roston is a 31 y.o. female who will undergo port placement in Interventional Radiology.    Consent obtained in the Pre Op holding area by [facility].  Risks, benefits, and alternatives including but not limited to bleeding, infection, infection requiring removal were discussed with patient/patient's representative. All questions were answered to patient/patient's representative satisfaction.  Patient/Patient's representative consents and would like to proceed with the procedure.   --The patient will accept blood products in an emergent situation.  --The patient does not have a Do Not Resuscitate order in effect.      HPI: Ms. Klingele is a 31 y.o. female with history of gestational trophoblastic disease with need for port for treatment. No line history noted.     No past medical history on file.    No past surgical history on file.     Allergies: No Known Allergies    Medications:  No relevant medications, please see full medication list in Epic.    ASA Grade: ASA 2 - Patient with mild systemic disease with no functional limitations    PE:    Vitals:    01/12/22 1240   BP: 128/77   Pulse: 60   Resp: 16   Temp: 36.3 ??C (97.3 ??F)   SpO2: 100%     General: female in NAD.  Airway assessment: Class 2 - Can visualize soft palate and fauces, tip of uvula is obscured  Lungs: Respirations nonlabored

## 2022-01-13 MED ORDER — EMPTY CONTAINER
2 refills | 0 days
Start: 2022-01-13 — End: ?

## 2022-01-13 NOTE — Unmapped (Signed)
Patient called for post op check after port placement.  Encouraged to continue to take OTC pain medication and utilize ice pack wrapped in washcloth to help manage pain.  Pt reports tiny spot of drainage/blood on port dressing that has not grown in size and is not actively bleeding.  Encourage pt to call after hours number in AVS if she notices pus/discharge from the port site when she changes the dressing tomorrow.  Instructed pt to hold pressure to site and go to ER if site is actively bleeding.  Understanding verbalized.  No other concerns, all questions addressed.

## 2022-01-16 MED FILL — EMPTY CONTAINER: 120 days supply | Qty: 1 | Fill #0

## 2022-01-20 ENCOUNTER — Ambulatory Visit
Admit: 2022-01-20 | Discharge: 2022-01-22 | Disposition: A | Discharge status: Home / Self Care | Payer: BLUE CROSS/BLUE SHIELD

## 2022-01-20 DIAGNOSIS — O019 Hydatidiform mole, unspecified: Secondary | ICD-10-CM | POA: Diagnosis not present

## 2022-01-20 DIAGNOSIS — C7801 Secondary malignant neoplasm of right lung: Secondary | ICD-10-CM | POA: Diagnosis not present

## 2022-01-20 DIAGNOSIS — Z5111 Encounter for antineoplastic chemotherapy: Secondary | ICD-10-CM | POA: Diagnosis not present

## 2022-01-20 DIAGNOSIS — C549 Malignant neoplasm of corpus uteri, unspecified: Secondary | ICD-10-CM | POA: Diagnosis not present

## 2022-01-20 DIAGNOSIS — C7800 Secondary malignant neoplasm of unspecified lung: Secondary | ICD-10-CM | POA: Diagnosis not present

## 2022-01-20 DIAGNOSIS — F419 Anxiety disorder, unspecified: Secondary | ICD-10-CM | POA: Diagnosis not present

## 2022-01-20 LAB — COMPREHENSIVE METABOLIC PANEL
ALBUMIN: 4 g/dL (ref 3.4–5.0)
ALKALINE PHOSPHATASE: 69 U/L (ref 46–116)
ALT (SGPT): 16 U/L (ref 10–49)
ANION GAP: 6 mmol/L (ref 5–14)
AST (SGOT): 19 U/L (ref <=34)
BILIRUBIN TOTAL: 0.9 mg/dL (ref 0.3–1.2)
BLOOD UREA NITROGEN: 7 mg/dL — ABNORMAL LOW (ref 9–23)
BUN / CREAT RATIO: 8
CALCIUM: 9 mg/dL (ref 8.7–10.4)
CHLORIDE: 104 mmol/L (ref 98–107)
CO2: 28 mmol/L (ref 20.0–31.0)
CREATININE: 0.83 mg/dL — ABNORMAL HIGH
EGFR CKD-EPI (2021) FEMALE: >90 mL/min/{1.73_m2} (ref >=60)
GLUCOSE RANDOM: 113 mg/dL (ref 70–179)
POTASSIUM: 3.6 mmol/L (ref 3.4–4.8)
PROTEIN TOTAL: 7.6 g/dL (ref 5.7–8.2)
SODIUM: 138 mmol/L (ref 135–145)

## 2022-01-20 LAB — CBC W/ AUTO DIFF
BASOPHILS ABSOLUTE COUNT: 0 10*9/L (ref 0.0–0.1)
BASOPHILS RELATIVE PERCENT: 0.5 %
EOSINOPHILS ABSOLUTE COUNT: 0.1 10*9/L (ref 0.0–0.5)
EOSINOPHILS RELATIVE PERCENT: 1.3 %
HEMATOCRIT: 32.5 % — ABNORMAL LOW (ref 34.0–44.0)
HEMOGLOBIN: 11.3 g/dL (ref 11.3–14.9)
LYMPHOCYTES ABSOLUTE COUNT: 1.2 10*9/L (ref 1.1–3.6)
LYMPHOCYTES RELATIVE PERCENT: 21.3 %
MEAN CORPUSCULAR HEMOGLOBIN CONC: 34.7 g/dL (ref 32.0–36.0)
MEAN CORPUSCULAR HEMOGLOBIN: 27.4 pg (ref 25.9–32.4)
MEAN CORPUSCULAR VOLUME: 78.8 fL (ref 77.6–95.7)
MEAN PLATELET VOLUME: 8.7 fL (ref 6.8–10.7)
MONOCYTES ABSOLUTE COUNT: 0.4 10*9/L (ref 0.3–0.8)
MONOCYTES RELATIVE PERCENT: 7.2 %
NEUTROPHILS ABSOLUTE COUNT: 3.9 10*9/L (ref 1.8–7.8)
NEUTROPHILS RELATIVE PERCENT: 69.7 %
PLATELET COUNT: 255 10*9/L (ref 150–450)
RED BLOOD CELL COUNT: 4.12 10*12/L (ref 3.95–5.13)
RED CELL DISTRIBUTION WIDTH: 15.2 % (ref 12.2–15.2)
WBC ADJUSTED: 5.6 10*9/L (ref 3.6–11.2)

## 2022-01-20 LAB — URINALYSIS WITH MICROSCOPY
BILIRUBIN UA: NEGATIVE
BLOOD UA: NEGATIVE
GLUCOSE UA: NEGATIVE
KETONES UA: NEGATIVE
LEUKOCYTE ESTERASE UA: NEGATIVE
NITRITE UA: NEGATIVE
PH UA: 8 (ref 5.0–9.0)
PROTEIN UA: NEGATIVE
RBC UA: <1 /HPF (ref <=4)
SPECIFIC GRAVITY UA: 1.012 (ref 1.003–1.030)
SQUAMOUS EPITHELIAL: 3 /HPF (ref 0–5)
UROBILINOGEN UA: 2 — AB
WBC UA: <1 /HPF (ref 0–5)

## 2022-01-20 LAB — HCG QUANTITATIVE, BLOOD: GONADOTROPIN, CHORIONIC (HCG) QUANT: 1676.2 m[IU]/mL

## 2022-01-20 MED ORDER — SYRINGE 3 ML 25 GAUGE X 1"
Freq: Two times a day (BID) | 0 refills | 0 days | Status: CP | PRN
Start: 2022-01-20 — End: ?
  Filled 2022-01-22: qty 3, 3d supply, fill #0

## 2022-01-20 MED ADMIN — hydrOXYzine (ATARAX) tablet 25 mg: 25 mg | ORAL | @ 20:00:00 | Stop: 2022-01-20

## 2022-01-20 MED ADMIN — sodium bicarbonate 100 mEq in dextrose 5 % 1,100 mL continuous infusion: 250 mL/h | INTRAVENOUS | @ 21:00:00 | Stop: 2022-01-20

## 2022-01-20 NOTE — Unmapped (Signed)
Robin Gillespie is a 31 y.o. with *** who was admitted for cycle #*** of *** chemotherapy. Patient has had no*** adverse reactions to her previous treatments. She tolerated this cycle well with no interventions indicated. Her hemoglobin remained stable. She was continued on her home medications for ***. Patient was discharged on HD#*** and will follow up with Dr. Marland Kitchen for further treatment planning.

## 2022-01-20 NOTE — Unmapped (Cosign Needed)
Gyn Oncology Admission History and Physical     Assessment/Plan:    Robin Gillespie is a 31 y.o. 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     Onc: Gestational Trophoblastic Neoplasm  - Remote h/o molar preg 2019 followed to hCG undetectable x 6 months. Most recent preg > term CS 02/2021 (normal placental path)  - 04/07/2021: Px for IUD w/ +UPT; hCG persistently elevated > D&C 5/5 > negative pathology   - Last hCG 9/18: 833 (max 2739 on 7/14) > stat hCG ordered with admit labs  - 9/22 CT CAP: Heterogenous uterus w/ myometrium up to 5 cm, solid pulm nodules in RLL and lingula c/w metastatic disease  - 10/1 MRI Brain: incidental scatter foci of T2 nonspecific > referred to Neurology on 10/4.   - Strongly desires fertility preservation. Risk factors include GTN remote from term pregnancy.  - Plan for 3 cycles of EMA (D1-2) then outpatient CO (D8)  (D1-2) on Fri/Sat's with outpatient CO (D8) on Fridays.   - Plan for home administration of Neupogen 300 mcg every day x 3 starting 24 hrs after completion of inpatient chemo (D3-5). [facility]  - Will plan for discharge with remaining leucovorin (q12h x 4 starting 24hrs after methotrexate dose), as well as Zofran 8mg  PO TID PRN and Compazine 10 mg PO q6h PRN.     Anxiety  - Anxiety can precipitate nausea/vomiting; Ativan and Atarax ordered PRN    PPx: SCDs, ambulation, IS, Lovenox ordered for    Discussed w/ Dr. Chester Holstein and attending Dr. Alvester Morin, who was in agreement with this plan.    Chief Complaint: EMA-CO chemotherapy    History of Present Illness:   Robin Gillespie is a 31 y.o. 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 She will be undergoing her first round of chemotherapy during this admission.     The patient has a history of molar pregnancy, confirmed on pathology on 05/07/2017 following D&C. Her beta quant was followed until reaching zero, and then subsequently followed every month for 6 months. Since this event, the patient has had two term pregnancies with most recent pregnancy ending in term delivery via cesarean section in 02/2021 and negative placental pathology.     She presented for IUD placement in January 2023 and had a positive beta hCG with continued rise to a maximum value of 2739 on 10/21/2021, most recently down trended to 742 on 12/09/2021 without intervention. She had a dilation and curettage in May with negative pathology and negative in office ultrasound. Following this procedure, she was started on Loestrin birth control pills, and has only had one period in June 2023. Given this persistent detectable hCG for over 6 months, she underwent a work up with CT Chest/Abdomen/Pelvis which showed heterogeneous appearance of the uterus and solid pulmonary nodules in the right lower lobe and lingula. MRI Brain did not show evidence of metastatic disease but did show a small amount of nonspecific scattered foci of T2 hyperintensity within the white matter of the cerebral hemispheres more pronounced than expected for age. A plan was made for chemotherapy and a port was placed 8 days ago.  Oncology History    No history exists.       Principal Problem:    Gestational trophoblastic neoplasm      No past medical history on file.      Past Surgical History:   Procedure Laterality Date    IR INSERT PORT AGE GREATER THAN 5 YRS  01/12/2022    IR INSERT PORT AGE GREATER THAN 5 YRS 01/12/2022 [name], MD IMG [facility]       OB History   No obstetric history on file.       Social History     Socioeconomic History    Marital status: Single   Tobacco Use    Smoking status: Never    Smokeless tobacco: Never       No family history on file.      Review of systems: Review of Systems - Negative except anxiety and pain at port site s/p recent port placement.      Labs: Prechemo labs were drawn on 10/13 and are pending.   All lab results last 24 hours:    Recent Results (from the past 24 hour(s))   ECG 12 Lead    Collection Time: 01/20/22  4:35 PM   Result Value Ref Range    EKG Systolic BP  mmHg    EKG Diastolic BP  mmHg    EKG Ventricular Rate 72 BPM    EKG Atrial Rate 72 BPM    EKG P-R Interval 150 ms    EKG QRS Duration 78 ms    EKG Q-T Interval 348 ms    EKG QTC Calculation 381 ms    EKG Calculated P Axis 69 degrees    EKG Calculated R Axis 25 degrees    EKG Calculated T Axis 56 degrees    QTC Fredericia 370 ms       PE:   BP 130/64  - Pulse 81  - Temp 36.9 ??C (98.4 ??F) (Oral)  - Resp 16  - Ht 151.1 cm (4' 11.5)  - Wt 79.6 kg (175 lb 7.8 oz)  - SpO2 100%  - BMI 34.85 kg/m??     -General:   Well-appearing in NAD.  -Lymph:   No marked lymphadenopathy.  -CV:    RRR. No m/r/g.  -Pulm:   CTAB. Good air movement. Normal WOB.  -Abd:    Non-tender, non-distended abdomen. No guarding, no rebound. Normal bowel sounds.   -Extremities:   No clubbing, erythema, or edema.   -Psych:   Appropriate.The patient is awake and alert.  -GU:    Deferred.    Scribe's Attestation: Gerlene Fee, MD obtained and performed the history, physical exam and medical decision making elements that were  entered into the chart. Documentation assistance was provided by me personally, a scribe. Signed by Marylin Crosby Scribe, on January 20, 2022 at 8:13 AM.       ----------------------------------------------------------------------------------------------------------------------  January 20, 2022 4:46 PM. Documentation assistance provided by the Scribe. I was present during the time the encounter was recorded. The information recorded by the Scribe was done at my direction and has been reviewed and validated by me.  ----------------------------------------------------------------------------------------------------------------------\

## 2022-01-21 DIAGNOSIS — O019 Hydatidiform mole, unspecified: Secondary | ICD-10-CM | POA: Diagnosis not present

## 2022-01-21 LAB — URINALYSIS WITH MICROSCOPY
BACTERIA: NONE SEEN /HPF
BILIRUBIN UA: NEGATIVE
BLOOD UA: NEGATIVE
GLUCOSE UA: NEGATIVE
KETONES UA: NEGATIVE
NITRITE UA: NEGATIVE
PH UA: 8 (ref 5.0–9.0)
PROTEIN UA: NEGATIVE
RBC UA: <1 /HPF (ref <=4)
SPECIFIC GRAVITY UA: 1.007 (ref 1.003–1.030)
SQUAMOUS EPITHELIAL: 4 /HPF (ref 0–5)
UROBILINOGEN UA: <2
WBC UA: 2 /HPF (ref 0–5)

## 2022-01-21 LAB — BASIC METABOLIC PANEL
ANION GAP: 5 mmol/L (ref 5–14)
BLOOD UREA NITROGEN: 7 mg/dL — ABNORMAL LOW (ref 9–23)
BUN / CREAT RATIO: 8
CALCIUM: 8.7 mg/dL (ref 8.7–10.4)
CHLORIDE: 106 mmol/L (ref 98–107)
CO2: 26 mmol/L (ref 20.0–31.0)
CREATININE: 0.92 mg/dL — ABNORMAL HIGH
EGFR CKD-EPI (2021) FEMALE: 87 mL/min/{1.73_m2} (ref >=60)
GLUCOSE RANDOM: 173 mg/dL (ref 70–179)
POTASSIUM: 4.4 mmol/L (ref 3.4–4.8)
SODIUM: 137 mmol/L (ref 135–145)

## 2022-01-21 MED ORDER — LORATADINE 10 MG TABLET
ORAL_TABLET | Freq: Every day | ORAL | 0 refills | 30 days
Start: 2022-01-21 — End: 2023-01-21

## 2022-01-21 MED ORDER — ONDANSETRON HCL 4 MG TABLET
ORAL_TABLET | Freq: Four times a day (QID) | ORAL | 0 refills | 8 days | PRN
Start: 2022-01-21 — End: ?

## 2022-01-21 MED ORDER — PROCHLORPERAZINE MALEATE 10 MG TABLET
ORAL_TABLET | Freq: Four times a day (QID) | ORAL | 0 refills | 8 days | PRN
Start: 2022-01-21 — End: ?

## 2022-01-21 MED ORDER — LEUCOVORIN CALCIUM 15 MG TABLET
ORAL_TABLET | Freq: Two times a day (BID) | ORAL | 0 refills | 1 days
Start: 2022-01-21 — End: 2022-01-22

## 2022-01-21 MED ADMIN — etoposide (VEPESID) 182 mg in sodium chloride NON-PVC (NS) 0.9 % 500 mL IVPB: 100 mg/m2 | INTRAVENOUS | @ 01:00:00 | Stop: 2022-01-22

## 2022-01-21 MED ADMIN — sodium bicarbonate 100 mEq in dextrose 5 % 1,100 mL continuous infusion: 150 mL/h | INTRAVENOUS | @ 02:00:00 | Stop: 2022-01-21

## 2022-01-21 MED ADMIN — hydrOXYzine (ATARAX) tablet 25 mg: 25 mg | ORAL | @ 10:00:00

## 2022-01-21 MED ADMIN — DACTINomycin (COSMEGEN) syringe: .5 mg | INTRAVENOUS | @ 02:00:00 | Stop: 2022-01-22

## 2022-01-21 MED ADMIN — dexAMETHasone (DECADRON) tablet 12 mg: 12 mg | ORAL | @ 01:00:00 | Stop: 2022-01-20

## 2022-01-21 MED ADMIN — enoxaparin (LOVENOX) syringe 40 mg: 40 mg | SUBCUTANEOUS | @ 13:00:00

## 2022-01-21 MED ADMIN — sodium bicarbonate 100 mEq in dextrose 5 % 1,100 mL continuous infusion: 150 mL/h | INTRAVENOUS | @ 10:00:00 | Stop: 2022-01-21

## 2022-01-21 MED ADMIN — fosaprepitant (EMEND) 150 mg in sodium chloride (NS) 0.9 % 100 mL IVPB: 150 mg | INTRAVENOUS | @ 01:00:00 | Stop: 2022-01-20

## 2022-01-21 MED ADMIN — diphenhydrAMINE (BENADRYL) capsule/tablet 25 mg: 25 mg | ORAL | @ 01:00:00 | Stop: 2022-01-22

## 2022-01-21 MED ADMIN — famotidine (PEPCID) tablet 40 mg: 40 mg | ORAL | @ 01:00:00 | Stop: 2022-01-22

## 2022-01-21 MED ADMIN — ondansetron (ZOFRAN) tablet 24 mg: 24 mg | ORAL | @ 01:00:00 | Stop: 2022-01-22

## 2022-01-21 MED ADMIN — methotrexate (Preservative Free) 546 mg in sodium chloride (NS) 0.9 % 1,000 mL IVPB: 300 mg/m2 | INTRAVENOUS | @ 03:00:00

## 2022-01-21 NOTE — Unmapped (Signed)
Patient was screened for tobacco use disorder and denies current use.     Kirstie Peri, MD, MPH  Resident Physician  [facility]  Department of Obstetrics and Gynecology

## 2022-01-21 NOTE — Unmapped (Signed)
VSS. Pt free from falls and injuries. Pt received chemo without complications. Methotrexate currently infusing. Sodium bicarb running per order. UA to be collected and sent. Denies pain, nausea, vomiting. Visitor at bedside. Chemo precautions maintained. All needs met and tended to. All questions answered.     Problem: Adult Inpatient Plan of Care  Goal: Plan of Care Review  Outcome: Progressing  Goal: Patient-Specific Goal (Individualized)  Outcome: Progressing  Goal: Absence of Hospital-Acquired Illness or Injury  Outcome: Progressing  Intervention: Identify and Manage Fall Risk  Recent Flowsheet Documentation  Taken 01/20/2022 1915 by Laroy Apple, RN  Safety Interventions:   fall reduction program maintained   family at bedside   lighting adjusted for tasks/safety   low bed  Goal: Optimal Comfort and Wellbeing  Outcome: Progressing  Goal: Readiness for Transition of Care  Outcome: Progressing  Goal: Rounds/Family Conference  Outcome: Progressing     Problem: Self-Care Deficit  Goal: Improved Ability to Complete Activities of Daily Living  Outcome: Progressing     Problem: Nausea and Vomiting (Chemotherapy Effects)  Goal: Fluid and Electrolyte Balance  Outcome: Progressing

## 2022-01-21 NOTE — Unmapped (Signed)
Physician Discharge Summary    Admit date: 01/20/2022    Discharge date and time: January 22, 2022     Discharge to: Home    Discharge Service: Gynecology (GYN)    Discharge Attending Physician: [name], MD    Procedures: s/p C1D1 of EMA-CO    Hospital Course:    Robin Gillespie is a 31 y.o. 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 Her hospital course is outlined by pertinent problem below:    Onc: Gestational Trophoblastic Neoplasm  Patient has a remote history of a molar pregnancy in 2019 followed by an undetectable beta-Hcg for 6 months. She most recently had a term cesarean delivery in 02/2021 with normal pathology. In December of 2022 she was seen for placement of an IUD and was found to have a positive pregnancy test. She underwent D&C on 5/5 with negative pathology. She was noted to have continued elevated beta-hCG to max of 2,739 (7/14). Follow up CT CAP (10/22) noted heterogenous uterus with myometrium up to 5 cm, solid pulmonary nodules in RLL and lingula that was consistent with metastatic disease. Follow up brain MRI was negative for metastatic disease, however it noted incidental nonspecific scatter of foci of T2 (referred to Neurology on 10/4). She was subsequently seen by Dr. Alvester Morin and diagnosed with Gestational Trophoblastic Neoplasm with plan to start chemotherapy of 3 cycles of EMA (D1-2) then outpatient CO (D8). (D1-2) on Fri/Sat's with outpatient CO (D8) on Fridays. She received her first cycle of EMA-CO on 10/13-14 without adverse reaction. She will continue home administration of Neupogen 300 mcg every day x 3 starting 24 hrs after completion of inpatient chemo (D3-5). She was also discharged with her remaining leucovorin (q12h x 4 starting 24hrs after methotrexate dose), as well as Zofran 8mg  PO TID PRN and Compazine 10 mg PO q6h PRN.     Patient was discharged on HD#3. She tolerated this cycle well with no interventions indicated. Her hemoglobin remained stable. She will follow up with Dr. Alvester Morin for further treatment planning and has her next infusion scheduled on 10/20.     Condition at Discharge: stable  Discharge Medications:      Your Medication List        STOP taking these medications      ibuprofen 600 MG tablet  Commonly known as: MOTRIN            START taking these medications      BD LUER-LOK SYRINGE 3 mL 25 x 5/8 Syrg  Generic drug: syringe with needle  Use to inject neupogen at home under the skin     leuCOVorin 15 MG tablet  Commonly known as: WELLCOVORIN  Take 1 tablet (15 mg total) by mouth every twelve (12) hours for 2 doses.     loratadine 10 mg tablet  Commonly known as: CLARITIN  Take 1 tablet (10 mg total) by mouth daily. Start taking 1 day prior to filgrastim injections and continue until 1 day after last injection     ondansetron 4 MG tablet  Commonly known as: ZOFRAN  Take 1 tablet (4 mg total) by mouth every six (6) hours as needed for nausea.     prochlorperazine 10 MG tablet  Commonly known as: COMPAZINE  Take 1 tablet (10 mg total) by mouth every six (6) hours as needed for nausea.            CONTINUE taking  these medications      empty container Misc  Commonly known as: sharps container  Use as directed     LOESTRIN FE 1/20 (28) ORAL  Take 1 tablet by mouth in the morning.     NEUPOGEN 300 mcg/mL injection  Generic drug: filgrastim  Inject 1 mL (300 mcg total) under the skin 24 hours after completion of inpatient chemotherapy. Repeat every 24 hours for 3 days.              Pending Test Results:       Discharge Instructions:   Activity Instructions       Activity as tolerated            Diet Instructions       Discharge diet (specify)      Discharge Nutrition Therapy: Regular          Other Instructions       Call MD for:  difficulty breathing, headache or visual disturbances      Call MD for:  persistent dizziness or light-headedness      Call MD for:  persistent nausea or vomiting      Call MD for:  redness, tenderness, or signs of infection (pain, swelling, redness, odor or green/yellow discharge around incision site)      Call MD for:  severe uncontrolled pain      Call MD for:  temperature >38.5 Celsius (101.3 Fahrenheit)      Discharge instructions      DISCHARGE INSTRUCTIONS    When to Contact us  - Contact MD if you develop temperature >100.4, unresolvable pain, frequent vomiting, or heavy bleeding.   - If you have any questions or concerns, call Clinic at [phone] during normal business hours (Mon-Fri 8-5) and/or call hospital operator [phone] and ask for the GYN On-Call Consult pager for GYN resident on-call.    Follow up  [facility] clinic will contact you within the next few days.  However, if you do not hear from them within 1 week, please call [phone] and arrange your appointment.          Appointments which have been scheduled for you      Jan 27, 2022  3:00 PM  (Arrive by 2:30 PM)  [address] with [employer] CHAIR [address]  [facility] [place] [facility]) [address]  [place] [address]  [phone]                I spent less than 30 minutes in the discharge of this patient.    Scribe's Attestation: Lubertha Basque, MD obtained and performed the history, physical exam and medical decision making elements that were  entered into the chart. Documentation assistance was provided by me personally, a scribe. Signed by Marylin Crosby Scribe, on January 21, 2022 at 7:50 AM.     Leotis Shames Ahlschlager, MD MPH  [facility] Obstetrics and Gynecology, PGY-2    I saw and evaluated the patient, participating in the key portions of the service. I reviewed the resident???s note. I agree with the resident???s findings and plan.     Latrelle Dodrill Gloris Manchester, MD, MS  Gynecologic Oncology  01/22/2022

## 2022-01-21 NOTE — Unmapped (Signed)
Patient stays admitted to receive EMA-CO Cycle 1, Day 2 for Gestational Trophoblastic Neoplasm. Vital signs stable. Denies pain. Appetite fair. No complaints of nausea and without report of vomiting. Patient voids with adequate volume of urine. Patient ambulatory in room and independent with her ADLs. Methotrexate infusion completed this shift.  Port a cath intact and without signs of CLABSI. POC discussed, questions answered and verbalized good understanding.   Problem: Adult Inpatient Plan of Care  Goal: Plan of Care Review  Outcome: Progressing     Problem: Adult Inpatient Plan of Care  Goal: Patient-Specific Goal (Individualized)  Outcome: Progressing     Problem: Adult Inpatient Plan of Care  Goal: Absence of Hospital-Acquired Illness or Injury  Outcome: Progressing     Problem: Adult Inpatient Plan of Care  Goal: Optimal Comfort and Wellbeing  Outcome: Progressing     Problem: Adult Inpatient Plan of Care  Goal: Readiness for Transition of Care  Outcome: Progressing     Problem: Adult Inpatient Plan of Care  Goal: Rounds/Family Conference  Outcome: Progressing     Problem: Nausea and Vomiting (Chemotherapy Effects)  Goal: Fluid and Electrolyte Balance  Outcome: Progressing     Problem: Self-Care Deficit  Goal: Improved Ability to Complete Activities of Daily Living  Outcome: Progressing

## 2022-01-21 NOTE — Unmapped (Signed)
GYN ONC PROGRESS NOTE    Assessment and Plan:  Robin Gillespie is a 31 y.o. 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      Onc: Gestational Trophoblastic Neoplasm  - Remote h/o molar preg 2019 followed to hCG undetectable x 6 months. Most recent preg > term CS 02/2021 (normal placental path)  - 04/07/2021: Px for IUD w/ +UPT; hCG persistently elevated > D&C 5/5 > negative pathology   - Last hCG 9/18: 833 (max 2739 on 7/14) > 1676 on admission 10/13   - 9/22 CT CAP: Heterogenous uterus w/ myometrium up to 5 cm, solid pulm nodules in RLL and lingula c/w metastatic disease  - 10/1 MRI Brain: incidental scatter foci of T2 nonspecific > referred to Neurology on 10/4.   - Strongly desires fertility preservation. Risk factors include GTN remote from term pregnancy.  - Plan for 3 cycles of EMA (D1-2) then outpatient CO (D8). (D1-2) on Fri/Sat's with outpatient CO (D8) on Fridays.   - Plan for home administration of Neupogen 300 mcg every day x 3 starting 24 hrs after completion of inpatient chemo (D3-5). [facility]  - Will plan for discharge with remaining leucovorin (q12h x 4 starting 24hrs after methotrexate dose), as well as Zofran 8mg  PO TID PRN and Compazine 10 mg PO q6h PRN.     Anxiety  - Anxiety can precipitate nausea/vomiting; Ativan and Atarax ordered PRN    PPX:  - Bowel regimen and antiemetics prn  - SCDs, Lovenox, ambulation    Dispo: Home 10/14 PM or 10/15 AM pending chemo completion time    Plan discussed with Dr. Sherlean Foot MD and Attending Dr. Gloris Manchester MD.    Subjective:   Pt feels well this morning, denies nausea. States she slept poorly overnight but improved with melatonin. Admits appetite.     Objective:  Temp:  [36.3 ??C (97.3 ??F)-36.9 ??C (98.4 ??F)] 36.3 ??C (97.3 ??F)  Heart Rate:  [64-81] 64  Resp:  [16-18] 18  BP: (121-130)/(64-78) 121/78  SpO2:  [99 %-100 %] 99 %    Gen: NAD, awake, alert, oriented  CV: regular rate and rhythm   Pulm: clear to auscultation bilaterally, normal work of breathing  Abd: Soft, appropriately tender to palpation, non-distended, bowel sounds present  Skin: Warm, dry, soft without rash    Medications (scheduled)    DACTINomycin  0.5 mg Intravenous Q24H    dexAMETHasone  8 mg Oral Q24H    diphenhydrAMINE  25 mg Oral Q24H    enoxaparin (LOVENOX) injection  40 mg Subcutaneous Q24H    etoposide  100 mg/m2 (Order-Specific) Intravenous Q24H    famotidine  40 mg Oral Q24H    leuCOVorin  15 mg Oral Q12H    methotrexate (Preservative Free) 546 mg in sodium chloride (NS) 0.9 % 1,000 mL IVPB  300 mg/m2 (Order-Specific) Intravenous Once    ondansetron  24 mg Oral Q24H       Medications (prn)  dexAMETHasone, diphenhydrAMINE, EPINEPHrine IM, famotidine (PEPCID) IV, heparin, porcine (PF), hydrOXYzine, IP okay to treat, LORazepam, LORazepam, methylPREDNISolone sodium succinate, ondansetron, ondansetron, prochlorperazine (COMPAZINE) 10 mg in sodium chloride (NS) 0.9 % 25 mL IVPB, prochlorperazine, promethazine, promethazine, sodium chloride, sodium chloride 0.9%    Labs:   Lab Results   Component Value Date    WBC 5.6 01/20/2022    HGB 11.3 01/20/2022    HCT 32.5 (L) 01/20/2022  PLT 255 01/20/2022       Lab Results   Component Value Date    NA 137 01/21/2022    K 4.4 01/21/2022    CL 106 01/21/2022    CO2 26.0 01/21/2022    BUN 7 (L) 01/21/2022    CREATININE 0.92 (H) 01/21/2022    GLU 173 01/21/2022    CALCIUM 8.7 01/21/2022       Lab Results   Component Value Date    BILITOT 0.9 01/20/2022    PROT 7.6 01/20/2022    ALBUMIN 4.0 01/20/2022    ALT 16 01/20/2022    AST 19 01/20/2022    ALKPHOS 69 01/20/2022       No results found for: LABPROT, INR, APTT    Carney Corners, MD PGY-2  Department of Obstetrics and Gynecology  [facility] [facility] [place] [place] [facility]

## 2022-01-21 NOTE — Unmapped (Signed)
Pharmacy: First Cycle Chemotherapy Patient Education  Robin Gillespie is a 31 y.o. year old with GTN who was admitted to [facility] and will receive their first cycle of chemotherapy. Patient education reviewing the chemotherapy is provided to the patient by the oncology pharmacy team.  Patient and partner were present for education.      Chemotherapy regimen/agents: EMA-CO  Day 1 of chemotherapy: 10/13  Central Venous Access Device: port    For each of the following items listed below and where applicable, signs and symptoms of adverse effects as well as self-care management were reviewed. Additionally, chemotherapy scheduling, chemotherapy agents, mechanism of action, exposure risk mitigation for caregivers, drug-drug/food/herbal interactions and supportive care measures were also reviewed with the patient.     Side effects discussed included but were not limited to: neutropenia/febrile neutropenia, thrombocytopenia, anemia, dysgeusia, nausea/vomiting, hypersensitivity reactions, hepatotoxicity, renal toxicity, alopecia, mucositis, bone pain, peripheral neuropathy, and fertility risk/pregnancy precautions.    A/P:  1. The patient verbalized understanding of this information. The patient's drug profile was reviewed for drug interactions with the chemotherapy and no interactions were present at the time of review. The patient was given educational materials that included resources from the [facility] patient education library, HOPA, Lexi-Comp, EPIC, Product/process development scientist.    Approximate time spent with patient: 30 minutes.    Thank you, please feel free to contact with any questions or concerns.    Monte Fantasia, PharmD,  PharmD, BCPS, BCOP  Clinical Pharmacy Specialist, Gynecologic Malignancy

## 2022-01-21 NOTE — Unmapped (Signed)
VSS. Patient arrived as direct admit with fiance at bedside. Port was accessed, labs obtained, IVF initiated as ordered.     Patient currently in bed, call bell within reach. No needs mentioned at this time.     Problem: Adult Inpatient Plan of Care  Goal: Plan of Care Review  Outcome: Ongoing - Unchanged  Goal: Patient-Specific Goal (Individualized)  Outcome: Ongoing - Unchanged  Goal: Absence of Hospital-Acquired Illness or Injury  Outcome: Ongoing - Unchanged  Intervention: Identify and Manage Fall Risk  Recent Flowsheet Documentation  Taken 01/20/2022 1500 by Marton Redwood, RN  Safety Interventions:   fall reduction program maintained   low bed   family at bedside   lighting adjusted for tasks/safety   nonskid shoes/slippers when out of bed  Goal: Optimal Comfort and Wellbeing  Outcome: Ongoing - Unchanged  Goal: Readiness for Transition of Care  Outcome: Ongoing - Unchanged  Goal: Rounds/Family Conference  Outcome: Ongoing - Unchanged     Problem: Self-Care Deficit  Goal: Improved Ability to Complete Activities of Daily Living  Outcome: Ongoing - Unchanged

## 2022-01-22 DIAGNOSIS — O019 Hydatidiform mole, unspecified: Secondary | ICD-10-CM | POA: Diagnosis not present

## 2022-01-22 MED ORDER — LORATADINE 10 MG TABLET
ORAL_TABLET | Freq: Every day | ORAL | 0 refills | 30 days | Status: CP
Start: 2022-01-22 — End: 2023-01-22

## 2022-01-22 MED ORDER — ONDANSETRON HCL 4 MG TABLET
ORAL_TABLET | Freq: Four times a day (QID) | ORAL | 0 refills | 8 days | Status: CP | PRN
Start: 2022-01-22 — End: ?

## 2022-01-22 MED ORDER — PROCHLORPERAZINE MALEATE 10 MG TABLET
ORAL_TABLET | Freq: Four times a day (QID) | ORAL | 0 refills | 8 days | Status: CP | PRN
Start: 2022-01-22 — End: ?

## 2022-01-22 MED ORDER — LEUCOVORIN CALCIUM 15 MG TABLET
ORAL_TABLET | Freq: Two times a day (BID) | ORAL | 0 refills | 1.00000 days | Status: CP
Start: 2022-01-22 — End: 2022-01-22
  Filled 2022-01-22: qty 2, 1d supply, fill #0
  Filled 2022-01-22: qty 1, 1d supply, fill #0

## 2022-01-22 MED ADMIN — heparin, porcine (PF) 100 unit/mL injection 500 Units: 500 [IU] | INTRAVENOUS | @ 05:00:00 | Stop: 2022-01-22

## 2022-01-22 MED ADMIN — dexAMETHasone (DECADRON) tablet 8 mg: 8 mg | ORAL | @ 01:00:00 | Stop: 2022-01-21

## 2022-01-22 MED ADMIN — DACTINomycin (COSMEGEN) syringe: .5 mg | INTRAVENOUS | @ 02:00:00 | Stop: 2022-01-21

## 2022-01-22 MED ADMIN — etoposide (VEPESID) 182 mg in sodium chloride NON-PVC (NS) 0.9 % 500 mL IVPB: 100 mg/m2 | INTRAVENOUS | @ 01:00:00 | Stop: 2022-01-21

## 2022-01-22 MED ADMIN — famotidine (PEPCID) tablet 40 mg: 40 mg | ORAL | Stop: 2022-01-21

## 2022-01-22 MED ADMIN — leuCOVorin (WELLCOVORIN) tablet 15 mg: 15 mg | ORAL | @ 03:00:00 | Stop: 2022-01-23

## 2022-01-22 MED ADMIN — ondansetron (ZOFRAN) tablet 24 mg: 24 mg | ORAL | Stop: 2022-01-21

## 2022-01-22 MED ADMIN — diphenhydrAMINE (BENADRYL) capsule/tablet 25 mg: 25 mg | ORAL | @ 01:00:00 | Stop: 2022-01-21

## 2022-01-22 NOTE — Unmapped (Signed)
VSS. Pt received chemotherapy, tolerated without concerns. Leucovorin given 2238. Pt verbalized understanding of importance of medication adherence at home. Denies pain, nausea, vomiting. Voiding spontaneously. PAC de accessed and heparin flushed. Visitor at bedside. Discharge teaching done with patient. Discharged to home. All needs met and tended to. All questions answered.     Problem: Adult Inpatient Plan of Care  Goal: Plan of Care Review  Outcome: Discharged to Home  Goal: Patient-Specific Goal (Individualized)  Outcome: Discharged to Home  Goal: Absence of Hospital-Acquired Illness or Injury  Outcome: Discharged to Home  Intervention: Identify and Manage Fall Risk  Recent Flowsheet Documentation  Taken 01/21/2022 1930 by Laroy Apple, RN  Safety Interventions:   fall reduction program maintained   lighting adjusted for tasks/safety   low bed   chemotherapeutic agent precautions  Goal: Optimal Comfort and Wellbeing  Outcome: Discharged to Home  Goal: Readiness for Transition of Care  Outcome: Discharged to Home  Goal: Rounds/Family Conference  Outcome: Discharged to Home     Problem: Self-Care Deficit  Goal: Improved Ability to Complete Activities of Daily Living  Outcome: Discharged to Home     Problem: Nausea and Vomiting (Chemotherapy Effects)  Goal: Fluid and Electrolyte Balance  Outcome: Discharged to Home

## 2022-01-27 ENCOUNTER — Ambulatory Visit: Admit: 2022-01-27 | Discharge: 2022-01-28 | Payer: BLUE CROSS/BLUE SHIELD

## 2022-01-27 DIAGNOSIS — O019 Hydatidiform mole, unspecified: Secondary | ICD-10-CM | POA: Diagnosis not present

## 2022-01-27 DIAGNOSIS — Z5111 Encounter for antineoplastic chemotherapy: Secondary | ICD-10-CM | POA: Diagnosis not present

## 2022-01-27 DIAGNOSIS — C58 Malignant neoplasm of placenta: Secondary | ICD-10-CM | POA: Diagnosis not present

## 2022-01-27 DIAGNOSIS — Z01818 Encounter for other preprocedural examination: Principal | ICD-10-CM

## 2022-01-27 LAB — COMPREHENSIVE METABOLIC PANEL
ALBUMIN: 4.3 g/dL (ref 3.4–5.0)
ALKALINE PHOSPHATASE: 82 U/L (ref 46–116)
ALT (SGPT): 33 U/L (ref 10–49)
ANION GAP: 6 mmol/L (ref 5–14)
AST (SGOT): 21 U/L (ref <=34)
BILIRUBIN TOTAL: 0.9 mg/dL (ref 0.3–1.2)
BLOOD UREA NITROGEN: 11 mg/dL (ref 9–23)
BUN / CREAT RATIO: 14
CALCIUM: 9.3 mg/dL (ref 8.7–10.4)
CHLORIDE: 104 mmol/L (ref 98–107)
CO2: 26 mmol/L (ref 20.0–31.0)
CREATININE: 0.76 mg/dL
EGFR CKD-EPI (2021) FEMALE: >90 mL/min/{1.73_m2} (ref >=60)
GLUCOSE RANDOM: 116 mg/dL (ref 70–179)
POTASSIUM: 3.9 mmol/L (ref 3.4–4.8)
PROTEIN TOTAL: 8.1 g/dL (ref 5.7–8.2)
SODIUM: 136 mmol/L (ref 135–145)

## 2022-01-27 LAB — CBC W/ AUTO DIFF
BASOPHILS ABSOLUTE COUNT: 0 10*9/L (ref 0.0–0.1)
BASOPHILS RELATIVE PERCENT: 0.8 %
EOSINOPHILS ABSOLUTE COUNT: 0.2 10*9/L (ref 0.0–0.5)
EOSINOPHILS RELATIVE PERCENT: 5.6 %
HEMATOCRIT: 36.2 % (ref 34.0–44.0)
HEMOGLOBIN: 12.3 g/dL (ref 11.3–14.9)
LYMPHOCYTES ABSOLUTE COUNT: 1.2 10*9/L (ref 1.1–3.6)
LYMPHOCYTES RELATIVE PERCENT: 33.9 %
MEAN CORPUSCULAR HEMOGLOBIN CONC: 33.8 g/dL (ref 32.0–36.0)
MEAN CORPUSCULAR HEMOGLOBIN: 27 pg (ref 25.9–32.4)
MEAN CORPUSCULAR VOLUME: 79.8 fL (ref 77.6–95.7)
MEAN PLATELET VOLUME: 8.6 fL (ref 6.8–10.7)
MONOCYTES ABSOLUTE COUNT: 0.1 10*9/L — ABNORMAL LOW (ref 0.3–0.8)
MONOCYTES RELATIVE PERCENT: 3.1 %
NEUTROPHILS ABSOLUTE COUNT: 2.1 10*9/L (ref 1.8–7.8)
NEUTROPHILS RELATIVE PERCENT: 56.6 %
PLATELET COUNT: 241 10*9/L (ref 150–450)
RED BLOOD CELL COUNT: 4.54 10*12/L (ref 3.95–5.13)
RED CELL DISTRIBUTION WIDTH: 15 % (ref 12.2–15.2)
WBC ADJUSTED: 3.7 10*9/L (ref 3.6–11.2)

## 2022-01-27 LAB — HCG QUANTITATIVE, BLOOD: GONADOTROPIN, CHORIONIC (HCG) QUANT: 435 m[IU]/mL

## 2022-01-27 MED ADMIN — dexAMETHasone (DECADRON) tablet 8 mg: 8 mg | ORAL | @ 19:00:00 | Stop: 2022-01-27

## 2022-01-27 MED ADMIN — vinCRIStine (ONCOVIN) 1.46 mg in sodium chloride (NS) 0.9 % 25 mL IVPB: .8 mg/m2 | INTRAVENOUS | @ 20:00:00 | Stop: 2022-01-27

## 2022-01-27 MED ADMIN — sodium chloride (NS) 0.9 % infusion: 100 mL/h | INTRAVENOUS | @ 20:00:00

## 2022-01-27 MED ADMIN — cycloPHOSphamide (CYTOXAN) 1,092 mg in sodium chloride (NS) 0.9 % 250 mL IVPB: 600 mg/m2 | INTRAVENOUS | @ 21:00:00 | Stop: 2022-01-27

## 2022-01-27 MED ADMIN — ondansetron (ZOFRAN) tablet 24 mg: 24 mg | ORAL | @ 19:00:00 | Stop: 2022-01-27

## 2022-01-27 NOTE — Unmapped (Unsigned)
Patient arrived for port access and lab draw. Labs collected and sent for analysis. Patient tolerated well, discharged to lab waiting area.

## 2022-01-27 NOTE — Unmapped (Signed)
It was a pleasure to see you in clinic today.  - Let us know if you develop any mouth sores.  - Return visit planned for 2 weeks.    Thank you very much for allowing me to provide care for you today.  I appreciate your confidence in choosing our Gynecologic Oncology team at [facility].  If you have any questions about your visit today please call our office or send [place] a MyChart message and we will get back to you as soon as possible.

## 2022-01-27 NOTE — Unmapped (Signed)
Patient was screened high risk for falls, the following steps were taken to reduce the risk of falls:    - Patient oriented to environment  - Patient advised to wait to climb onto exam/procedure table/chair until clinic staff could assist  - Physical environment was clear of slip/trip hazards  - Footwear observed to ensure it was appropriate

## 2022-01-27 NOTE — Unmapped (Signed)
Gynecologic Oncology Return Clinic Visit    Date of Service: 01/27/2022    Assessment & Plan:  Robin Gillespie is a 31 y.o. woman with at least stage III:5 (possible III:7) GTD who presents for cycle 1 day 8 of EMA-CO.    Chemo: Labs reviewed and within normal limits.  Great response with beta-hCG.  Okay to proceed with treatment today.  We will go ahead and get patient scheduled for her next inpatient chemo in 1 week.  Reviewed that we will likely treat 2 cycles past negative beta-hCG.    Patient aware to let us know if she develops more mouth pain or lesions.  We can treat with Magic mouthwash as needed.  No mucositis or lesions on exam today.    Patient will continue on OCPs.    RTC 2 weeks with next outpatient chemo.    [name], MD  Gynecologic Oncology      Medical Decision Making  I have independently reviewed notes from prior visits, results from labs and imaging. The current plan of care requires intensive monitoring for toxicity due to use of anti-neoplastic treatments. Medical decision making was of high complexity due to ongoing risk of morbidity/mortality from chemotherapy treatments.      -----------------------  Reason for Visit: Chemo monitoring    Treatment History:  Patient with a remote history of molar pregnancy in 2019 followed by undetectable hCG x6 months.  Most recent term pregnancy delivered via C-section in 02/2021.  Normal placental pathology.  Following delivery, patient was scheduled for IUD placement and was noted to have a positive urine pregnancy test 04/07/2021.  Due to persistently positive hCG, she underwent a D&C in May with negative pathology.  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  Brain MRI negative for metastases.    Interval History:  Patient underwent cycle 1, days 1 through 2, EMA, last week inpatient without issue.  Patient was able to take Neupogen at home without issue.  She does note a little bit of ear pain and bone aching following administration.  She took loratadine and Motrin, and she feels this helped some.  She denies any vaginal bleeding, nausea, vomiting, mouth sores.  She does note some mouth discomfort.      Past Medical/Surgical History:  Past Medical History:   Diagnosis Date    Gestational trophoblastic disease        Past Surgical History:   Procedure Laterality Date    CESAREAN SECTION      IR INSERT PORT AGE GREATER THAN 5 YRS  01/12/2022    IR INSERT PORT AGE GREATER THAN 5 YRS 01/12/2022 [name], MD IMG [facility]       No family history on file.    Social History     Socioeconomic History    Marital status: Single   Tobacco Use    Smoking status: Never    Smokeless tobacco: Never       Current Medications:    Current Outpatient Medications:     empty container (SHARPS CONTAINER) Misc, Use as directed, Disp: 1 each, Rfl: 2    filgrastim (NEUPOGEN) 300 mcg/mL injection, Inject 1 mL (300 mcg total) under the skin 24 hours after completion of inpatient chemotherapy. Repeat every 24 hours for 3 days., Disp: 6 mL, Rfl: 3    loratadine (CLARITIN) 10 mg tablet, Take 1 tablet (10 mg total) by mouth daily. Start taking 1 day prior  to filgrastim injections and continue until 1 day after last injection, Disp: 30 tablet, Rfl: 0    norethindrone-e.estradiol-iron (LOESTRIN FE 1/20, 28, ORAL), Take 1 tablet by mouth in the morning., Disp: , Rfl:     ondansetron (ZOFRAN) 4 MG tablet, Take 1 tablet (4 mg total) by mouth every six (6) hours as needed for nausea., Disp: 30 tablet, Rfl: 0    prochlorperazine (COMPAZINE) 10 MG tablet, Take 1 tablet (10 mg total) by mouth every six (6) hours as needed for nausea., Disp: 30 tablet, Rfl: 0    syringe with needle (BD LUER-LOK SYRINGE) 3 mL 25 x 5/8 Syrg, Use to inject neupogen at home under the skin, Disp: 3 each, Rfl: 0  No current facility-administered medications for this visit.    Facility-Administered Medications Ordered in Other Visits:     OKAY TO SEND MEDICATION/CHEMOTHERAPY TO OUTPATIENT UNIT, , Other, Once, [name], MD    sodium chloride (NS) 0.9 % infusion, 100 mL/hr, Intravenous, Continuous, [name], MD, Stopped at 01/27/22 1715    Review of Symptoms:  Complete 10-system review is negative except as above in Interval History.    Physical Exam:  BP 149/79  - Pulse 75  - Temp 36.3 ??C (97.3 ??F) (Temporal)  - Resp 20  - Wt 78.1 kg (172 lb 1.6 oz)  - SpO2 100%  - BMI 34.18 kg/m??   General: Alert, oriented, no acute distress.  HEENT: Normocephalic, atraumatic. Neck symmetric without masses. Sclera anicteric. Posterior oropharynx clear.  No mucositis or lesions.  Chest: Normal work of breathing. Clear to auscultation bilaterally.  Port site clean.  Cardiovascular: Regular rate and rhythm, no murmurs.  Abdomen: Soft, nontender.  Normoactive bowel sounds.  No masses or hepatosplenomegaly appreciated.   Extremities: Grossly normal range of motion.  Warm, well perfused.  No edema bilaterally.  Skin: No rashes or lesions noted.    Laboratory & Radiologic Studies:  Lab Results   Component Value Date    WBC 3.7 01/27/2022    HGB 12.3 01/27/2022    HCT 36.2 01/27/2022    PLT 241 01/27/2022    NEUTROABS 2.1 01/27/2022     Lab Results   Component Value Date    NA 136 01/27/2022    K 3.9 01/27/2022    CL 104 01/27/2022    CO2 26.0 01/27/2022    ANIONGAP 6 01/27/2022    BUN 11 01/27/2022    CREATININE 0.76 01/27/2022    BCR 14 01/27/2022    GLU 116 01/27/2022    CALCIUM 9.3 01/27/2022    ALBUMIN 4.3 01/27/2022    PROT 8.1 01/27/2022    BILITOT 0.9 01/27/2022    AST 21 01/27/2022    ALT 33 01/27/2022    ALKPHOS 82 01/27/2022     Lab Results   Component Value Date    HCGQUANT 435.0 01/27/2022    HCGQUANT 1,676.2 01/20/2022

## 2022-02-02 NOTE — Unmapped (Cosign Needed)
Gyn Oncology Admission History and Physical     Assessment/Plan:    Robin Gillespie is a 31 y.o. 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     Onc: Gestational Trophoblastic Neoplasm  - Remote h/o molar preg 2019 followed to hCG undetectable x 6 months. Most recent preg > term CS 02/2021 (normal placental path)  - 04/07/2021: Px for IUD w/ +UPT; hCG persistently elevated > D&C 5/5 > negative pathology   - Last hCG 9/18: 833 (max 2739 on 7/14) > hCG ordered with admit labs  - 9/22 CT CAP: Heterogenous uterus w/ myometrium up to 5 cm, solid pulm nodules in RLL and lingula c/w metastatic disease  - 10/1 MRI Brain: incidental scatter foci of T2 nonspecific > referred to Neurology on 10/4.   - Strongly desires fertility preservation. Risk factors include GTN remote from term pregnancy.  - Plan for 3 cycles of EMA (D1-2) then outpatient CO (D8)  (D1-2) on Fri/Sat's with outpatient CO (D8) on Fridays.   - Plan for home administration of Neupogen 300 mcg every day x 3 starting 24 hrs after completion of inpatient chemo (D3-5). [facility]. Patient will need additional needles and syringes for her Neupogen.  - Will plan for discharge with remaining leucovorin (q12h x 4 starting 24hrs after methotrexate dose), as well as Zofran 8mg  PO TID PRN and Compazine 10 mg PO q6h PRN.     Anxiety  - Anxiety can precipitate nausea/vomiting; Ativan and Atarax ordered PRN    PPx: SCDs, ambulation, IS, Lovenox ordered for    Discussed w/ Dr. Hubert Azure and attending Dr. Gloris [place], who was in agreement with this plan.    Chief Complaint: EMA-CO chemotherapy    History of Present Illness:   Robin Gillespie is a 31 y.o. 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 She will be undergoing her second round of chemotherapy during this admission.     The patient has a history of molar pregnancy, confirmed on pathology on 05/07/2017 following D&C. Her beta quant was followed until reaching zero, and then subsequently followed every month for 6 months. Since this event, the patient has had two term pregnancies with most recent pregnancy ending in term delivery via cesarean section in 02/2021 and negative placental pathology.     She presented for IUD placement in January 2023 and had a positive beta hCG with continued rise to a maximum value of 2739 on 10/21/2021, most recently down trended to 742 on 12/09/2021 without intervention. She had a dilation and curettage in May with negative pathology and negative in office ultrasound. Following this procedure, she was started on Loestrin birth control pills, and has only had one period in June 2023. Given this persistent detectable hCG for over 6 months, she underwent a work up with CT Chest/Abdomen/Pelvis which showed heterogeneous appearance of the uterus and solid pulmonary nodules in the right lower lobe and lingula. MRI Brain did not show evidence of metastatic disease but did show a small amount of nonspecific scattered foci of T2 hyperintensity within the white matter of the cerebral hemispheres more pronounced than expected for age. A plan was made for chemotherapy.  She received Cycle 1 of her chemotherapy on 01/20/22 followed by the outpatient portion of her chemotherapy on 01/27/22. She tolerated chemotherapy well and reported that her only side effect was a spicy sensation in her throat, but this disappeared shortly after. Patient reports that she did not have enough syringe and vials, so she did not take her Neupogen after her outpatient chemotherapy.       Oncology History    No history exists.       Active Problems:    * No active hospital problems. *      Past Medical History:   Diagnosis Date    Gestational trophoblastic disease          Past Surgical History:   Procedure Laterality Date    CESAREAN SECTION      IR INSERT PORT AGE GREATER THAN 5 YRS  01/12/2022    IR INSERT PORT AGE GREATER THAN 5 YRS 01/12/2022 [name], MD [facility]       OB History   No obstetric history on file.       Social History     Socioeconomic History    Marital status: Single   Tobacco Use    Smoking status: Never    Smokeless tobacco: Never       No family history on file.      Review of systems: Review of Systems - Negative except anxiety and pain at port site s/p recent port placement.      Labs: Prechemo labs were drawn on 10/13 and are pending.   All lab results last 24 hours:    No results found for this or any previous visit (from the past 24 hour(s)).      PE:   There were no vitals taken for this visit.    -General:   Well-appearing in NAD.  -Lymph:   No marked lymphadenopathy.  -CV:    RRR. No m/r/g.  -Pulm:   CTAB. Good air movement. Normal WOB.  -Abd:    Non-tender, non-distended abdomen. No guarding, no rebound. Normal bowel sounds.   -Extremities:   No clubbing, erythema, or edema.   -Psych:   Appropriate.The patient is awake and alert.  -GU:    Deferred.

## 2022-02-03 ENCOUNTER — Ambulatory Visit
Admit: 2022-02-03 | Discharge: 2022-02-05 | Disposition: A | Discharge status: Home / Self Care | Payer: BLUE CROSS/BLUE SHIELD

## 2022-02-03 LAB — URINALYSIS WITH MICROSCOPY
BACTERIA: NONE SEEN /HPF
BILIRUBIN UA: NEGATIVE
BLOOD UA: NEGATIVE
GLUCOSE UA: NEGATIVE
KETONES UA: NEGATIVE
LEUKOCYTE ESTERASE UA: NEGATIVE
NITRITE UA: NEGATIVE
PH UA: 8 (ref 5.0–9.0)
PROTEIN UA: NEGATIVE
RBC UA: 1 /HPF (ref <=4)
SPECIFIC GRAVITY UA: 1.011 (ref 1.003–1.030)
SQUAMOUS EPITHELIAL: 2 /HPF (ref 0–5)
UROBILINOGEN UA: <2
WBC UA: <1 /HPF (ref 0–5)

## 2022-02-03 LAB — CBC W/ AUTO DIFF
BASOPHILS ABSOLUTE COUNT: 0 10*9/L (ref 0.0–0.1)
BASOPHILS RELATIVE PERCENT: 2.1 %
EOSINOPHILS ABSOLUTE COUNT: 0 10*9/L (ref 0.0–0.5)
EOSINOPHILS RELATIVE PERCENT: 1.8 %
HEMATOCRIT: 32.4 % — ABNORMAL LOW (ref 34.0–44.0)
HEMOGLOBIN: 11 g/dL — ABNORMAL LOW (ref 11.3–14.9)
LYMPHOCYTES ABSOLUTE COUNT: 1 10*9/L — ABNORMAL LOW (ref 1.1–3.6)
LYMPHOCYTES RELATIVE PERCENT: 49.2 %
MEAN CORPUSCULAR HEMOGLOBIN CONC: 33.8 g/dL (ref 32.0–36.0)
MEAN CORPUSCULAR HEMOGLOBIN: 27 pg (ref 25.9–32.4)
MEAN CORPUSCULAR VOLUME: 79.8 fL (ref 77.6–95.7)
MEAN PLATELET VOLUME: 8.4 fL (ref 6.8–10.7)
MONOCYTES ABSOLUTE COUNT: 0.2 10*9/L — ABNORMAL LOW (ref 0.3–0.8)
MONOCYTES RELATIVE PERCENT: 11 %
NEUTROPHILS ABSOLUTE COUNT: 0.7 10*9/L — ABNORMAL LOW (ref 1.8–7.8)
NEUTROPHILS RELATIVE PERCENT: 35.9 %
PLATELET COUNT: 325 10*9/L (ref 150–450)
RED BLOOD CELL COUNT: 4.06 10*12/L (ref 3.95–5.13)
RED CELL DISTRIBUTION WIDTH: 14.7 % (ref 12.2–15.2)
WBC ADJUSTED: 2 10*9/L — ABNORMAL LOW (ref 3.6–11.2)

## 2022-02-03 LAB — COMPREHENSIVE METABOLIC PANEL
ALBUMIN: 4.1 g/dL (ref 3.4–5.0)
ALKALINE PHOSPHATASE: 67 U/L (ref 46–116)
ALT (SGPT): 30 U/L (ref 10–49)
ANION GAP: 6 mmol/L (ref 5–14)
AST (SGOT): 23 U/L (ref <=34)
BILIRUBIN TOTAL: 0.9 mg/dL (ref 0.3–1.2)
BLOOD UREA NITROGEN: 7 mg/dL — ABNORMAL LOW (ref 9–23)
BUN / CREAT RATIO: 10
CALCIUM: 9.4 mg/dL (ref 8.7–10.4)
CHLORIDE: 106 mmol/L (ref 98–107)
CO2: 26 mmol/L (ref 20.0–31.0)
CREATININE: 0.72 mg/dL
EGFR CKD-EPI (2021) FEMALE: >90 mL/min/{1.73_m2} (ref >=60)
GLUCOSE RANDOM: 101 mg/dL (ref 70–179)
POTASSIUM: 3.6 mmol/L (ref 3.4–4.8)
PROTEIN TOTAL: 7.7 g/dL (ref 5.7–8.2)
SODIUM: 138 mmol/L (ref 135–145)

## 2022-02-03 LAB — HCG QUANTITATIVE, BLOOD: GONADOTROPIN, CHORIONIC (HCG) QUANT: 23.8 m[IU]/mL

## 2022-02-03 MED ORDER — FILGRASTIM 300 MCG/ML INJECTION VIAL
3 refills | 0 days | Status: CN
Start: 2022-02-03 — End: ?

## 2022-02-03 MED ORDER — BD LUER-LOK SYRINGE 3 ML 25 X 5/8"
Freq: Two times a day (BID) | 0 refills | 6.00000 days | Status: CN | PRN
Start: 2022-02-03 — End: ?

## 2022-02-03 MED ORDER — SYRINGE WITH NEEDLE 3 ML 25 X 5/8"
0 refills | 0 days
Start: 2022-02-03 — End: ?

## 2022-02-03 MED ADMIN — sodium bicarbonate 100 mEq in dextrose 5 % 1,100 mL continuous infusion: 250 mL/h | INTRAVENOUS | @ 20:00:00 | Stop: 2022-02-03

## 2022-02-03 NOTE — Unmapped (Addendum)
Robin Gillespie is a 31 y.o. 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 Her hospital course is outlined by pertinent problem below:    Onc: Gestational Trophoblastic Neoplasm  Patient has a remote history of a molar pregnancy in 2019 followed by an undetectable beta-Hcg for 6 months. She most recently had a term cesarean delivery in 02/2021 with normal pathology. In December of 2022 she was seen for placement of an IUD and was found to have a positive pregnancy test. She underwent D&C on 5/5 with negative pathology. She was noted to have continued elevated beta-hCG to max of 2,739 (7/14). Follow up CT CAP (10/22) noted heterogenous uterus with myometrium up to 5 cm, solid pulmonary nodules in RLL and lingula that was consistent with metastatic disease. Follow up brain MRI was negative for metastatic disease, however it noted incidental nonspecific scatter of foci of T2 (referred to Neurology on 10/4). She was subsequently seen by Dr. Alvester Morin and diagnosed with Gestational Trophoblastic Neoplasm with plan to start chemotherapy of 3 cycles of EMA (D1-2) then outpatient CO (D8). (D1-2) on Fri/Sat's with outpatient CO (D8) on Fridays. She received her first cycle of EMA-CO on 10/13-14 without adverse reaction. Her second cycle this admissed was complicated by an ANC of 0.7 due to patient not receiving Neupogen after outpatient chemo during cycle 1. Chemo was administered without complication. She will continue home administration of Neupogen 300 mcg every day x 3 starting 24 hrs after completion of inpatient chemo (D3-5). She was also discharged with her remaining leucovorin (q12h x 4 starting 24hrs after methotrexate dose), as well as Zofran 8mg  PO TID PRN and Compazine 10 mg PO q6h PRN.     Patient was discharged on HD#3. She tolerated this cycle well with no interventions indicated. Her hemoglobin remained stable. She will follow up with Dr. Alvester Morin for further treatment planning and has her next infusion scheduled on 02/10/2022.

## 2022-02-03 NOTE — Unmapped (Signed)
Patient was screened for tobacco use disorder and denies current use.

## 2022-02-03 NOTE — Unmapped (Signed)
Error

## 2022-02-04 LAB — BASIC METABOLIC PANEL
ANION GAP: 8 mmol/L (ref 5–14)
BLOOD UREA NITROGEN: 6 mg/dL — ABNORMAL LOW (ref 9–23)
BUN / CREAT RATIO: 8
CALCIUM: 9.1 mg/dL (ref 8.7–10.4)
CHLORIDE: 108 mmol/L — ABNORMAL HIGH (ref 98–107)
CO2: 24 mmol/L (ref 20.0–31.0)
CREATININE: 0.72 mg/dL
EGFR CKD-EPI (2021) FEMALE: >90 mL/min/{1.73_m2} (ref >=60)
GLUCOSE RANDOM: 254 mg/dL — ABNORMAL HIGH (ref 70–179)
POTASSIUM: 3.7 mmol/L (ref 3.4–4.8)
SODIUM: 140 mmol/L (ref 135–145)

## 2022-02-04 LAB — CBC
HEMATOCRIT: 31.9 % — ABNORMAL LOW (ref 34.0–44.0)
HEMOGLOBIN: 10.8 g/dL — ABNORMAL LOW (ref 11.3–14.9)
MEAN CORPUSCULAR HEMOGLOBIN CONC: 34 g/dL (ref 32.0–36.0)
MEAN CORPUSCULAR HEMOGLOBIN: 27.3 pg (ref 25.9–32.4)
MEAN CORPUSCULAR VOLUME: 80.3 fL (ref 77.6–95.7)
MEAN PLATELET VOLUME: 8.5 fL (ref 6.8–10.7)
PLATELET COUNT: 341 10*9/L (ref 150–450)
RED BLOOD CELL COUNT: 3.97 10*12/L (ref 3.95–5.13)
RED CELL DISTRIBUTION WIDTH: 14.6 % (ref 12.2–15.2)
WBC ADJUSTED: 2.9 10*9/L — ABNORMAL LOW (ref 3.6–11.2)

## 2022-02-04 LAB — MAGNESIUM: MAGNESIUM: 1.3 mg/dL — ABNORMAL LOW (ref 1.6–2.6)

## 2022-02-04 LAB — URINALYSIS WITH MICROSCOPY
BACTERIA: NONE SEEN /HPF
BILIRUBIN UA: NEGATIVE
BLOOD UA: NEGATIVE
GLUCOSE UA: NEGATIVE
KETONES UA: NEGATIVE
LEUKOCYTE ESTERASE UA: NEGATIVE
NITRITE UA: NEGATIVE
PH UA: 7.5 (ref 5.0–9.0)
PROTEIN UA: NEGATIVE
RBC UA: <1 /HPF (ref <=4)
SPECIFIC GRAVITY UA: 1.007 (ref 1.003–1.030)
SQUAMOUS EPITHELIAL: <1 /HPF (ref 0–5)
UROBILINOGEN UA: <2
WBC UA: <1 /HPF (ref 0–5)

## 2022-02-04 LAB — PHOSPHORUS: PHOSPHORUS: 1 mg/dL — ABNORMAL LOW (ref 2.4–5.1)

## 2022-02-04 MED ADMIN — ondansetron (ZOFRAN) tablet 24 mg: 24 mg | ORAL | Stop: 2022-02-05

## 2022-02-04 MED ADMIN — sodium bicarbonate 100 mEq in dextrose 5 % 1,100 mL continuous infusion: 150 mL/h | INTRAVENOUS | @ 17:00:00

## 2022-02-04 MED ADMIN — methotrexate (Preservative Free) 546 mg in sodium chloride (NS) 0.9 % 1,000 mL IVPB: 300 mg/m2 | INTRAVENOUS | @ 02:00:00

## 2022-02-04 MED ADMIN — enoxaparin (LOVENOX) syringe 40 mg: 40 mg | SUBCUTANEOUS | @ 05:00:00

## 2022-02-04 MED ADMIN — norethindrone-e.estradioL-iron 1 tablet **Patient Supplied Medication**: 1 | ORAL | @ 17:00:00

## 2022-02-04 MED ADMIN — diphenhydrAMINE (BENADRYL) capsule/tablet 25 mg: 25 mg | ORAL | Stop: 2022-02-05

## 2022-02-04 MED ADMIN — etoposide (VEPESID) 182 mg in sodium chloride NON-PVC (NS) 0.9 % 500 mL IVPB: 100 mg/m2 | INTRAVENOUS | @ 01:00:00 | Stop: 2022-02-05

## 2022-02-04 MED ADMIN — sodium bicarbonate 100 mEq in dextrose 5 % 1,100 mL continuous infusion: 150 mL/h | INTRAVENOUS

## 2022-02-04 MED ADMIN — sodium bicarbonate 100 mEq in dextrose 5 % 1,100 mL continuous infusion: 150 mL/h | INTRAVENOUS | @ 09:00:00

## 2022-02-04 MED ADMIN — dexAMETHasone (DECADRON) tablet 12 mg: 12 mg | ORAL | Stop: 2022-02-03

## 2022-02-04 MED ADMIN — fosaprepitant (EMEND) 150 mg in sodium chloride (NS) 0.9 % 100 mL IVPB: 150 mg | INTRAVENOUS | Stop: 2022-02-03

## 2022-02-04 MED ADMIN — DACTINomycin (COSMEGEN) syringe: .5 mg | INTRAVENOUS | @ 02:00:00 | Stop: 2022-02-05

## 2022-02-04 MED ADMIN — famotidine (PEPCID) tablet 40 mg: 40 mg | ORAL | Stop: 2022-02-05

## 2022-02-04 NOTE — Unmapped (Signed)
Patient stays admitted to receive low dose EMA-CO for Gestational Trophoblastic Neoplasm. Methotrexate IV  completed at 1009. Sodium Bicarbonate continue to infuse at ordered rate. No acute change. Vital signs WNL. Denies pain. No complaints of nausea. Tolerates diet each meal. Voids in bathroom without concern. Up ad lib and independent of her hygiene. Plan of care discussed, inquiry answered and verbalized good understanding.   Problem: Adult Inpatient Plan of Care  Goal: Plan of Care Review  Outcome: Progressing     Problem: Adult Inpatient Plan of Care  Goal: Patient-Specific Goal (Individualized)  Outcome: Progressing     Problem: Adult Inpatient Plan of Care  Goal: Absence of Hospital-Acquired Illness or Injury  Outcome: Progressing     Problem: Adult Inpatient Plan of Care  Goal: Absence of Hospital-Acquired Illness or Injury  Intervention: Identify and Manage Fall Risk  Recent Flowsheet Documentation  Taken 02/04/2022 0702 by Maren Beach, RN  Safety Interventions: low bed     Problem: Adult Inpatient Plan of Care  Goal: Optimal Comfort and Wellbeing  Outcome: Progressing     Problem: Adult Inpatient Plan of Care  Goal: Readiness for Transition of Care  Outcome: Progressing     Problem: Nausea and Vomiting (Chemotherapy Effects)  Goal: Fluid and Electrolyte Balance  Outcome: Progressing     Problem: Neurotoxicity (Chemotherapy Effects)  Goal: Neurotoxicity Symptom Control  Outcome: Progressing     Problem: Neutropenia (Chemotherapy Effects)  Goal: Absence of Infection  Outcome: Progressing     Problem: Oral Mucositis (Chemotherapy Effects)  Goal: Improved Oral Mucous Membrane Integrity  Outcome: Progressing

## 2022-02-04 NOTE — Unmapped (Signed)
Vitals stable and patient remains afebrile. Patient with no complaints overnight. Urine pH resulted at 8.0 and chemotherapy infusion started. Patient tolerated day 1 of cycle 2 without issues. 12 hour methotrexate and bicarb are infusing now via port a cath without issues. Daily urinalysis/pH to be collected this AM. Patient tolerating diet without complaints. Voiding adequately and passing flatus. No falls or injuries have occurred. Patient remains on neutropenic precautions for ANC of 0.7. Patient updated on plan of care and is without questions.   Problem: Adult Inpatient Plan of Care  Goal: Plan of Care Review  Outcome: Ongoing - Unchanged  Goal: Patient-Specific Goal (Individualized)  Outcome: Ongoing - Unchanged  Goal: Absence of Hospital-Acquired Illness or Injury  Outcome: Ongoing - Unchanged  Intervention: Identify and Manage Fall Risk  Recent Flowsheet Documentation  Taken 02/03/2022 1905 by Wells Guiles, RN  Safety Interventions:   chemotherapeutic agent precautions   fall reduction program maintained   family at bedside   lighting adjusted for tasks/safety   low bed   nonskid shoes/slippers when out of bed   room near unit station  Intervention: Prevent and Manage VTE (Venous Thromboembolism) Risk  Recent Flowsheet Documentation  Taken 02/03/2022 1905 by Wells Guiles, RN  Activity Management: up ad lib  Goal: Optimal Comfort and Wellbeing  Outcome: Ongoing - Unchanged  Goal: Readiness for Transition of Care  Outcome: Ongoing - Unchanged  Goal: Rounds/Family Conference  Outcome: Ongoing - Unchanged

## 2022-02-04 NOTE — Unmapped (Cosign Needed)
Physician Discharge Summary    Admit date: 02/03/2022    Discharge date and time: 02/05/2022    Discharge to: Home    Discharge Service: Gynecology (GYN)    Discharge Attending Physician: [name], MD    Discharge Diagnoses: at least stage III:5 gestational trophoblastic neoplasm     Procedures: None    Pertinent Test Results: None    Hospital Course:    Robin Gillespie is a 31 y.o. 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 Her hospital course is outlined by pertinent problem below:    Onc: Gestational Trophoblastic Neoplasm  Patient has a remote history of a molar pregnancy in 2019 followed by an undetectable beta-Hcg for 6 months. She most recently had a term cesarean delivery in 02/2021 with normal pathology. In December of 2022 she was seen for placement of an IUD and was found to have a positive pregnancy test. She underwent D&C on 5/5 with negative pathology. She was noted to have continued elevated beta-hCG to max of 2,739 (7/14). Follow up CT CAP (10/22) noted heterogenous uterus with myometrium up to 5 cm, solid pulmonary nodules in RLL and lingula that was consistent with metastatic disease. Follow up brain MRI was negative for metastatic disease, however it noted incidental nonspecific scatter of foci of T2 (referred to Neurology on 10/4). She was subsequently seen by Dr. Alvester Morin and diagnosed with Gestational Trophoblastic Neoplasm with plan to start chemotherapy of 3 cycles of EMA (D1-2) then outpatient CO (D8). (D1-2) on Fri/Sat's with outpatient CO (D8) on Fridays. She received her first cycle of EMA-CO on 10/13-14 without adverse reaction. Her second cycle this admissed was complicated by an ANC of 0.7 due to patient not receiving Neupogen after outpatient chemo during cycle 1. Chemo was administered without complication. She will continue home administration of Neupogen 300 mcg every day x 3 starting 24 hrs after completion of inpatient chemo (D3-5). She was also discharged with her remaining leucovorin (q12h x 4 starting 24hrs after methotrexate dose), as well as Zofran 8mg  PO TID PRN and Compazine 10 mg PO q6h PRN.     Patient was discharged on HD#3. She tolerated this cycle well with no interventions indicated. Her hemoglobin remained stable. She will follow up with Dr. Alvester Morin for further treatment planning and has her next infusion scheduled on 02/10/2022.     Condition at Discharge: stable  Discharge Medications:      Your Medication List        CHANGE how you take these medications      BD LUER-LOK SYRINGE 3 mL 25 x 5/8 Syrg  Generic drug: syringe with needle  Use to inject neupogen at home under the skin  What changed: Another medication with the same name was added. Make sure you understand how and when to take each.     syringe with needle 3 mL 25 x 5/8 Syrg  Use to inject Neupogen at home under the skin.  What changed: You were already taking a medication with the same name, and this prescription was added. Make sure you understand how and when to take each.     filgrastim 300 mcg/mL injection  Commonly known as: NEUPOGEN  Inject 1 mL (300 mcg total) under the skin 24 hours after completion chemotherapy. Repeat every 24 hours for 3 days.  What changed:   how much to take  how to take this  when to take  this  additional instructions     leuCOVorin 15 MG tablet  Commonly known as: WELLCOVORIN  Take 1 tablet (15 mg total) by mouth every twelve (12) hours for 3 doses.  What changed: when to take this     loratadine 10 mg tablet  Commonly known as: CLARITIN  Take 1 tablet (10 mg total) by mouth daily. Start taking 1 day prior to filgrastim injections and continue until 1 day after last injection  What changed: Another medication with the same name was added. Make sure you understand how and when to take each.     loratadine 10 mg tablet  Commonly known as: CLARITIN  Take 1 tablet (10 mg total) by mouth daily.  What changed: You were already taking a medication with the same name, and this prescription was added. Make sure you understand how and when to take each.     ondansetron 4 MG tablet  Commonly known as: ZOFRAN  Take 2 tablets (8 mg total) by mouth every eight (8) hours as needed (breakthrough nausea or vomiting unrelieved by prochlorperazine or promethazine) for up to 7 days.  What changed:   how much to take  when to take this  reasons to take this     prochlorperazine 10 MG tablet  Commonly known as: COMPAZINE  Take 1 tablet (10 mg total) by mouth every six (6) hours as needed (breakthrough nausea or vomiting) for up to 8 days.  What changed: reasons to take this            CONTINUE taking these medications      empty container Misc  Commonly known as: sharps container  Use as directed     LOESTRIN FE 1/20 (28) ORAL  Take 1 tablet by mouth in the morning.              Pending Test Results:       Discharge Instructions:   Activity Instructions       Activity as tolerated            Diet Instructions       Discharge diet (specify)      Discharge Nutrition Therapy: Regular          Other Instructions       Call MD for:  difficulty breathing, headache or visual disturbances      Call MD for:  persistent dizziness or light-headedness      Call MD for:  persistent nausea or vomiting      Call MD for:  redness, tenderness, or signs of infection (pain, swelling, redness, odor or green/yellow discharge around incision site)      Call MD for:  severe uncontrolled pain      Call MD for:  temperature >38.5 Celsius (101.3 Fahrenheit)      Discharge instructions      DISCHARGE INSTRUCTIONS    When to Contact us  - Contact MD if you develop temperature >100.4, unresolvable pain, frequent vomiting, or heavy bleeding.   - If you have any questions or concerns, call Clinic at [phone] during normal business hours (Mon-Fri 8-5) and/or call hospital operator [phone] and ask for the GYN On-Call Consult pager for GYN resident on-call.    Follow up  You will need a follow up with Dr. Alvester Morin on 02/10/22.        Follow Up instructions and Outpatient Referrals     Call MD for:  difficulty breathing, headache or visual disturbances  Call MD for:  persistent dizziness or light-headedness      Call MD for:  persistent nausea or vomiting      Call MD for:  redness, tenderness, or signs of infection (pain, swelling,   redness, odor or green/yellow discharge around incision site)      Call MD for:  severe uncontrolled pain      Call MD for:  temperature >38.5 Celsius (101.3 Fahrenheit)      Discharge instructions      CBC and differential      Release to patient: Immediate    Comprehensive Metabolic Panel      Is this a fasting order?: No    Release to patient: Immediate     CMP contains the following tests: NA, K, CL, CO2, BUN, CR, GLUC, CA,   Albumin, Total Protein, Total Bilirubin, AST, ALT, and Alkaline   Phosphatase.     hCG, Quantitative, Pregnancy      Release to patient: Immediate      Appointments which have been scheduled for you      Feb 10, 2022  1:15 PM  (Arrive by 12:45 PM)  NURSE LAB DRAW with ADULT ONC LAB  [facility] ADULT ONCOLOGY LAB DRAW STATION [place] [facility] REGION) [address]  [place] [address]  [phone]        Feb 10, 2022  1:45 PM  (Arrive by 1:30 PM)  RETURN FOLLOW UP [place] with [name], MD  [facility] OBGYN GYN ONCOLOGY 1ST FLR [facility] REGION) [address]  [place] [address]  [phone]        Feb 10, 2022  3:00 PM  (Arrive by 2:30 PM)  LEVEL 180 with [employer] CHAIR 56  [place] ONCOLOGY INFUSION [place] [facility] REGION) [address]  [place] [address]  [phone]        Feb 24, 2022  8:15 AM  (Arrive by 7:45 AM)  NURSE LAB DRAW with ADULT ONC LAB  [facility] ADULT ONCOLOGY LAB DRAW STATION [place] [facility] REGION) [address]  [place] [address]  [phone]        Feb 24, 2022  9:15 AM  (Arrive by 8:45 AM)  LEVEL 180 with [employer] CHAIR 04  [place] ONCOLOGY INFUSION [facility] REGION) [address] [place] [address]  [place] [address]  [phone]                I spent greater than 30 minutes in the discharge of this patient.    Scribe's Attestation: Everette Rank , MD obtained and performed the history, physical exam and medical decision making elements that were  entered into the chart. Documentation assistance was provided by me personally, a scribe. Signed by Marylin Crosby Scribe, on February 04, 2022 at 6:04 AM.         ----------------------------------------------------------------------------------------------------------------------  February 05, 2022 7:52 AM. Documentation assistance provided by the Scribe. I was present during the time the encounter was recorded. The information recorded by the Scribe was done at my direction and has been reviewed and validated by me.  ----------------------------------------------------------------------------------------------------------------------     Everette Rank, MD - PGY2  Dept of Obstetrics & Gynecology  [facility]

## 2022-02-04 NOTE — Unmapped (Signed)
GYN ONC PROGRESS NOTE    ASSESSMENT AND PLAN     Robin Gillespie is a 31 y.o. 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     Onc: Gestational Trophoblastic Neoplasm  - Remote h/o molar preg 2019 followed to hCG undetectable x 6 months. Most recent preg > term CS 02/2021 (normal placental path)  - 04/07/2021: Px for IUD w/ +UPT; hCG persistently elevated > D&C 5/5 > negative pathology   - B-hGC 2739 (7/14) > 833 (9/18) > 435 (10/20) > Admit 23.8  - 9/22 CT CAP: Heterogenous uterus w/ myometrium up to 5 cm, solid pulm nodules in RLL and lingula c/w metastatic disease  - 10/1 MRI Brain: incidental scatter foci of T2 nonspecific > referred to Neurology on 10/4.   - Strongly desires fertility preservation. Risk factors include GTN remote from term pregnancy.  - Plan for 3 cycles of EMA (D1-2) then outpatient CO (D8)  - (D1-2) on Fri/Sat's with outpatient CO (D8) on Fridays > currently receiving, no adverse reactions. Chemo scheduled to finish this evening, may consider DC tonight or tomorrow AM pending pt preference  - Plan for home administration of Neupogen 300 mcg daily x 3d starting 24 hrs after completion of inpatient chemo (D3-5). [facility]. Patient will need additional needles and syringes for her Neupogen.  - Will plan for dc with remaining leucovorin (q12h x 4 starting 24hrs after methotrexate dose), as well as Zofran 8mg  PO TID PRN and Compazine 10 mg PO q6h PRN.      Anxiety  - Anxiety can precipitate nausea/vomiting  - PRN Ativan 0.5 mg q6h, Atarax 25 mg q6h  - Pt endorsed insomnia related to anxiety about caring for her child at home. Discussed SSRI vs sleep agents (ex. Melatonin) to patient this AM, she will continue to think about it.     PPX:  - Bowel regimen and antiemetics prn  - SCDs, Lovenox, ambulation     Dispo: Floor    Plan discussed with Dr. Chester Holstein and Attending Dr. Reyne Dumas, immediately available.    SUBJECTIVE     Pt feeling at baseline this morning, however notes stomach feels a little upset. Denies nausea but endorses unsettled stomach. Attributes to take out food consumed last evening. Also notes insomnia with regular thoughts of child at home. Has not tried anything for mood or sleep    OBJECTIVE     Temp:  [36.3 ??C (97.4 ??F)-36.7 ??C (98 ??F)] 36.7 ??C (98 ??F)  Heart Rate:  [70-83] 75  Resp:  [18-20] 18  BP: (120-131)/(61-87) 120/61  SpO2:  [100 %] 100 %    Gen: NAD, awake, alert, oriented  CV: regular rate and rhythm   Pulm: clear to auscultation bilaterally, normal work of breathing  Abd: Soft, appropriately tender to palpation, non-distended, bowel sounds present  GU: deferred    Medications (scheduled)    DACTINomycin  0.5 mg Intravenous Q24H    dexAMETHasone  8 mg Oral Q24H    diphenhydrAMINE  25 mg Oral Q24H    enoxaparin (LOVENOX) injection  40 mg Subcutaneous Q24H    etoposide  100 mg/m2 (Order-Specific) Intravenous Q24H    famotidine  40 mg Oral Q24H    leuCOVorin  15 mg Oral Q12H    methotrexate (Preservative Free) 546 mg in sodium chloride (NS) 0.9 % 1,000 mL IVPB  300 mg/m2 (Order-Specific) Intravenous Once  norethindrone-ethinyl estradiol  1 tablet Oral Daily    ondansetron  24 mg Oral Q24H       Medications (prn)  acetaminophen, calcium carbonate, Chemo Clarification Order, dexAMETHasone, diphenhydrAMINE, senna **AND** docusate sodium, EPINEPHrine IM, famotidine (PEPCID) IV, hydrOXYzine, IP okay to treat, LORazepam, LORazepam, melatonin, methylPREDNISolone sodium succinate, ondansetron, ondansetron, prochlorperazine (COMPAZINE) 10 mg in sodium chloride (NS) 0.9 % 25 mL IVPB, prochlorperazine, promethazine, promethazine, sodium chloride, sodium chloride 0.9%    Labs:   Lab Results   Component Value Date    WBC 2.0 (L) 02/03/2022    HGB 11.0 (L) 02/03/2022    HCT 32.4 (L) 02/03/2022    PLT 325 02/03/2022       Lab Results   Component Value Date    NA 138 02/03/2022    K 3.6 02/03/2022    CL 106 02/03/2022    CO2 26.0 02/03/2022    BUN 7 (L) 02/03/2022    CREATININE 0.72 02/03/2022    GLU 101 02/03/2022    CALCIUM 9.4 02/03/2022       Lab Results   Component Value Date    BILITOT 0.9 02/03/2022    PROT 7.7 02/03/2022    ALBUMIN 4.1 02/03/2022    ALT 30 02/03/2022    AST 23 02/03/2022    ALKPHOS 67 02/03/2022       No results found for: LABPROT, INR, APTT     Scribe's Attestation: Carney Corners, MD obtained and performed the history, physical exam and medical decision making elements that were  entered into the chart. Documentation assistance was provided by me personally, a scribe. Signed by Marylin Crosby Scribe, on February 04, 2022 at 5:06 AM.     ----------------------------------------------------------------------------------------------------------------------  February 04, 2022 8:31 AM. Documentation assistance provided by the Scribe. I was present during the time the encounter was recorded. The information recorded by the Scribe was done at my direction and has been reviewed and validated by me.  ----------------------------------------------------------------------------------------------------------------------    Carney Corners, MD PGY-2  Department of Obstetrics and Gynecology  [facility]

## 2022-02-05 LAB — CBC
HEMATOCRIT: 30.8 % — ABNORMAL LOW (ref 34.0–44.0)
HEMOGLOBIN: 10.2 g/dL — ABNORMAL LOW (ref 11.3–14.9)
MEAN CORPUSCULAR HEMOGLOBIN CONC: 33.3 g/dL (ref 32.0–36.0)
MEAN CORPUSCULAR HEMOGLOBIN: 26.9 pg (ref 25.9–32.4)
MEAN CORPUSCULAR VOLUME: 80.8 fL (ref 77.6–95.7)
MEAN PLATELET VOLUME: 8.5 fL (ref 6.8–10.7)
PLATELET COUNT: 387 10*9/L (ref 150–450)
RED BLOOD CELL COUNT: 3.81 10*12/L — ABNORMAL LOW (ref 3.95–5.13)
RED CELL DISTRIBUTION WIDTH: 14.6 % (ref 12.2–15.2)
WBC ADJUSTED: 4.8 10*9/L (ref 3.6–11.2)

## 2022-02-05 LAB — URINALYSIS WITH MICROSCOPY
BACTERIA: NONE SEEN /HPF
BILIRUBIN UA: NEGATIVE
BLOOD UA: NEGATIVE
GLUCOSE UA: NEGATIVE
KETONES UA: NEGATIVE
LEUKOCYTE ESTERASE UA: NEGATIVE
NITRITE UA: NEGATIVE
PH UA: 7.5 (ref 5.0–9.0)
PROTEIN UA: NEGATIVE
RBC UA: <1 /HPF (ref <=4)
SPECIFIC GRAVITY UA: 1.009 (ref 1.003–1.030)
SQUAMOUS EPITHELIAL: 1 /HPF (ref 0–5)
UROBILINOGEN UA: <2
WBC UA: <1 /HPF (ref 0–5)

## 2022-02-05 LAB — MAGNESIUM: MAGNESIUM: 1.4 mg/dL — ABNORMAL LOW (ref 1.6–2.6)

## 2022-02-05 LAB — BASIC METABOLIC PANEL
ANION GAP: 7 mmol/L (ref 5–14)
BLOOD UREA NITROGEN: 8 mg/dL — ABNORMAL LOW (ref 9–23)
BUN / CREAT RATIO: 9
CALCIUM: 8.6 mg/dL — ABNORMAL LOW (ref 8.7–10.4)
CHLORIDE: 106 mmol/L (ref 98–107)
CO2: 28 mmol/L (ref 20.0–31.0)
CREATININE: 0.86 mg/dL
EGFR CKD-EPI (2021) FEMALE: >90 mL/min/{1.73_m2} (ref >=60)
GLUCOSE RANDOM: 256 mg/dL — ABNORMAL HIGH (ref 70–179)
POTASSIUM: 3.6 mmol/L (ref 3.4–4.8)
SODIUM: 141 mmol/L (ref 135–145)

## 2022-02-05 LAB — PHOSPHORUS: PHOSPHORUS: 3 mg/dL (ref 2.4–5.1)

## 2022-02-05 MED ORDER — LORATADINE 10 MG TABLET
ORAL_TABLET | Freq: Every day | ORAL | 0 refills | 30 days | Status: CP
Start: 2022-02-05 — End: 2022-03-07
  Filled 2022-02-05: qty 30, 30d supply, fill #0

## 2022-02-05 MED ORDER — ONDANSETRON HCL 4 MG TABLET
ORAL_TABLET | Freq: Three times a day (TID) | ORAL | 0 refills | 5 days | Status: CP | PRN
Start: 2022-02-05 — End: 2022-02-12
  Filled 2022-02-05: qty 30, 5d supply, fill #0

## 2022-02-05 MED ORDER — PROCHLORPERAZINE MALEATE 10 MG TABLET
ORAL_TABLET | Freq: Four times a day (QID) | ORAL | 0 refills | 8 days | Status: CP | PRN
Start: 2022-02-05 — End: 2022-02-13
  Filled 2022-02-05: qty 30, 8d supply, fill #0

## 2022-02-05 MED ORDER — SYRINGE WITH NEEDLE 3 ML 25 X 5/8"
0 refills | 0 days | Status: CP
Start: 2022-02-05 — End: ?
  Filled 2022-02-05: qty 3, 3d supply, fill #0

## 2022-02-05 MED ORDER — LEUCOVORIN CALCIUM 15 MG TABLET
ORAL_TABLET | Freq: Two times a day (BID) | ORAL | 0 refills | 2 days | Status: CP
Start: 2022-02-05 — End: 2022-02-07
  Filled 2022-02-05: qty 3, 2d supply, fill #0

## 2022-02-05 MED ADMIN — norethindrone-e.estradioL-iron 1 tablet **Patient Supplied Medication**: 1 | ORAL | @ 14:00:00 | Stop: 2022-02-05

## 2022-02-05 MED ADMIN — etoposide (VEPESID) 182 mg in sodium chloride NON-PVC (NS) 0.9 % 500 mL IVPB: 100 mg/m2 | INTRAVENOUS | @ 01:00:00 | Stop: 2022-02-04

## 2022-02-05 MED ADMIN — enoxaparin (LOVENOX) syringe 40 mg: 40 mg | SUBCUTANEOUS

## 2022-02-05 MED ADMIN — leuCOVorin (WELLCOVORIN) tablet 15 mg: 15 mg | ORAL | @ 03:00:00 | Stop: 2022-02-06

## 2022-02-05 MED ADMIN — ondansetron (ZOFRAN) tablet 24 mg: 24 mg | ORAL | Stop: 2022-02-04

## 2022-02-05 MED ADMIN — sodium bicarbonate 100 mEq in dextrose 5 % 1,100 mL continuous infusion: 150 mL/h | INTRAVENOUS | @ 10:00:00 | Stop: 2022-02-05

## 2022-02-05 MED ADMIN — diphenhydrAMINE (BENADRYL) capsule/tablet 25 mg: 25 mg | ORAL | Stop: 2022-02-04

## 2022-02-05 MED ADMIN — magnesium oxide (MAG-OX) tablet 400 mg: 400 mg | ORAL | @ 10:00:00 | Stop: 2022-02-05

## 2022-02-05 MED ADMIN — leuCOVorin (WELLCOVORIN) tablet 15 mg: 15 mg | ORAL | @ 14:00:00 | Stop: 2022-02-05

## 2022-02-05 MED ADMIN — DACTINomycin (COSMEGEN) syringe: .5 mg | INTRAVENOUS | @ 02:00:00 | Stop: 2022-02-04

## 2022-02-05 MED ADMIN — magnesium sulfate 2gm/50mL IVPB: 2 g | INTRAVENOUS | @ 12:00:00 | Stop: 2022-02-05

## 2022-02-05 MED ADMIN — dexAMETHasone (DECADRON) tablet 8 mg: 8 mg | ORAL | Stop: 2022-02-04

## 2022-02-05 MED ADMIN — famotidine (PEPCID) tablet 40 mg: 40 mg | ORAL | Stop: 2022-02-04

## 2022-02-05 MED ADMIN — heparin, porcine (PF) 100 unit/mL injection 5 mL: 5 mL | INTRAVENOUS | @ 14:00:00 | Stop: 2022-02-05

## 2022-02-05 MED ADMIN — sodium bicarbonate 100 mEq in dextrose 5 % 1,100 mL continuous infusion: 150 mL/h | INTRAVENOUS | @ 02:00:00

## 2022-02-05 NOTE — Unmapped (Signed)
Vital signs stable. Denies pain. Received leucovorin po as scheduled this shift. No acute clinical change. Seen by GYN Onc Resident Provider and determined to be discharged home. Prescriptions delivered at bedside. Home care instructions given to patient and husband at bedside. Both verbalized good understanding with teach back. Patient discharged home and left the unit with her personal belongings.   Problem: Adult Inpatient Plan of Care  Goal: Plan of Care Review  Outcome: Resolved  Goal: Patient-Specific Goal (Individualized)  Outcome: Resolved  Goal: Absence of Hospital-Acquired Illness or Injury  Outcome: Resolved  Intervention: Identify and Manage Fall Risk  Safety Interventions: low bed  Intervention: Prevent and Manage VTE (Venous Thromboembolism) Risk  Activity Management: up ad lib  Goal: Optimal Comfort and Wellbeing  Outcome: Resolved  Goal: Readiness for Transition of Care  Outcome: Resolved  Goal: Rounds/Family Conference  Outcome: Resolved     Problem: Nausea and Vomiting (Chemotherapy Effects)  Goal: Fluid and Electrolyte Balance  Outcome: Resolved  Intervention: Prevent and Manage Nausea and Vomiting  Oral Care: teeth brushed     Problem: Neurotoxicity (Chemotherapy Effects)  Goal: Neurotoxicity Symptom Control  Outcome: Resolved     Problem: Neutropenia (Chemotherapy Effects)  Goal: Absence of Infection  Outcome: Resolved  Intervention: Prevent Infection and Maximize Resistance  Perineal Care: perineum cleansed  Oral Care: teeth brushed     Problem: Oral Mucositis (Chemotherapy Effects)  Goal: Improved Oral Mucous Membrane Integrity  Outcome: Resolved  Intervention: Promote Oral Comfort and Health  Oral Care: teeth brushed

## 2022-02-05 NOTE — Unmapped (Signed)
Vitals stable and patient remains afebrile. Patient with no complaints overnight. Patient completed cycle 2 day 2 chemotherapy without issues. PO leucovorin started tonight as ordered. Patient tolerating diet and passing flatus. Had BM on day shift. Voiding adequately. No falls or injuries have occurred. Patient updated on plan of care and is without questions. Patient remains on neutropenic precautions.   Problem: Adult Inpatient Plan of Care  Goal: Plan of Care Review  Outcome: Ongoing - Unchanged  Goal: Patient-Specific Goal (Individualized)  Outcome: Ongoing - Unchanged  Goal: Absence of Hospital-Acquired Illness or Injury  Outcome: Ongoing - Unchanged  Intervention: Identify and Manage Fall Risk  Recent Flowsheet Documentation  Taken 02/04/2022 1905 by Wells Guiles, RN  Safety Interventions:   chemotherapeutic agent precautions   family at bedside   infection management   isolation precautions   lighting adjusted for tasks/safety   low bed   nonskid shoes/slippers when out of bed   room near unit station  Intervention: Prevent and Manage VTE (Venous Thromboembolism) Risk  Recent Flowsheet Documentation  Taken 02/04/2022 1905 by Wells Guiles, RN  Activity Management: up ad lib  Intervention: Prevent Infection  Recent Flowsheet Documentation  Taken 02/04/2022 1905 by Wells Guiles, RN  Infection Prevention:   cohorting utilized   environmental surveillance performed   hand hygiene promoted   equipment surfaces disinfected   personal protective equipment utilized   rest/sleep promoted   single patient room provided   visitors restricted/screened  Goal: Optimal Comfort and Wellbeing  Outcome: Ongoing - Unchanged  Goal: Readiness for Transition of Care  Outcome: Ongoing - Unchanged  Goal: Rounds/Family Conference  Outcome: Ongoing - Unchanged     Problem: Nausea and Vomiting (Chemotherapy Effects)  Goal: Fluid and Electrolyte Balance  Outcome: Ongoing - Unchanged     Problem: Neurotoxicity (Chemotherapy Effects)  Goal: Neurotoxicity Symptom Control  Outcome: Ongoing - Unchanged     Problem: Neutropenia (Chemotherapy Effects)  Goal: Absence of Infection  Outcome: Ongoing - Unchanged  Intervention: Prevent Infection and Maximize Resistance  Recent Flowsheet Documentation  Taken 02/04/2022 1905 by Wells Guiles, RN  Infection Prevention:   cohorting utilized   environmental surveillance performed   hand hygiene promoted   equipment surfaces disinfected   personal protective equipment utilized   rest/sleep promoted   single patient room provided   visitors restricted/screened     Problem: Oral Mucositis (Chemotherapy Effects)  Goal: Improved Oral Mucous Membrane Integrity  Outcome: Ongoing - Unchanged

## 2022-02-05 NOTE — Unmapped (Shared)
GYN ONC PROGRESS NOTE    ASSESSMENT AND PLAN     Robin Gillespie is a 31 y.o. 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     Onc: Gestational Trophoblastic Neoplasm  - Remote h/o molar preg 2019 followed to hCG undetectable x 6 months. Most recent preg > term CS 02/2021 (normal placental path)  - 04/07/2021: Px for IUD w/ +UPT; hCG persistently elevated > D&C 5/5 > negative pathology   - B-hGC 2739 (7/14) > 833 (9/18) > 435 (10/20) > Admit 23.8   - 9/22 CT CAP: Heterogenous uterus w/ myometrium up to 5 cm, solid pulm nodules in RLL and lingula c/w metastatic disease  - 10/1 MRI Brain: incidental scatter foci of T2 nonspecific > referred to Neurology on 10/4.   - Strongly desires fertility preservation. Risk factors include GTN remote from term pregnancy.  - Plan for 3 cycles of EMA (D1-2) then outpatient CO (D8)  - (D1-2) on Fri/Sat's with outpatient CO (D8) on Fridays completed 10/28, no adverse reactions    - Plan for home administration of Neupogen 300 mcg daily x 3d starting 24 hrs after completion of inpatient chemo (D3-5). [facility]. Patient will need additional needles and syringes for her Neupogen.  - Will plan for dc with remaining leucovorin (q12h x 4 starting 24hrs after methotrexate dose), as well as Zofran 8mg  PO TID PRN and Compazine 10 mg PO q6h PRN.      Anxiety  - Anxiety can precipitate nausea/vomiting  - PRN Ativan 0.5 mg q6h, Atarax 25 mg q6h  - Pt endorsed insomnia related to anxiety about caring for her child at home. Discussed SSRI vs sleep agents (ex. Melatonin) to patient this AM, she will continue to think about it.     PPX:  - Bowel regimen and antiemetics prn  - SCDs, Lovenox, ambulation     Dispo: Floor    Plan discussed with Dr. Chester Holstein and Attending Dr. Reyne Dumas, immediately available.    SUBJECTIVE     Patient feels pretty good, was able to get some rest, is ready to go early this morning.     OBJECTIVE     Temp:  [36.4 ??C (97.5 ??F)-37.5 ??C (99.5 ??F)] 36.4 ??C (97.5 ??F)  Heart Rate:  [79-89] 79  Resp:  [18] 18  BP: (118-137)/(57-69) 118/57  SpO2:  [100 %] 100 %    Gen: NAD, awake, alert, oriented  CV: regular rate and rhythm   Pulm: clear to auscultation bilaterally, normal work of breathing  Abd: Soft, appropriately tender to palpation, non-distended, bowel sounds present  GU: deferred    Medications (scheduled)    enoxaparin (LOVENOX) injection  40 mg Subcutaneous Q24H    leuCOVorin  15 mg Oral Q12H    norethindrone-e.estradioL-iron  1 tablet Oral Daily    potassium phosphate (monobasic)  500 mg Oral 4xd Meals & HS       Medications (prn)  acetaminophen, calcium carbonate, Chemo Clarification Order, dexAMETHasone, diphenhydrAMINE, senna **AND** docusate sodium, EPINEPHrine IM, famotidine (PEPCID) IV, hydrOXYzine, IP okay to treat, LORazepam, LORazepam, melatonin, methylPREDNISolone sodium succinate, ondansetron, ondansetron, prochlorperazine (COMPAZINE) 10 mg in sodium chloride (NS) 0.9 % 25 mL IVPB, prochlorperazine, promethazine, promethazine, sodium chloride, sodium chloride 0.9%    Labs:   Lab Results   Component Value Date    WBC 4.8 02/05/2022    HGB 10.2 (L) 02/05/2022  HCT 30.8 (L) 02/05/2022    PLT 387 02/05/2022       Lab Results   Component Value Date    NA 141 02/05/2022    K 3.6 02/05/2022    CL 106 02/05/2022    CO2 28.0 02/05/2022    BUN 8 (L) 02/05/2022    CREATININE 0.86 02/05/2022    GLU 256 (H) 02/05/2022    CALCIUM 8.6 (L) 02/05/2022    MG 1.4 (L) 02/05/2022    PHOS 3.0 02/05/2022       Lab Results   Component Value Date    BILITOT 0.9 02/03/2022    PROT 7.7 02/03/2022    ALBUMIN 4.1 02/03/2022    ALT 30 02/03/2022    AST 23 02/03/2022    ALKPHOS 67 02/03/2022       No results found for: LABPROT, INR, APTT     Scribe's Attestation: ***, MD obtained and performed the history, physical exam and medical decision making elements that were  entered into the chart. Documentation assistance was provided by me personally, a scribe. Signed by Marcy Siren Scribe, on February 05, 2022 at 6:25 AM.         {*** NOTE TO PROVIDER: PLEASE ADD ATTESTATION NOTING YOU AGREE WITH SCRIBE DOCUMENTATION. For template- Go to smartphrases --> Orantes, C --> [id number]}

## 2022-02-06 MED ORDER — BD LUER-LOK SYRINGE 3 ML 25 X 5/8"
0 refills | 0.00000 days | Status: CN
Start: 2022-02-06 — End: ?

## 2022-02-08 MED ORDER — NEUPOGEN 300 MCG/ML INJECTION SOLUTION
2 refills | 0 days | Status: CP
Start: 2022-02-08 — End: ?
  Filled 2022-02-09: qty 12, 28d supply, fill #0

## 2022-02-08 NOTE — Unmapped (Signed)
Clinical Assessment Needed For: Dose Change  Medication: Neupogen  Last Fill Date/Day Supply: 10-9 / 28  Copay $3  Was previous dose already scheduled to fill: No    Notes to Pharmacist:

## 2022-02-08 NOTE — Unmapped (Signed)
[facility] Clinical Assessment & Refill Coordination Note    Robin Gillespie, DOB: December 19, 1990  Phone: [phone] (home)     All above HIPAA information was verified with patient.     Was a Nurse, learning disability used for this call? No    Specialty Medication(s):   Hematology/Oncology: Neupogen     Current Outpatient Medications   Medication Sig Dispense Refill    empty container (SHARPS CONTAINER) Misc Use as directed 1 each 2    filgrastim (NEUPOGEN) 300 mcg/mL injection Inject 1 mL (300 mcg total) under the skin 24 hours after completion chemotherapy. Repeat every 24 hours for 3 days. 6 mL 3    filgrastim (NEUPOGEN) 300 mcg/mL injection Inject 300 mcg on days 4, 5, 6, 10, 11, and 12 of each cycle. Each cycle is 14 days long. 12 mL 2    loratadine (CLARITIN) 10 mg tablet Take 1 tablet (10 mg total) by mouth daily. Start taking 1 day prior to filgrastim injections and continue until 1 day after last injection 30 tablet 0    loratadine (CLARITIN) 10 mg tablet Take 1 tablet (10 mg total) by mouth daily. 30 tablet 0    norethindrone-e.estradiol-iron (LOESTRIN FE 1/20, 28, ORAL) Take 1 tablet by mouth in the morning.      ondansetron (ZOFRAN) 4 MG tablet Take 2 tablets (8 mg total) by mouth every eight (8) hours as needed (breakthrough nausea or vomiting unrelieved by prochlorperazine or promethazine) for up to 7 days. 30 tablet 0    prochlorperazine (COMPAZINE) 10 MG tablet Take 1 tablet (10 mg total) by mouth every six (6) hours as needed (breakthrough nausea or vomiting) for up to 8 days. 30 tablet 0    syringe with needle (BD LUER-LOK SYRINGE) 3 mL 25 x 5/8 Syrg Use to inject neupogen at home under the skin 6 each 3    syringe with needle 3 mL 25 x 5/8 Syrg Use to inject Neupogen at home under the skin. 3 each 0     No current facility-administered medications for this visit.        Changes to medications: Tatyanna reports no changes at this time.    No Known Allergies    Changes to allergies: No    SPECIALTY MEDICATION ADHERENCE     Neupogen 300  mcg/ml : 4 days of medicine on hand       Medication Adherence    Patient reported X missed doses in the last month: 0  Specialty Medication: Neupogen 300 mcg/ml  Patient is on additional specialty medications: No  Informant: patient                  Confirmed plan for next specialty medication refill: delivery by pharmacy  Refills needed for supportive medications: not needed          Specialty medication(s) dose(s) confirmed: Patient reports changes to the regimen as follows: now taking on days 4,5,6,10,11,12 of each 14 day cycle      Are there any concerns with adherence? No    Adherence counseling provided? Not needed    CLINICAL MANAGEMENT AND INTERVENTION      Clinical Benefit Assessment:    Do you feel the medicine is effective or helping your condition? Yes    Clinical Benefit counseling provided? Not needed    Adverse Effects Assessment:    Are you experiencing any side effects? Yes, patient reports experiencing slight bone pain. Side effect counseling provided: not needed  Are you experiencing difficulty administering your medicine? No    Quality of Life Assessment:    Quality of Life    Rheumatology  Oncology  Dermatology  Cystic Fibrosis          How many days over the past month did your condition  keep you from your normal activities? For example, brushing your teeth or getting up in the morning. 0    Have you discussed this with your provider? Not needed    Acute Infection Status:    Acute infections noted within Epic:  No active infections  Patient reported infection: None    Therapy Appropriateness:    Is therapy appropriate and patient progressing towards therapeutic goals? Yes, therapy is appropriate and should be continued    DISEASE/MEDICATION-SPECIFIC INFORMATION      For patients on injectable medications: Patient currently has 0 doses left.  Next injection is scheduled for 02/13/22.    Oncology: Is the patient receiving adequate infection prevention treatment? Not applicable  Does the patient have adequate nutritional support? Not applicable    PATIENT SPECIFIC NEEDS     Does the patient have any physical, cognitive, or cultural barriers? No    Is the patient high risk? No    Did the patient require a clinical intervention? No    Does the patient require physician intervention or other additional services (i.e., nutrition, smoking cessation, social work)? No    SOCIAL DETERMINANTS OF HEALTH     At the [facility], we have learned that life circumstances - like trouble affording food, housing, utilities, or transportation can affect the health of many of our patients.   That is why we wanted to ask: are you currently experiencing any life circumstances that are negatively impacting your health and/or quality of life? No    Social Determinants of Psychologist, prison and probation services Strain: Not on file   Internet Connectivity: Not on file   Food Insecurity: Not on file   Tobacco Use: Low Risk  (01/27/2022)    Patient History     Smoking Tobacco Use: Never     Smokeless Tobacco Use: Never     Passive Exposure: Not on file   Housing/Utilities: Not on file   Alcohol Use: Not on file   Transportation Needs: Not on file   Substance Use: Not on file   Health Literacy: Not on file   Physical Activity: Not on file   Interpersonal Safety: Not on file   Stress: Not on file   Intimate Partner Violence: Not on file   Depression: Not on file   Social Connections: Not on file       Would you be willing to receive help with any of the needs that you have identified today? Not applicable       SHIPPING     Specialty Medication(s) to be Shipped:   Hematology/Oncology: Neupogen    Other medication(s) to be shipped:  Syringes     Changes to insurance: No    Delivery Scheduled: Yes, Expected medication delivery date: 02/10/22.     Medication will be delivered via UPS to the confirmed prescription address in [facility].    The patient will receive a drug information handout for each medication shipped and additional FDA Medication Guides as required.  Verified that patient has previously received a Conservation officer, historic buildings and a Surveyor, mining.    The patient or caregiver noted above participated in the development of this care  plan and knows that they can request review of or adjustments to the care plan at any time.      All of the patient's questions and concerns have been addressed.    Lumi Winslett Vangie Bicker, PharmD   [facility] Specialty Pharmacist

## 2022-02-09 MED FILL — BD LUER-LOK SYRINGE 3 ML 25 X 5/8": 28 days supply | Qty: 12 | Fill #0

## 2022-02-10 ENCOUNTER — Ambulatory Visit: Admit: 2022-02-10 | Discharge: 2022-02-11 | Payer: BLUE CROSS/BLUE SHIELD

## 2022-02-10 ENCOUNTER — Other Ambulatory Visit: Admit: 2022-02-10 | Discharge: 2022-02-11 | Payer: BLUE CROSS/BLUE SHIELD

## 2022-02-10 DIAGNOSIS — O019 Hydatidiform mole, unspecified: Secondary | ICD-10-CM | POA: Diagnosis not present

## 2022-02-10 DIAGNOSIS — Z79899 Other long term (current) drug therapy: Secondary | ICD-10-CM | POA: Diagnosis not present

## 2022-02-10 DIAGNOSIS — Z5111 Encounter for antineoplastic chemotherapy: Secondary | ICD-10-CM | POA: Diagnosis not present

## 2022-02-10 DIAGNOSIS — R000 Tachycardia, unspecified: Secondary | ICD-10-CM | POA: Diagnosis not present

## 2022-02-10 LAB — COMPREHENSIVE METABOLIC PANEL
ALBUMIN: 4.1 g/dL (ref 3.4–5.0)
ALKALINE PHOSPHATASE: 78 U/L (ref 46–116)
ALT (SGPT): 30 U/L (ref 10–49)
ANION GAP: 7 mmol/L (ref 5–14)
AST (SGOT): 11 U/L (ref <=34)
BILIRUBIN TOTAL: 0.5 mg/dL (ref 0.3–1.2)
BLOOD UREA NITROGEN: 13 mg/dL (ref 9–23)
BUN / CREAT RATIO: 17
CALCIUM: 9.4 mg/dL (ref 8.7–10.4)
CHLORIDE: 104 mmol/L (ref 98–107)
CO2: 26 mmol/L (ref 20.0–31.0)
CREATININE: 0.77 mg/dL
EGFR CKD-EPI (2021) FEMALE: >90 mL/min/{1.73_m2} (ref >=60)
GLUCOSE RANDOM: 121 mg/dL (ref 70–179)
POTASSIUM: 4 mmol/L (ref 3.4–4.8)
PROTEIN TOTAL: 8.5 g/dL — ABNORMAL HIGH (ref 5.7–8.2)
SODIUM: 137 mmol/L (ref 135–145)

## 2022-02-10 LAB — CBC W/ AUTO DIFF
BASOPHILS ABSOLUTE COUNT: 0 10*9/L (ref 0.0–0.1)
BASOPHILS RELATIVE PERCENT: 1 %
EOSINOPHILS ABSOLUTE COUNT: 0.1 10*9/L (ref 0.0–0.5)
EOSINOPHILS RELATIVE PERCENT: 1.7 %
HEMATOCRIT: 36.1 % (ref 34.0–44.0)
HEMOGLOBIN: 12.7 g/dL (ref 11.3–14.9)
LYMPHOCYTES ABSOLUTE COUNT: 1.5 10*9/L (ref 1.1–3.6)
LYMPHOCYTES RELATIVE PERCENT: 32.3 %
MEAN CORPUSCULAR HEMOGLOBIN CONC: 35.2 g/dL (ref 32.0–36.0)
MEAN CORPUSCULAR HEMOGLOBIN: 28.1 pg (ref 25.9–32.4)
MEAN CORPUSCULAR VOLUME: 79.8 fL (ref 77.6–95.7)
MEAN PLATELET VOLUME: 7.6 fL (ref 6.8–10.7)
MONOCYTES ABSOLUTE COUNT: 0.3 10*9/L (ref 0.3–0.8)
MONOCYTES RELATIVE PERCENT: 6.1 %
NEUTROPHILS ABSOLUTE COUNT: 2.7 10*9/L (ref 1.8–7.8)
NEUTROPHILS RELATIVE PERCENT: 58.9 %
PLATELET COUNT: 522 10*9/L — ABNORMAL HIGH (ref 150–450)
RED BLOOD CELL COUNT: 4.52 10*12/L (ref 3.95–5.13)
RED CELL DISTRIBUTION WIDTH: 14.8 % (ref 12.2–15.2)
WBC ADJUSTED: 4.5 10*9/L (ref 3.6–11.2)

## 2022-02-10 LAB — HCG QUANTITATIVE, BLOOD: GONADOTROPIN, CHORIONIC (HCG) QUANT: 4.5 m[IU]/mL

## 2022-02-10 MED ADMIN — ondansetron (ZOFRAN) tablet 24 mg: 24 mg | ORAL | @ 20:00:00 | Stop: 2022-02-10

## 2022-02-10 MED ADMIN — dexAMETHasone (DECADRON) tablet 8 mg: 8 mg | ORAL | @ 20:00:00 | Stop: 2022-02-10

## 2022-02-10 MED ADMIN — vinCRIStine (ONCOVIN) 1.46 mg in sodium chloride (NS) 0.9 % 25 mL IVPB: .8 mg/m2 | INTRAVENOUS | @ 20:00:00 | Stop: 2022-02-10

## 2022-02-10 MED ADMIN — cycloPHOSphamide (CYTOXAN) 1,092 mg in sodium chloride (NS) 0.9 % 250 mL IVPB: 600 mg/m2 | INTRAVENOUS | @ 20:00:00 | Stop: 2022-02-10

## 2022-02-10 MED ADMIN — sodium chloride 0.9% (NS) bolus 500 mL: 500 mL | INTRAVENOUS | @ 20:00:00 | Stop: 2022-02-10

## 2022-02-10 NOTE — Unmapped (Signed)
Gynecologic Oncology Return Clinic Visit    Date of Service: 02/10/2022    Assessment & Plan:  Robin Gillespie is a 31 y.o. 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    Chemo: Labs reviewed and within normal limits. Beta-hCG almost to negative.  Okay to proceed with treatment today.  We will go ahead and get patient scheduled for her next inpatient chemo in 1 week.  Reviewed that we will likely treat 2 cycles past negative beta-hCG. Hopeful that she may be negative at the start of her next cycle.    Likely viral infection: Pt overall well appearing. Mild tachycardia. Will plan for 500cc bolus of fluids with treatment. Pt aware that if she becomes febrile or develops worsening symptoms like shortness of breath or chest pain that she needs to be evaluated.    Patient will continue on OCPs.    RTC 2 weeks with next outpatient chemo.    [name], MD  Gynecologic Oncology      Medical Decision Making  I have independently reviewed notes from prior visits, results from labs and imaging. The current plan of care requires intensive monitoring for toxicity due to use of anti-neoplastic treatments. Medical decision making was of high complexity due to ongoing risk of morbidity/mortality from chemotherapy treatments.      -----------------------  Reason for Visit: Chemo monitoring    Treatment History:  Patient with a remote history of molar pregnancy in 2019 followed by undetectable hCG x6 months.  Most recent term pregnancy delivered via C-section in 02/2021.  Normal placental pathology.  Following delivery, patient was scheduled for IUD placement and was noted to have a positive urine pregnancy test 04/07/2021.  Due to persistently positive hCG, she underwent a D&C in May with negative pathology.  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  Brain MRI negative for metastases.    Interval History:  Pt reports that she tolerated her last treatment overall well. She was able to inject her neupogen at home as instructed and has tolerated it without issue. She has noticed a slight cough and congestion as of yesterday. She has a young child in daycare but has not been around sick contacts otherwise. She denies fevers, chills, chest pain, shortness of breath. She has not yet received her flu vaccine this fall and she was previously vaccinated against covid but has not yet had the recent booster. She denies any vaginal bleeding, nausea, vomiting, mouth sores.       Past Medical/Surgical History:  Past Medical History:   Diagnosis Date    Gestational trophoblastic disease        Past Surgical History:   Procedure Laterality Date    CESAREAN SECTION      IR INSERT PORT AGE GREATER THAN 5 YRS  01/12/2022    IR INSERT PORT AGE GREATER THAN 5 YRS 01/12/2022 [name], MD [facility]       No family history on file.    Social History     Socioeconomic History    Marital status: Single   Tobacco Use    Smoking status: Never    Smokeless tobacco: Never       Current Medications:    Current Outpatient Medications:     empty container (SHARPS CONTAINER) Misc, Use as directed, Disp: 1 each, Rfl: 2    filgrastim (NEUPOGEN) 300 mcg/mL  injection, Inject 1 mL (300 mcg total) under the skin 24 hours after completion chemotherapy. Repeat every 24 hours for 3 days., Disp: 6 mL, Rfl: 3    filgrastim (NEUPOGEN) 300 mcg/mL injection, Inject 300 mcg on days 4, 5, 6, 10, 11, and 12 of each cycle. Each cycle is 14 days long., Disp: 12 mL, Rfl: 2    loratadine (CLARITIN) 10 mg tablet, Take 1 tablet (10 mg total) by mouth daily. Start taking 1 day prior to filgrastim injections and continue until 1 day after last injection, Disp: 30 tablet, Rfl: 0    loratadine (CLARITIN) 10 mg tablet, Take 1 tablet (10 mg total) by mouth daily., Disp: 30 tablet, Rfl: 0    norethindrone-e.estradiol-iron (LOESTRIN FE 1/20, 28, ORAL), Take 1 tablet by mouth in the morning., Disp: , Rfl:     ondansetron (ZOFRAN) 4 MG tablet, Take 2 tablets (8 mg total) by mouth every eight (8) hours as needed (breakthrough nausea or vomiting unrelieved by prochlorperazine or promethazine) for up to 7 days., Disp: 30 tablet, Rfl: 0    prochlorperazine (COMPAZINE) 10 MG tablet, Take 1 tablet (10 mg total) by mouth every six (6) hours as needed (breakthrough nausea or vomiting) for up to 8 days., Disp: 30 tablet, Rfl: 0    syringe with needle (BD LUER-LOK SYRINGE) 3 mL 25 x 5/8 Syrg, Use to inject neupogen at home under the skin, Disp: 6 each, Rfl: 3    syringe with needle 3 mL 25 x 5/8 Syrg, Use to inject Neupogen at home under the skin., Disp: 3 each, Rfl: 0    Review of Symptoms:  Complete 10-system review is negative except as above in Interval History.    Physical Exam:  BP 130/89  - Pulse 102  - Temp 36.6 ??C (97.9 ??F) (Temporal)  - Resp 20  - Wt 76.5 kg (168 lb 9.6 oz)  - SpO2 100%  - BMI 31.86 kg/m??   General: Alert, oriented, no acute distress.  HEENT: Normocephalic, atraumatic. Neck symmetric without masses. Sclera anicteric. Posterior oropharynx clear.  No mucositis or lesions.  Chest: Normal work of breathing. Clear to auscultation bilaterally.  Port site clean.  Cardiovascular: sinus tachycardia, no murmurs.  Abdomen: Soft, nontender.    Extremities: Grossly normal range of motion.  Warm, well perfused.  No edema bilaterally.  Skin: No rashes or lesions noted.    Laboratory & Radiologic Studies:  Lab Results   Component Value Date    WBC 4.5 02/10/2022    HGB 12.7 02/10/2022    HCT 36.1 02/10/2022    PLT 522 (H) 02/10/2022    NEUTROABS 2.7 02/10/2022     Lab Results   Component Value Date    NA 137 02/10/2022    K 4.0 02/10/2022    CL 104 02/10/2022    CO2 26.0 02/10/2022    ANIONGAP 7 02/10/2022    BUN 13 02/10/2022    CREATININE 0.77 02/10/2022    BCR 17 02/10/2022    GLU 121 02/10/2022    CALCIUM 9.4 02/10/2022 ALBUMIN 4.1 02/10/2022    PROT 8.5 (H) 02/10/2022    BILITOT 0.5 02/10/2022    AST 11 02/10/2022    ALT 30 02/10/2022    ALKPHOS 78 02/10/2022     Lab Results   Component Value Date    HCGQUANT 4.5 02/10/2022    HCGQUANT 23.8 02/03/2022    HCGQUANT 435.0 01/27/2022

## 2022-02-10 NOTE — Unmapped (Signed)
Patient arrived to chair 23.  No complaints noted.  Access of port intact with blood return. Pre-medicated per treatment plan orders. Patient completed and tolerated treatment. Port de-accessed after 500 unit Heparin flush, site covered with band-aid dressing. AVS declined. Pt discharged to home, NAD.

## 2022-02-10 NOTE — Unmapped (Signed)
Patient was screened high risk for falls, the following steps were taken to reduce the risk of falls:      - Patient oriented to environment  - Physical environment was clear of slip/trip hazards  - Footwear observed to ensure it was appropriate, if not, patient advised on importance of well fitted, closed toe, sturdy shoes with non-skid soles  - Instructed patient and/or caregiver that patient is at risk for falls and encouraged requesting assistance before standing, ambulating or transferring  - ???Preventing falls: Care instructions??? placed in patient AVS

## 2022-02-10 NOTE — Unmapped (Signed)
Lab on 02/10/2022   Component Date Value Ref Range Status    Sodium 02/10/2022 137  135 - 145 mmol/L Final    Potassium 02/10/2022 4.0  3.4 - 4.8 mmol/L Final    Chloride 02/10/2022 104  98 - 107 mmol/L Final    CO2 02/10/2022 26.0  20.0 - 31.0 mmol/L Final    Anion Gap 02/10/2022 7  5 - 14 mmol/L Final    BUN 02/10/2022 13  9 - 23 mg/dL Final    Creatinine 40/01/2724 0.77  0.55 - 1.02 mg/dL Final    BUN/Creatinine Ratio 02/10/2022 17   Final    eGFR CKD-EPI (2021) Female 02/10/2022 >90  >=60 mL/min/1.89m2 Final    eGFR calculated with CKD-EPI 2021 equation in accordance with [employer] and [employer] of Nephrology Task Force recommendations.    Glucose 02/10/2022 121  70 - 179 mg/dL Final    Calcium 36/64/4034 9.4  8.7 - 10.4 mg/dL Final    Albumin 74/25/9563 4.1  3.4 - 5.0 g/dL Final    Total Protein 02/10/2022 8.5 (H)  5.7 - 8.2 g/dL Final    Total Bilirubin 02/10/2022 0.5  0.3 - 1.2 mg/dL Final    AST 87/56/4332 11  <=34 U/L Final    ALT 02/10/2022 30  10 - 49 U/L Final    Alkaline Phosphatase 02/10/2022 78  46 - 116 U/L Final    hCG Quantitative 02/10/2022 4.5  mIU/mL Final    WBC 02/10/2022 4.5  3.6 - 11.2 10*9/L Final    RBC 02/10/2022 4.52  3.95 - 5.13 10*12/L Final    HGB 02/10/2022 12.7  11.3 - 14.9 g/dL Final    HCT 95/18/8416 36.1  34.0 - 44.0 % Final    MCV 02/10/2022 79.8  77.6 - 95.7 fL Final    MCH 02/10/2022 28.1  25.9 - 32.4 pg Final    MCHC 02/10/2022 35.2  32.0 - 36.0 g/dL Final    RDW 60/63/0160 14.8  12.2 - 15.2 % Final    MPV 02/10/2022 7.6  6.8 - 10.7 fL Final    Platelet 02/10/2022 522 (H)  150 - 450 10*9/L Final    Neutrophils % 02/10/2022 58.9  % Final    Lymphocytes % 02/10/2022 32.3  % Final    Monocytes % 02/10/2022 6.1  % Final    Eosinophils % 02/10/2022 1.7  % Final    Basophils % 02/10/2022 1.0  % Final    Absolute Neutrophils 02/10/2022 2.7  1.8 - 7.8 10*9/L Final    Absolute Lymphocytes 02/10/2022 1.5  1.1 - 3.6 10*9/L Final    Absolute Monocytes 02/10/2022 0.3  0.3 - 0.8 10*9/L Final    Absolute Eosinophils 02/10/2022 0.1  0.0 - 0.5 10*9/L Final    Absolute Basophils 02/10/2022 0.0  0.0 - 0.1 10*9/L Final    Microcytosis 02/10/2022 Slight (A)  Not Present Final

## 2022-02-10 NOTE — Unmapped (Unsigned)
Port accessed.  Labs drawn & sent for analysis.  To next appt.  Care provided by Helyn App.

## 2022-02-12 NOTE — Progress Notes (Unsigned)
[facility]  PATIENT: Lynn Tucker DOB: [birth year]  REFERRING DOCTOR OR PCP: Bernadene Bell, MD SOURCE: Patient, notes from gynecology, laboratory and imaging reports, MRI images personally reviewed.  _________________________________   HISTORICAL  CHIEF COMPLAINT:  Chief Complaint  Patient presents with   New Patient (Initial Visit)    Pt in room #10 and alone. Pt here today for abnormal MRI of the head.    HISTORY OF PRESENT ILLNESS:  I had the pleasure of seeing your patient, Lynn Tucker, at [facility] for neurologic consultation regarding her abnormal brain MRI.  She is a 31 year old woman who was diagnosed with gestational trophoblastic disease after hCG remained detectable more than 6 months after pregnancy.  Of note, she had a prior molar pregnancy but had a term pregnancy 02/27/2021.  As part of a metastatic disease evaluation, she underwent MRI of the brain with and without contrast on 01/08/2022.ht  She occasionally has headaches, often due to eye strain.     She denies difficulty with gait, balance or strength.   Sometimes she gets intermittent tingling in legs if she sits a long time.    Vision is usually fine though she will sometimes not blurry vision when mor tired,   No diplopia.      MRI alo showed disc protrusion at C3C4.   She denies neck pain or radicular symptoms.     Of note, she carries a diagnosis of sickle cell trait and ventricular septal defect.  Echocardiogram 11/18/2002 stage "there was a small perimembranous ventricular septal defect".  This was not associated with other pathology.  She last saw cardiology in 2018 or so.  She has mild diabetes (recent glucose was 256 02/05/2022, hemoglobin A1c has been between 6.1 and 7 over the last 8 years) and hyperlipidemia.   She had gestational DM.   She was once on metformin.    Vascular risks:  NIDDM, sickle cell trait, ventricular septal defect .  She smoked some  socially in past but never consistently and not currently.    No FH of MS.  Her father had a stroke.        Imaging: MRI of the head 01/08/2022 showed a few T2/FLAIR hyperintense foci in the subcortical white matter.  These are nonspecific and more typical for microvascular ischemic changes, or sequela of migraine or cardio emboli rather than demyelination.  REVIEW OF SYSTEMS: Constitutional: No fevers, chills, sweats, or change in appetite Eyes: No visual changes, double vision, eye pain Ear, nose and throat: No hearing loss, ear pain, nasal congestion, sore throat Cardiovascular: No chest pain, palpitations Respiratory:  No shortness of breath at rest or with exertion.   No wheezes GastrointestinaI: No nausea, vomiting, diarrhea, abdominal pain, fecal incontinence Genitourinary:  No dysuria, urinary retention or frequency.  No nocturia. Musculoskeletal:  No neck pain, back pain Integumentary: No rash, pruritus, skin lesions Neurological: as above Psychiatric: No depression at this time.  No anxiety Endocrine: No palpitations, diaphoresis, change in appetite, change in weigh or increased thirst Hematologic/Lymphatic:  No anemia, purpura, petechiae. Allergic/Immunologic: No itchy/runny eyes, nasal congestion, recent allergic reactions, rashes  ALLERGIES: Allergies  Allergen Reactions   Metformin And Related     Causes oily stool & cramps   Phentermine     Anxiety, dry mouth, globus sensation    HOME MEDICATIONS:  Current Outpatient Medications:    ALPRAZolam (XANAX) 0.5 MG tablet, Take 1 tablet (0.5 mg total) by mouth once as needed for up to  1 dose for anxiety (For MRI)., Disp: 1 tablet, Rfl: 0   ibuprofen (ADVIL) 800 MG tablet, Take 1 tablet (800 mg total) by mouth every 8 (eight) hours as needed for moderate pain or cramping., Disp: 20 tablet, Rfl: 1   leucovorin (WELLCOVORIN) 15 MG tablet, Take 15 mg by mouth 2 (two) times a week., Disp: , Rfl:    loratadine (CLARITIN) 10 MG  tablet, Take 1 tablet by mouth daily., Disp: , Rfl:    NEUPOGEN 300 MCG/ML injection, Inject 300 mcg into the skin once a week., Disp: , Rfl:    ondansetron (ZOFRAN) 4 MG tablet, Take 4 mg by mouth as needed., Disp: , Rfl:    prochlorperazine (COMPAZINE) 10 MG tablet, Take 10 mg by mouth as needed., Disp: , Rfl:   PAST MEDICAL HISTORY: Past Medical History:  Diagnosis Date   ADHD (attention deficit hyperactivity disorder)    Chlamydia 11/11/2015   H. pylori infection 04/2014   off antibiotics at this time   Heart murmur    Hyperlipidemia    Impaired fasting glucose    2013   Molar pregnancy 04/25/2017   Obesity    Pre-diabetes    Short cervix during pregnancy in second trimester 09/21/2018   Sickle cell trait (HCC)    SVD (spontaneous vaginal delivery) 01/31/2019   Ventricular septal defect (VSD)    VSD (ventricular septal defect)    followed by cardiology every 2 years   Wears glasses     PAST SURGICAL HISTORY: Past Surgical History:  Procedure Laterality Date   CERVICAL CERCLAGE N/A 09/22/2018   Procedure: CERCLAGE CERVICAL;  Surgeon: Janyth Contes, MD;  Location: [facility];  Service: Gynecology;  Laterality: N/A;   CESAREAN SECTION N/A 02/26/2021   Procedure: CESAREAN SECTION;  Surgeon: Rowland Lathe, MD;  Location: [facility];  Service: Obstetrics;  Laterality: N/A;  Primary C/S for Fetal Intolerance to [place] N/A 08/05/2015   Procedure: DILATATION AND EVACUATION;  Surgeon: Marylynn Pearson, MD;  Location: [facility];  Service: Gynecology;  Laterality: N/A;   DILATION AND EVACUATION N/A 04/25/2017   Procedure: DILATATION AND EVACUATION;  Surgeon: Janyth Contes, MD;  Location: [facility];  Service: Gynecology;  Laterality: N/A;   HYSTEROSCOPY WITH D & C N/A 08/12/2021   Procedure: DILATATION AND CURETTAGE /HYSTEROSCOPY;  Surgeon: Sherlyn Hay, DO;  Location: [place];  Service: Gynecology;  Laterality: N/A;    WISDOM TOOTH EXTRACTION      FAMILY HISTORY: Family History  Problem Relation Age of Onset   Hypertension Mother    Sickle cell trait Father    Hypertension Father    Thyroid disease Paternal Aunt    Cancer Other        maternal side-? type   Hyperlipidemia Other        ? side   Stroke Other        paternal side   Diabetes Other        paternal and maternal side   Breast cancer Neg Hx    Ovarian cancer Neg Hx    Colon cancer Neg Hx    Uterine cancer Neg Hx     SOCIAL HISTORY: Social History   Socioeconomic History   Marital status: Single    Spouse name: Not on file   Number of children: Not on file   Years of education: Not on file   Highest education level: Not on file  Occupational History  Not on file  Tobacco Use   Smoking status: Former    Types: Cigarettes   Smokeless tobacco: Never  Vaping Use   Vaping Use: Never used  Substance and Sexual Activity   Alcohol use: No    Alcohol/week: 1.0 standard drink of alcohol    Types: 1 Standard drinks or equivalent per week    Comment: random   Drug use: No    Comment: THC in past 30 days per pt on 12/26/21   Sexual activity: Yes    Partners: Male    Birth control/protection: None  Other Topics Concern   Not on file  Social History Narrative   Working at pharmacy, Clinical cytogeneticist.  Exercise - walking.  Has 30yo.   06/2020.   Social Determinants of Health   Financial Resource Strain: Low Risk  (09/20/2018)   Overall Financial Resource Strain (CARDIA)    Difficulty of Paying Living Expenses: Not hard at all  Food Insecurity: No Food Insecurity (09/20/2018)   Hunger Vital Sign    Worried About Running Out of Food in the Last Year: Never true    Ran Out of Food in the Last Year: Never true  Transportation Needs: Unknown (09/20/2018)   PRAPARE - Hydrologist (Medical): No    Lack of Transportation (Non-Medical): Not on file  Physical Activity: Not on file  Stress: Stress Concern Present  (09/20/2018)   [place]    Feeling of Stress : To some extent  Social Connections: Not on file  Intimate Partner Violence: Not At Risk (09/20/2018)   Humiliation, Afraid, Rape, and Kick questionnaire    Fear of Current or Ex-Partner: No    Emotionally Abused: No    Physically Abused: No    Sexually Abused: No       PHYSICAL EXAM  Vitals:   02/14/22 1426  BP: 131/87  Pulse: (!) 117  Weight: 170 lb 8 oz (77.3 kg)  Height: 5' (1.524 m)    Body mass index is 33.3 kg/m.   General: The patient is well-developed and well-nourished and in no acute distress  HEENT:  Head is [place]/AT.  Sclera are anicteric.  Funduscopic exam shows normal optic discs and retinal vessels.  Neck: No carotid bruits are noted.  The neck is nontender.  Cardiovascular: The heart has a regular rate and rhythm with a normal S1 and S2. There were no murmurs, gallops or rubs.    Skin: Extremities are without rash or  edema.  Musculoskeletal:  Back is nontender  Neurologic Exam  Mental status: The patient is alert and oriented x 3 at the time of the examination. The patient has apparent normal recent and remote memory, with an apparently normal attention span and concentration ability.   Speech is normal.  Cranial nerves: Extraocular movements are full. Pupils are equal, round, and reactive to light and accomodation.  Visual fields are full.  Facial symmetry is present. There is good facial sensation to soft touch bilaterally.Facial strength is normal.  Trapezius and sternocleidomastoid strength is normal. No dysarthria is noted.  The tongue is midline, and the patient has symmetric elevation of the soft palate. No obvious hearing deficits are noted.  Motor:  Muscle bulk is normal.   Tone is normal. Strength is  5 / 5 in all 4 extremities.   Sensory: Sensory testing is intact to pinprick, soft touch and vibration sensation in all 4  extremities.  Coordination: Cerebellar  testing reveals good finger-nose-finger and heel-to-shin bilaterally.  Gait and station: Station is normal.   Gait is normal. Tandem gait is normal. Romberg is negative.   Reflexes: Deep tendon reflexes are symmetric and normal bilaterally.   Plantar responses are flexor.    DIAGNOSTIC DATA (LABS, IMAGING, TESTING) - I reviewed patient records, labs, notes, testing and imaging myself where available.  Lab Results  Component Value Date   WBC 4.0 12/26/2021   HGB 13.4 12/26/2021   HCT 40.2 12/26/2021   MCV 80.6 12/26/2021   PLT 322 12/26/2021      Component Value Date/Time   NA 138 12/26/2021 1222   NA 136 05/05/2020 1323   K 3.9 12/26/2021 1222   CL 104 12/26/2021 1222   CO2 25 12/26/2021 1222   GLUCOSE 106 (H) 12/26/2021 1222   BUN 6 12/26/2021 1222   BUN 6 05/05/2020 1323   CREATININE 0.94 12/26/2021 1222   CREATININE 0.83 03/13/2016 0941   CALCIUM 9.5 12/26/2021 1222   PROT 8.5 (H) 12/26/2021 1222   PROT 8.4 05/05/2020 1323   ALBUMIN 4.8 12/26/2021 1222   ALBUMIN 4.7 05/05/2020 1323   AST 42 (H) 12/26/2021 1222   ALT 50 (H) 12/26/2021 1222   ALKPHOS 66 12/26/2021 1222   BILITOT 0.4 12/26/2021 1222   GFRNONAA >60 12/26/2021 1222   GFRAA 105 05/05/2020 1323   Lab Results  Component Value Date   CHOL 181 05/05/2020   HDL 37 (L) 05/05/2020   LDLCALC 126 (H) 05/05/2020   TRIG 99 05/05/2020   CHOLHDL 4.9 (H) 05/05/2020   Lab Results  Component Value Date   HGBA1C 6.7 (H) 05/05/2020   No results found for: "VITAMINB12" Lab Results  Component Value Date   TSH 3.130 06/10/2020       ASSESSMENT AND PLAN  White matter abnormality on MRI of brain  VSD (ventricular septal defect) - Plan: ECHOCARDIOGRAM COMPLETE BUBBLE STUDY  Gestational trophoblastic disease   In summary, Ms. 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  I reviewed the images and the appearance is most consistent with mild chronic microvascular ischemic change.  This could be related to her history of diabetes.  Additionally she had a ventricular septal defect in the past that was still patent as a young adult there was apparently more noteworthy when younger.  Her neurologic examination today is normal.  The appearance is not typical for demyelination and MS is very unlikely.  I recommended that she discuss her hyperglycemia with primary care as his is likely significant enough to be treated.  Additionally I will check an echocardiogram to determine if the VSD is still patent.  If so, we may want her to get an evaluation by cardiology to determine if any further evaluation or therapy is needed.  She will return to see me as needed if she has new or worsening neurologic symptoms.  Thank you for asking me to see Ms. Oyster.  Please let me know if I can be of further assistance with her or other patients in the future.   Tonique Mendonca A. Felecia Shelling, MD, [facility] 73/08/3297, 2:42 PM Certified in Neurology, Clinical Neurophysiology, Sleep Medicine and Neuroimaging  [facility] [address] [place], [place] [address] [phone]

## 2022-02-14 ENCOUNTER — Encounter: Payer: Self-pay | Admitting: Neurology

## 2022-02-14 ENCOUNTER — Ambulatory Visit: Payer: Medicaid Other | Admitting: Neurology

## 2022-02-14 VITALS — BP 131/87 | HR 117 | Ht 60.0 in | Wt 170.5 lb

## 2022-02-14 DIAGNOSIS — Q210 Ventricular septal defect: Secondary | ICD-10-CM | POA: Diagnosis not present

## 2022-02-14 DIAGNOSIS — R9082 White matter disease, unspecified: Secondary | ICD-10-CM | POA: Diagnosis not present

## 2022-02-14 DIAGNOSIS — O019 Hydatidiform mole, unspecified: Secondary | ICD-10-CM | POA: Diagnosis not present

## 2022-02-16 DIAGNOSIS — O019 Hydatidiform mole, unspecified: Principal | ICD-10-CM

## 2022-02-16 NOTE — Unmapped (Signed)
Called pt to make her aware that her Robin Gillespie has been updated with her appts

## 2022-02-17 ENCOUNTER — Ambulatory Visit
Admit: 2022-02-17 | Discharge: 2022-02-19 | Disposition: A | Discharge status: Home / Self Care | Payer: BLUE CROSS/BLUE SHIELD

## 2022-02-17 DIAGNOSIS — C7800 Secondary malignant neoplasm of unspecified lung: Secondary | ICD-10-CM | POA: Diagnosis not present

## 2022-02-17 DIAGNOSIS — T451X5A Adverse effect of antineoplastic and immunosuppressive drugs, initial encounter: Secondary | ICD-10-CM | POA: Diagnosis not present

## 2022-02-17 DIAGNOSIS — Z5111 Encounter for antineoplastic chemotherapy: Secondary | ICD-10-CM | POA: Diagnosis not present

## 2022-02-17 DIAGNOSIS — C7802 Secondary malignant neoplasm of left lung: Secondary | ICD-10-CM | POA: Diagnosis not present

## 2022-02-17 DIAGNOSIS — C549 Malignant neoplasm of corpus uteri, unspecified: Secondary | ICD-10-CM | POA: Diagnosis not present

## 2022-02-17 DIAGNOSIS — C7801 Secondary malignant neoplasm of right lung: Secondary | ICD-10-CM | POA: Diagnosis not present

## 2022-02-17 DIAGNOSIS — O019 Hydatidiform mole, unspecified: Secondary | ICD-10-CM | POA: Diagnosis not present

## 2022-02-17 DIAGNOSIS — Z79899 Other long term (current) drug therapy: Secondary | ICD-10-CM | POA: Diagnosis not present

## 2022-02-17 DIAGNOSIS — L819 Disorder of pigmentation, unspecified: Secondary | ICD-10-CM | POA: Diagnosis not present

## 2022-02-17 DIAGNOSIS — F419 Anxiety disorder, unspecified: Secondary | ICD-10-CM | POA: Diagnosis not present

## 2022-02-17 DIAGNOSIS — G620 Drug-induced polyneuropathy: Secondary | ICD-10-CM | POA: Diagnosis not present

## 2022-02-17 LAB — COMPREHENSIVE METABOLIC PANEL
ALBUMIN: 3.9 g/dL (ref 3.4–5.0)
ALKALINE PHOSPHATASE: 61 U/L (ref 46–116)
ALT (SGPT): 18 U/L (ref 10–49)
ANION GAP: 7 mmol/L (ref 5–14)
AST (SGOT): 14 U/L (ref <=34)
BILIRUBIN TOTAL: 0.4 mg/dL (ref 0.3–1.2)
BLOOD UREA NITROGEN: 9 mg/dL (ref 9–23)
BUN / CREAT RATIO: 11
CALCIUM: 9.1 mg/dL (ref 8.7–10.4)
CHLORIDE: 106 mmol/L (ref 98–107)
CO2: 27 mmol/L (ref 20.0–31.0)
CREATININE: 0.79 mg/dL
EGFR CKD-EPI (2021) FEMALE: >90 mL/min/{1.73_m2} (ref >=60)
GLUCOSE RANDOM: 152 mg/dL (ref 70–179)
POTASSIUM: 3.7 mmol/L (ref 3.4–4.8)
PROTEIN TOTAL: 7.4 g/dL (ref 5.7–8.2)
SODIUM: 140 mmol/L (ref 135–145)

## 2022-02-17 LAB — CBC W/ AUTO DIFF
BASOPHILS ABSOLUTE COUNT: 0.1 10*9/L (ref 0.0–0.1)
BASOPHILS RELATIVE PERCENT: 1.5 %
EOSINOPHILS ABSOLUTE COUNT: 0 10*9/L (ref 0.0–0.5)
EOSINOPHILS RELATIVE PERCENT: 0.9 %
HEMATOCRIT: 30.2 % — ABNORMAL LOW (ref 34.0–44.0)
HEMOGLOBIN: 10.5 g/dL — ABNORMAL LOW (ref 11.3–14.9)
LYMPHOCYTES ABSOLUTE COUNT: 1 10*9/L — ABNORMAL LOW (ref 1.1–3.6)
LYMPHOCYTES RELATIVE PERCENT: 31.2 %
MEAN CORPUSCULAR HEMOGLOBIN CONC: 34.7 g/dL (ref 32.0–36.0)
MEAN CORPUSCULAR HEMOGLOBIN: 27.5 pg (ref 25.9–32.4)
MEAN CORPUSCULAR VOLUME: 79.4 fL (ref 77.6–95.7)
MEAN PLATELET VOLUME: 8.4 fL (ref 6.8–10.7)
MONOCYTES ABSOLUTE COUNT: 0.4 10*9/L (ref 0.3–0.8)
MONOCYTES RELATIVE PERCENT: 12.5 %
NEUTROPHILS ABSOLUTE COUNT: 1.8 10*9/L (ref 1.8–7.8)
NEUTROPHILS RELATIVE PERCENT: 53.9 %
PLATELET COUNT: 299 10*9/L (ref 150–450)
RED BLOOD CELL COUNT: 3.8 10*12/L — ABNORMAL LOW (ref 3.95–5.13)
RED CELL DISTRIBUTION WIDTH: 14.7 % (ref 12.2–15.2)
WBC ADJUSTED: 3.4 10*9/L — ABNORMAL LOW (ref 3.6–11.2)

## 2022-02-17 LAB — URINALYSIS WITH MICROSCOPY
BACTERIA: NONE SEEN /HPF
BILIRUBIN UA: NEGATIVE
BLOOD UA: NEGATIVE
GLUCOSE UA: NEGATIVE
KETONES UA: NEGATIVE
NITRITE UA: NEGATIVE
PH UA: 8 (ref 5.0–9.0)
PROTEIN UA: NEGATIVE
RBC UA: 1 /HPF (ref <=4)
SPECIFIC GRAVITY UA: 1.008 (ref 1.003–1.030)
SQUAMOUS EPITHELIAL: 3 /HPF (ref 0–5)
TRANSITIONAL EPITHELIAL: <1 /HPF (ref 0–2)
UROBILINOGEN UA: 2 — AB
WBC UA: 1 /HPF (ref 0–5)

## 2022-02-17 LAB — HCG QUANTITATIVE, BLOOD: GONADOTROPIN, CHORIONIC (HCG) QUANT: <2.6 m[IU]/mL

## 2022-02-17 MED ADMIN — fosaprepitant (EMEND) 150 mg in sodium chloride (NS) 0.9 % 100 mL IVPB: 150 mg | INTRAVENOUS | @ 23:00:00 | Stop: 2022-02-17

## 2022-02-17 MED ADMIN — dexAMETHasone (DECADRON) tablet 12 mg: 12 mg | ORAL | @ 22:00:00 | Stop: 2022-02-17

## 2022-02-17 MED ADMIN — etoposide (VEPESID) 182 mg in sodium chloride NON-PVC (NS) 0.9 % 500 mL IVPB: 100 mg/m2 | INTRAVENOUS | @ 23:00:00 | Stop: 2022-02-19

## 2022-02-17 MED ADMIN — ondansetron (ZOFRAN) tablet 24 mg: 24 mg | ORAL | @ 22:00:00 | Stop: 2022-02-19

## 2022-02-17 MED ADMIN — sodium bicarbonate 100 mEq in dextrose 5 % 1,100 mL continuous infusion: 250 mL/h | INTRAVENOUS | @ 18:00:00 | Stop: 2022-02-17

## 2022-02-17 MED ADMIN — sodium bicarbonate 100 mEq in dextrose 5 % 1,100 mL continuous infusion: 150 mL/h | INTRAVENOUS | @ 22:00:00 | Stop: 2022-02-18

## 2022-02-17 MED ADMIN — diphenhydrAMINE (BENADRYL) capsule/tablet 25 mg: 25 mg | ORAL | @ 22:00:00 | Stop: 2022-02-19

## 2022-02-17 MED ADMIN — famotidine (PEPCID) tablet 40 mg: 40 mg | ORAL | @ 22:00:00 | Stop: 2022-02-19

## 2022-02-17 NOTE — Unmapped (Signed)
Gyn Oncology Admission History and Physical     Assessment/Plan:    Robin Gillespie is a 31 y.o. 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     Onc: Gestational Trophoblastic Neoplasm  - Remote h/o molar preg 2019 followed to hCG undetectable x 6 months. Most recent preg > term CS 02/2021 (normal placental path)  - 04/07/2021: Px for IUD w/ +UPT; hCG persistently elevated > D&C 5/5 > negative pathology   - Last hCG 11/3: 4.5 (max 2739 on 7/14) > hCG ordered with admit labs  - 9/22 CT CAP: Heterogenous uterus w/ myometrium up to 5 cm, solid pulm nodules in RLL and lingula c/w metastatic disease  - 10/1 MRI Brain: incidental scatter foci of T2 nonspecific > referred to Neurology on 10/4.   - Strongly desires fertility preservation. Risk factors include GTN remote from term pregnancy.  - Plan for 3 cycles of EMA (D1-2) then outpatient CO (D8)  (D1-2) on Fri/Sat's with outpatient CO (D8) on Fridays.   - Plan for home administration of Neupogen 300 mcg every day x 3 starting 24 hrs after completion of inpatient chemo (D3-5). [facility]. Patient will need additional needles and syringes for her Neupogen.  - Will plan for discharge with remaining leucovorin (q12h x 4 starting 24hrs after methotrexate dose), as well as Zofran 8mg  PO TID PRN and Compazine 10 mg PO q6h PRN.     Anxiety  - Anxiety can precipitate nausea/vomiting; Ativan and Atarax ordered PRN      PPx: SCDs, ambulation, IS, Lovenox ordered for    Discussed w/ Dr. Hubert Azure and attending Dr. Chestine Spore, who was in agreement with this plan.    Chief Complaint: EMA-CO chemotherapy    History of Present Illness:   Robin Gillespie is a 31 y.o. 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 She will be undergoing her second round of chemotherapy during this admission.     The patient has a history of molar pregnancy, confirmed on pathology on 05/07/2017 following D&C. Her beta quant was followed until reaching zero, and then subsequently followed every month for 6 months. Since this event, the patient has had two term pregnancies with most recent pregnancy ending in term delivery via cesarean section in 02/2021 and negative placental pathology.     She presented for IUD placement in January 2023 and had a positive beta hCG with continued rise to a maximum value of 2739 on 10/21/2021, most recently down trended to 742 on 12/09/2021 without intervention. She had a dilation and curettage in May with negative pathology and negative in office ultrasound. Following this procedure, she was started on Loestrin birth control pills, and has only had one period in June 2023. Given this persistent detectable hCG for over 6 months, she underwent a work up with CT Chest/Abdomen/Pelvis which showed heterogeneous appearance of the uterus and solid pulmonary nodules in the right lower lobe and lingula. MRI Brain did not show evidence of metastatic disease but did show a small amount of nonspecific scattered foci of T2 hyperintensity within the white matter of the cerebral hemispheres more pronounced than expected for age. A plan was made for chemotherapy.  She received Cycle 1 of her chemotherapy on 01/20/22 and Cycle 2 on 02/03/22. She tolerated chemotherapy well and reported that her only side effect was a spicy sensation in her throat, but this disappeared shortly after. Patient was able to receive her Neupogen following the most recent cycle of chemotherapy. She has had some tingling in the tips of her fingers, but denies no other neuropathic symptoms. Recently, she shaved off her hair due to alopecia from the cancer. Oncology History    No history exists.       Active Problems:    * No active hospital problems. *      Past Medical History:   Diagnosis Date    Gestational trophoblastic disease          Past Surgical History:   Procedure Laterality Date    CESAREAN SECTION      IR INSERT PORT AGE GREATER THAN 5 YRS  01/12/2022    IR INSERT PORT AGE GREATER THAN 5 YRS 01/12/2022 [name], MD [facility]       OB History   No obstetric history on file.       Social History     Socioeconomic History    Marital status: Single   Tobacco Use    Smoking status: Never    Smokeless tobacco: Never       No family history on file.      Review of systems: Review of Systems - Negative except anxiety and pain at port site s/p recent port placement.      Labs: Prechemo labs were drawn on 10/13 and are pending.   All lab results last 24 hours:    No results found for this or any previous visit (from the past 24 hour(s)).      PE:   BP 138/89  - Pulse 96  - Temp 36.8 ??C (98.3 ??F) (Oral)  - Resp 18  - Ht 154 cm (5' 0.63)  - Wt 77.4 kg (170 lb 9.6 oz)  - SpO2 100%  - BMI 32.63 kg/m??     -General:   Well-appearing in NAD.  -Lymph:   No marked lymphadenopathy.  -CV:    RRR.   -Pulm:   CTAB. Good air movement. Normal WOB.  -Abd:    Non-tender, non-distended abdomen. No guarding, no rebound. Normal bowel sounds.   -Extremities:   No clubbing, erythema, or edema. Decreased sensation of the DIP of the L 2nd and 3rd finger, and DIP of the R 3rd and 4th finger, otherwise normal sensation.    -Psych:   Appropriate.The patient is awake and alert.  -GU:    Deferred.  Neuro:   Normal motor function, normal DTRs   Skin:    Spots of hyperpigmentation on bilateral anticubital fossa and on the anterior aspect of the left arm.

## 2022-02-18 LAB — URINALYSIS WITH MICROSCOPY
BILIRUBIN UA: NEGATIVE
BLOOD UA: NEGATIVE
KETONES UA: NEGATIVE
LEUKOCYTE ESTERASE UA: NEGATIVE
NITRITE UA: NEGATIVE
PH UA: 7.5 (ref 5.0–9.0)
PROTEIN UA: NEGATIVE
RBC UA: <1 /HPF (ref <=4)
SPECIFIC GRAVITY UA: 1.009 (ref 1.003–1.030)
SQUAMOUS EPITHELIAL: 6 /HPF — ABNORMAL HIGH (ref 0–5)
TRANSITIONAL EPITHELIAL: <1 /HPF (ref 0–2)
UROBILINOGEN UA: <2
WBC UA: <1 /HPF (ref 0–5)

## 2022-02-18 LAB — CBC
HEMATOCRIT: 31.1 % — ABNORMAL LOW (ref 34.0–44.0)
HEMOGLOBIN: 10.7 g/dL — ABNORMAL LOW (ref 11.3–14.9)
MEAN CORPUSCULAR HEMOGLOBIN CONC: 34.4 g/dL (ref 32.0–36.0)
MEAN CORPUSCULAR HEMOGLOBIN: 27.6 pg (ref 25.9–32.4)
MEAN CORPUSCULAR VOLUME: 80.1 fL (ref 77.6–95.7)
MEAN PLATELET VOLUME: 8.9 fL (ref 6.8–10.7)
PLATELET COUNT: 366 10*9/L (ref 150–450)
RED BLOOD CELL COUNT: 3.88 10*12/L — ABNORMAL LOW (ref 3.95–5.13)
RED CELL DISTRIBUTION WIDTH: 14.6 % (ref 12.2–15.2)
WBC ADJUSTED: 4 10*9/L (ref 3.6–11.2)

## 2022-02-18 LAB — BASIC METABOLIC PANEL
ANION GAP: 8 mmol/L (ref 5–14)
BLOOD UREA NITROGEN: 8 mg/dL — ABNORMAL LOW (ref 9–23)
BUN / CREAT RATIO: 10
CALCIUM: 9.1 mg/dL (ref 8.7–10.4)
CHLORIDE: 109 mmol/L — ABNORMAL HIGH (ref 98–107)
CO2: 25 mmol/L (ref 20.0–31.0)
CREATININE: 0.77 mg/dL
EGFR CKD-EPI (2021) FEMALE: >90 mL/min/{1.73_m2} (ref >=60)
GLUCOSE RANDOM: 142 mg/dL (ref 70–179)
POTASSIUM: 3.6 mmol/L (ref 3.4–4.8)
SODIUM: 142 mmol/L (ref 135–145)

## 2022-02-18 LAB — MAGNESIUM: MAGNESIUM: 1.4 mg/dL — ABNORMAL LOW (ref 1.6–2.6)

## 2022-02-18 LAB — PHOSPHORUS: PHOSPHORUS: 1.6 mg/dL — ABNORMAL LOW (ref 2.4–5.1)

## 2022-02-18 MED ADMIN — ondansetron (ZOFRAN) tablet 24 mg: 24 mg | ORAL | @ 23:00:00 | Stop: 2022-02-18

## 2022-02-18 MED ADMIN — gabapentin (NEURONTIN) capsule 300 mg: 300 mg | ORAL | @ 02:00:00

## 2022-02-18 MED ADMIN — dexAMETHasone (DECADRON) tablet 8 mg: 8 mg | ORAL | @ 23:00:00 | Stop: 2022-02-18

## 2022-02-18 MED ADMIN — diphenhydrAMINE (BENADRYL) capsule/tablet 25 mg: 25 mg | ORAL | @ 23:00:00 | Stop: 2022-02-18

## 2022-02-18 MED ADMIN — methotrexate (Preservative Free) 546 mg in sodium chloride (NS) 0.9 % 1,000 mL IVPB: 300 mg/m2 | INTRAVENOUS | @ 01:00:00

## 2022-02-18 MED ADMIN — DACTINomycin (COSMEGEN) syringe: .5 mg | INTRAVENOUS | Stop: 2022-02-19

## 2022-02-18 MED ADMIN — enoxaparin (LOVENOX) syringe 40 mg: 40 mg | SUBCUTANEOUS | @ 02:00:00

## 2022-02-18 MED ADMIN — etoposide (VEPESID) 182 mg in sodium chloride NON-PVC (NS) 0.9 % 500 mL IVPB: 100 mg/m2 | INTRAVENOUS | @ 23:00:00 | Stop: 2022-02-18

## 2022-02-18 MED ADMIN — famotidine (PEPCID) tablet 40 mg: 40 mg | ORAL | @ 23:00:00 | Stop: 2022-02-18

## 2022-02-18 NOTE — Unmapped (Shared)
GYN ONC PROGRESS NOTE    ASSESSMENT AND PLAN     Robin Gillespie is a 31 y.o. 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     Onc: Gestational Trophoblastic Neoplasm  - Remote h/o molar preg 2019 followed to hCG undetectable x 6 months. Most recent preg > term CS 02/2021 (normal placental path)  - 04/07/2021: Px for IUD w/ +UPT; hCG persistently elevated > D&C 5/5 > negative pathology   - Last hCG 11/3: 4.5 (max 2739 on 7/14) > hCG 2.6 on admit  - 9/22 CT CAP: Heterogenous uterus w/ myometrium up to 5 cm, solid pulm nodules in RLL and lingula c/w metastatic disease  - 10/1 MRI Brain: incidental scatter foci of T2 nonspecific > referred to Neurology on 10/4.   - Strongly desires fertility preservation. Risk factors include GTN remote from term pregnancy.  - Plan for 3 cycles of EMA (D1-2) then outpatient CO (D8)  (D1-2) on Fri/Sat's with outpatient CO (D8) on Fridays.   - Plan for home administration of Neupogen 300 mcg every day x 3 starting 24 hrs after completion of inpatient chemo (D3-5). [facility]. Patient will need additional needles and syringes for her Neupogen.  - Will plan for discharge with remaining leucovorin (q12h x 4 starting 24hrs after methotrexate dose), as well as Zofran 8mg  PO TID PRN and Compazine 10 mg PO q6h PRN.      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 Marland Kitchen and Attending Dr. Marland Kitchen    SUBJECTIVE     ***    OBJECTIVE     Temp:  [36.3 ??C (97.4 ??F)-36.8 ??C (98.3 ??F)] 36.3 ??C (97.4 ??F)  Heart Rate:  [70-96] 70  Resp:  [17-18] 17  BP: (113-138)/(53-89) 113/53  SpO2:  [100 %] 100 %    Gen: NAD, awake, alert, oriented  CV: regular rate and rhythm   Pulm: clear to auscultation bilaterally, normal work of breathing  Abd: Soft, appropriately tender to palpation, non-distended, bowel sounds present  GU: Foley in place with clear yellow*** urine in bag    Medications (scheduled)    DACTINomycin  0.5 mg Intravenous Q24H    dexAMETHasone  8 mg Oral Q24H    diphenhydrAMINE  25 mg Oral Q24H    enoxaparin (LOVENOX) injection  40 mg Subcutaneous Q24H    etoposide  100 mg/m2 (Order-Specific) Intravenous Q24H    famotidine  40 mg Oral Q24H    gabapentin  300 mg Oral Nightly    leuCOVorin  15 mg Oral Q12H    methotrexate (Preservative Free) 546 mg in sodium chloride (NS) 0.9 % 1,000 mL IVPB  300 mg/m2 (Order-Specific) Intravenous Once    ondansetron  24 mg Oral Q24H       Medications (prn)  acetaminophen, dexAMETHasone, diphenhydrAMINE, EPINEPHrine IM, famotidine (PEPCID) IV, hydrOXYzine, IP okay to treat, LORazepam, LORazepam, methylPREDNISolone sodium succinate, ondansetron, ondansetron, oxyCODONE **OR** oxyCODONE, prochlorperazine (COMPAZINE) 10 mg in sodium chloride (NS) 0.9 % 25 mL IVPB, prochlorperazine, promethazine, promethazine, senna, sodium chloride, sodium chloride 0.9%    Labs:   Lab Results  Component Value Date    WBC 3.4 (L) 02/17/2022    HGB 10.5 (L) 02/17/2022    HCT 30.2 (L) 02/17/2022    PLT 299 02/17/2022       Lab Results   Component Value Date    NA 140 02/17/2022    K 3.7 02/17/2022    CL 106 02/17/2022    CO2 27.0 02/17/2022    BUN 9 02/17/2022    CREATININE 0.79 02/17/2022    GLU 152 02/17/2022    CALCIUM 9.1 02/17/2022    MG 1.4 (L) 02/05/2022    PHOS 3.0 02/05/2022       Lab Results   Component Value Date    BILITOT 0.4 02/17/2022    PROT 7.4 02/17/2022    ALBUMIN 3.9 02/17/2022    ALT 18 02/17/2022    AST 14 02/17/2022    ALKPHOS 61 02/17/2022       No results found for: LABPROT, INR, APTT     Scribe's Attestation: ***, MD obtained and performed the history, physical exam and medical decision making elements that were  entered into the chart. Documentation assistance was provided by me personally, a scribe. Signed by Wyvonnia Lora Scribe, on February 18, 2022 at 5:20 AM.

## 2022-02-18 NOTE — Unmapped (Signed)
VSS. A+Ox4. Pt. Tolerated end of etoposide infusion, Dactinomycin, and methotrexate currently hanging. Adequate UOP. Denies nausea/vomiting/pain. No s/s of rxn. Pt. Resting w/ equal and unlabored respirations. Family at bedside. Bed is low, locked, and call bell is within reach.     Problem: Adult Inpatient Plan of Care  Goal: Plan of Care Review  Outcome: Progressing  Goal: Patient-Specific Goal (Individualized)  Outcome: Progressing  Goal: Absence of Hospital-Acquired Illness or Injury  Outcome: Progressing  Intervention: Identify and Manage Fall Risk  Recent Flowsheet Documentation  Taken 02/17/2022 1901 by Simone Curia, RN  Safety Interventions:   fall reduction program maintained   lighting adjusted for tasks/safety   low bed   nonskid shoes/slippers when out of bed  Intervention: Prevent Skin Injury  Recent Flowsheet Documentation  Taken 02/17/2022 2116 by Simone Curia, RN  Positioning for Skin: Supine/Back  Taken 02/17/2022 1901 by Simone Curia, RN  Positioning for Skin: Supine/Back  Intervention: Prevent and Manage VTE (Venous Thromboembolism) Risk  Recent Flowsheet Documentation  Taken 02/17/2022 1901 by Simone Curia, RN  Anti-Embolism Intervention: (lovenox) Other (Comment)  Goal: Optimal Comfort and Wellbeing  Outcome: Progressing

## 2022-02-18 NOTE — Unmapped (Signed)
Patient remains admitted to receive Low Dose EMA-CO for Gestational Trophoblastic Neoplasm. Methotrexate infusion completed  at 0733 this shift. VSS. No acute events. Patient continue on on chemo precaution per protocol. Ambulatory in room and independent with her ADLs. Had shower today. Port a cath remains intact with no signs of infection. Plan of care discussed and verbalized good understanding. Plan of care ongoing.  Problem: Adult Inpatient Plan of Care  Goal: Plan of Care Review  Outcome: Progressing       Plan of Care Reviewed With: patient     Problem: Adult Inpatient Plan of Care  Goal: Patient-Specific Goal (Individualized)  Outcome: Progressing     Problem: Adult Inpatient Plan of Care  Goal: Absence of Hospital-Acquired Illness or Injury  Outcome: Progressing     Problem: Adult Inpatient Plan of Care  Goal: Absence of Hospital-Acquired Illness or Injury  Intervention: Prevent Skin Injury  Positioning for Skin: Supine/Back     Problem: Adult Inpatient Plan of Care  Goal: Optimal Comfort and Wellbeing  Outcome: Progressing     Problem: Adult Inpatient Plan of Care  Goal: Readiness for Transition of Care  Outcome: Progressing

## 2022-02-18 NOTE — Unmapped (Signed)
VSS. Patient was a direct admit from home for chemotherapy. Chemo precautions in place. Port accessed with brisk blood return. Labs and urine collected as per order. Patient currently in room with mother at bedside. Call bell within reach, no needs mentioned at this time.     Problem: Adult Inpatient Plan of Care  Goal: Plan of Care Review  Outcome: Ongoing - Unchanged  Goal: Patient-Specific Goal (Individualized)  Outcome: Ongoing - Unchanged  Goal: Absence of Hospital-Acquired Illness or Injury  Outcome: Ongoing - Unchanged  Intervention: Identify and Manage Fall Risk  Recent Flowsheet Documentation  Taken 02/17/2022 1126 by Marton Redwood, RN  Safety Interventions:   fall reduction program maintained   family at bedside   lighting adjusted for tasks/safety   low bed   nonskid shoes/slippers when out of bed  Intervention: Prevent and Manage VTE (Venous Thromboembolism) Risk  Recent Flowsheet Documentation  Taken 02/17/2022 1500 by Marton Redwood, RN  Anti-Embolism Intervention: (pharm proph) Other (Comment)  Taken 02/17/2022 1300 by Marton Redwood, RN  Anti-Embolism Intervention: (pharm proph) Other (Comment)  Taken 02/17/2022 1126 by Marton Redwood, RN  Anti-Embolism Intervention: (pharm proph) Other (Comment)  Goal: Optimal Comfort and Wellbeing  Outcome: Ongoing - Unchanged  Goal: Readiness for Transition of Care  Outcome: Ongoing - Unchanged  Goal: Rounds/Family Conference  Outcome: Ongoing - Unchanged

## 2022-02-19 DIAGNOSIS — T451X5D Adverse effect of antineoplastic and immunosuppressive drugs, subsequent encounter: Secondary | ICD-10-CM | POA: Diagnosis not present

## 2022-02-19 DIAGNOSIS — G620 Drug-induced polyneuropathy: Secondary | ICD-10-CM | POA: Diagnosis not present

## 2022-02-19 DIAGNOSIS — O019 Hydatidiform mole, unspecified: Secondary | ICD-10-CM | POA: Diagnosis not present

## 2022-02-19 DIAGNOSIS — L810 Postinflammatory hyperpigmentation: Secondary | ICD-10-CM | POA: Diagnosis not present

## 2022-02-19 DIAGNOSIS — F419 Anxiety disorder, unspecified: Secondary | ICD-10-CM | POA: Diagnosis not present

## 2022-02-19 LAB — BASIC METABOLIC PANEL
ANION GAP: 3 mmol/L — ABNORMAL LOW (ref 5–14)
BLOOD UREA NITROGEN: 10 mg/dL (ref 9–23)
BUN / CREAT RATIO: 12
CALCIUM: 9.2 mg/dL (ref 8.7–10.4)
CHLORIDE: 111 mmol/L — ABNORMAL HIGH (ref 98–107)
CO2: 27 mmol/L (ref 20.0–31.0)
CREATININE: 0.85 mg/dL
EGFR CKD-EPI (2021) FEMALE: >90 mL/min/{1.73_m2} (ref >=60)
GLUCOSE RANDOM: 232 mg/dL — ABNORMAL HIGH (ref 70–179)
POTASSIUM: 4 mmol/L (ref 3.4–4.8)
SODIUM: 141 mmol/L (ref 135–145)

## 2022-02-19 LAB — URINALYSIS WITH MICROSCOPY
BACTERIA: NONE SEEN /HPF
BILIRUBIN UA: NEGATIVE
BLOOD UA: NEGATIVE
GLUCOSE UA: NEGATIVE
KETONES UA: NEGATIVE
LEUKOCYTE ESTERASE UA: NEGATIVE
NITRITE UA: NEGATIVE
PH UA: 7.5 (ref 5.0–9.0)
PROTEIN UA: NEGATIVE
RBC UA: <1 /HPF (ref <=4)
SPECIFIC GRAVITY UA: 1.011 (ref 1.003–1.030)
SQUAMOUS EPITHELIAL: <1 /HPF (ref 0–5)
UROBILINOGEN UA: <2
WBC UA: <1 /HPF (ref 0–5)

## 2022-02-19 LAB — CBC
HEMATOCRIT: 28.7 % — ABNORMAL LOW (ref 34.0–44.0)
HEMOGLOBIN: 9.8 g/dL — ABNORMAL LOW (ref 11.3–14.9)
MEAN CORPUSCULAR HEMOGLOBIN CONC: 34.1 g/dL (ref 32.0–36.0)
MEAN CORPUSCULAR HEMOGLOBIN: 27.4 pg (ref 25.9–32.4)
MEAN CORPUSCULAR VOLUME: 80.4 fL (ref 77.6–95.7)
MEAN PLATELET VOLUME: 9 fL (ref 6.8–10.7)
PLATELET COUNT: 358 10*9/L (ref 150–450)
RED BLOOD CELL COUNT: 3.58 10*12/L — ABNORMAL LOW (ref 3.95–5.13)
RED CELL DISTRIBUTION WIDTH: 14.7 % (ref 12.2–15.2)
WBC ADJUSTED: 3.5 10*9/L — ABNORMAL LOW (ref 3.6–11.2)

## 2022-02-19 LAB — PHOSPHORUS: PHOSPHORUS: 3 mg/dL (ref 2.4–5.1)

## 2022-02-19 LAB — MAGNESIUM: MAGNESIUM: 1.7 mg/dL (ref 1.6–2.6)

## 2022-02-19 MED ORDER — BD LUER-LOK SYRINGE 3 ML 25 X 5/8"
Freq: Two times a day (BID) | 3 refills | 14 days | Status: CP | PRN
Start: 2022-02-19 — End: ?
  Filled 2022-03-04: qty 6, 14d supply, fill #0

## 2022-02-19 MED ORDER — PROCHLORPERAZINE MALEATE 10 MG TABLET
ORAL_TABLET | Freq: Four times a day (QID) | ORAL | 0 refills | 8 days | Status: CP | PRN
Start: 2022-02-19 — End: 2022-02-27

## 2022-02-19 MED ORDER — ONDANSETRON HCL 4 MG TABLET
ORAL_TABLET | Freq: Three times a day (TID) | ORAL | 0 refills | 5 days | Status: CP | PRN
Start: 2022-02-19 — End: 2022-02-26

## 2022-02-19 MED ORDER — GABAPENTIN 300 MG CAPSULE
ORAL_CAPSULE | Freq: Every evening | ORAL | 3 refills | 30 days | Status: CP
Start: 2022-02-19 — End: 2022-06-19
  Filled 2022-02-19: qty 30, 30d supply, fill #0

## 2022-02-19 MED ORDER — PROMETHAZINE 25 MG TABLET
ORAL_TABLET | Freq: Four times a day (QID) | ORAL | 0 refills | 8 days | Status: CP | PRN
Start: 2022-02-19 — End: 2022-02-26

## 2022-02-19 MED ORDER — LEUCOVORIN CALCIUM 15 MG TABLET
ORAL_TABLET | Freq: Two times a day (BID) | ORAL | 0 refills | 2 days | Status: CP
Start: 2022-02-19 — End: 2022-02-21
  Filled 2022-02-19: qty 3, 2d supply, fill #0

## 2022-02-19 MED ORDER — SENNOSIDES 8.6 MG TABLET
ORAL_TABLET | Freq: Every evening | ORAL | 0 refills | 15 days | Status: CP | PRN
Start: 2022-02-19 — End: 2022-03-21
  Filled 2022-02-19: qty 30, 15d supply, fill #0

## 2022-02-19 MED ADMIN — enoxaparin (LOVENOX) syringe 40 mg: 40 mg | SUBCUTANEOUS | @ 03:00:00

## 2022-02-19 MED ADMIN — leuCOVorin (WELLCOVORIN) tablet 15 mg: 15 mg | ORAL | @ 01:00:00 | Stop: 2022-02-20

## 2022-02-19 MED ADMIN — gabapentin (NEURONTIN) capsule 300 mg: 300 mg | ORAL | @ 03:00:00

## 2022-02-19 MED ADMIN — leuCOVorin (WELLCOVORIN) tablet 15 mg: 15 mg | ORAL | @ 12:00:00 | Stop: 2022-02-19

## 2022-02-19 MED ADMIN — heparin, porcine (PF) 100 unit/mL injection 500 Units: 500 [IU] | INTRAVENOUS | @ 15:00:00 | Stop: 2022-02-19

## 2022-02-19 MED ADMIN — DACTINomycin (COSMEGEN) syringe: .5 mg | INTRAVENOUS | Stop: 2022-02-18

## 2022-02-19 NOTE — Unmapped (Signed)
Vitals stable and patient remains afebrile. Patient with no complaints overnight. First dose of po leucovorin given as ordered. Patient tolerating diet, voiding adequately and had BM on day shift per her report. Port a cath with brisk blood return and flushes without difficulty. No falls or injuries have occurred. Patient updated on plan of care and is without questions.   Problem: Adult Inpatient Plan of Care  Goal: Plan of Care Review  Outcome: Ongoing - Unchanged  Goal: Patient-Specific Goal (Individualized)  Outcome: Ongoing - Unchanged  Goal: Absence of Hospital-Acquired Illness or Injury  Outcome: Ongoing - Unchanged  Intervention: Identify and Manage Fall Risk  Recent Flowsheet Documentation  Taken 02/18/2022 1905 by Wells Guiles, RN  Safety Interventions:   chemotherapeutic agent precautions   family at bedside   infection management   lighting adjusted for tasks/safety   low bed   nonskid shoes/slippers when out of bed   room near unit station  Intervention: Prevent and Manage VTE (Venous Thromboembolism) Risk  Recent Flowsheet Documentation  Taken 02/19/2022 0100 by Wells Guiles, RN  Anti-Embolism Intervention: (pt on lovenox SQ) Other (Comment)  Taken 02/18/2022 2300 by Wells Guiles, RN  Anti-Embolism Intervention: (pt on lovenox SQ) Other (Comment)  Taken 02/18/2022 2100 by Wells Guiles, RN  Anti-Embolism Intervention: (pt on lovenox SQ) Other (Comment)  Taken 02/18/2022 1905 by Wells Guiles, RN  Anti-Embolism Intervention: (pt on lovenox SQ) Other (Comment)  Intervention: Prevent Infection  Recent Flowsheet Documentation  Taken 02/18/2022 1905 by Wells Guiles, RN  Infection Prevention:   cohorting utilized   environmental surveillance performed   equipment surfaces disinfected   hand hygiene promoted   personal protective equipment utilized   rest/sleep promoted   visitors restricted/screened   single patient room provided  Goal: Optimal Comfort and Wellbeing  Outcome: Ongoing - Unchanged  Goal: Readiness for Transition of Care  Outcome: Ongoing - Unchanged  Goal: Rounds/Family Conference  Outcome: Ongoing - Unchanged

## 2022-02-19 NOTE — Unmapped (Signed)
GYN ONC PROGRESS NOTE    ASSESSMENT AND PLAN     Robin Gillespie is a 31 y.o. 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     Onc: Gestational Trophoblastic Neoplasm  - H/o molar preg 2019 followed to hCG undetectable x 6 months. Most recent preg > term CS 02/2021 (normal placental path) > D&C 5/5 for hCG persistently elevated > negative pathology   - Last hCG 11/3: 4.5 (max 2739 on 7/14) > hCG 2.6 on admit  - 9/22 CT CAP: Heterogenous uterus w/ myometrium up to 5 cm, solid pulm nodules in RLL and lingula c/w metastatic disease  - 10/1 MRI Brain: incidental scatter foci of T2 nonspecific > referred to Neurology on 10/4.   - Strongly desires fertility preservation. Risk factors include GTN remote from term pregnancy.  - Plan for 3C of inpt EMA (D1-2) > outpt CO (D8)  - Plan for home administration of Neupogen 300 mcg every day x 3 starting 24 hrs after completion of inpatient chemo (D3-5). Patient states received Neupogen.  - Will plan for discharge with remaining Leucovorin (q12h x 4 starting 24hrs after methotrexate dose), as well as Zofran 8mg  PO TID PRN and Compazine 10 mg PO q6h PRN.      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 Jonny Ruiz and Attending Dr.Clark.    SUBJECTIVE     Patient states she is doing well, tolerated chemotherapy yesterday without any issues. She states only symptoms she is having this AM is minimal tingling in her fingertips. She states she has her Neupogen at home and has all the PRN medication she needs. She is tolerating po diet and denies any nausea and vomiting.    OBJECTIVE     Temp:  [36.5 ??C (97.7 ??F)-37 ??C (98.6 ??F)] 36.5 ??C (97.7 ??F)  Heart Rate:  [76-91] 76  Resp:  [18] 18  BP: (99-130)/(53-77) 99/53  SpO2:  [100 %] 100 %    Gen: NAD, awake, alert, oriented  CV: regular rate and rhythm   Pulm: clear to auscultation bilaterally, normal work of breathing  Abd: Soft, appropriately tender to palpation, non-distended, bowel sounds present  Extremities:  No clubbing, erythema, or edema.   GU: deferred    Medications (scheduled)    enoxaparin (LOVENOX) injection  40 mg Subcutaneous Q24H    gabapentin  300 mg Oral Nightly    leuCOVorin  15 mg Oral Q12H       Medications (prn)  acetaminophen, dexAMETHasone, diphenhydrAMINE, EPINEPHrine IM, famotidine (PEPCID) IV, hydrOXYzine, IP okay to treat, LORazepam, LORazepam, methylPREDNISolone sodium succinate, ondansetron, ondansetron, oxyCODONE **OR** oxyCODONE, prochlorperazine (COMPAZINE) 10 mg in sodium chloride (NS) 0.9 % 25 mL IVPB, prochlorperazine, promethazine, promethazine, senna, sodium chloride, sodium chloride 0.9%    Labs:   Lab Results   Component Value Date    WBC 3.5 (L) 02/19/2022    HGB 9.8 (L) 02/19/2022    HCT 28.7 (L) 02/19/2022    PLT 358 02/19/2022       Lab Results   Component Value Date    NA 141 02/19/2022    K 4.0 02/19/2022  CL 111 (H) 02/19/2022    CO2 27.0 02/19/2022    BUN 10 02/19/2022    CREATININE 0.85 02/19/2022    GLU 232 (H) 02/19/2022    CALCIUM 9.2 02/19/2022    MG 1.7 02/19/2022    PHOS 3.0 02/19/2022       Lab Results   Component Value Date    BILITOT 0.4 02/17/2022    PROT 7.4 02/17/2022    ALBUMIN 3.9 02/17/2022    ALT 18 02/17/2022    AST 14 02/17/2022    ALKPHOS 61 02/17/2022       No results found for: LABPROT, INR, APTT     Scribe's Attestation: Sharol Harness, MD obtained and performed the history, physical exam and medical decision making elements that were  entered into the chart. Documentation assistance was provided by me personally, a scribe. Signed by Marcy Siren Scribe, on February 19, 2022 at 6:39 AM. ----------------------------------------------------------------------------------------------------------------------  February 19, 2022 7:08 AM. Documentation assistance provided by the Scribe. I was present during the time the encounter was recorded. The information recorded by the Scribe was done at my direction and has been reviewed and validated by me.  ----------------------------------------------------------------------------------------------------------------------     Sharol Harness PGY- 3  Obstetrics and Gynecology

## 2022-02-19 NOTE — Unmapped (Addendum)
Robin Gillespie is a 31 y.o. female admitted on 02/17/2022 with at least stage III:5 gestational trophoblastic neoplasm admitted for cycle 3 of EMA-CO. Her hospital course is outlined by pertinent problem below.     Onc: Gestational Trophoblastic Neoplasm   The patient had a history of molar pregnancy in 2019 followed to hCG undetectable x after 6 months. Her most recent pregnancy term CS 02/2021 (normal placental path), she had a dilation and curettage for hCG persistently elevated, negative pathology. The patient's last hCG 11/3 was 4.5 (max 2739 on 7/14), her hCG on admission was 2.6. Her last imaging was a CT CAP that was notable for heterogeneous uterus with myometrium up to 5 cm, solid pulmonary nodules in right lower lung and lingula consistent with metastatic disease. Her 10/1 MRI brain was notable for incidental scattered foci of T2 nonspecific, referred to neurology on 10/4. The patient strongly desires fertility preservation, risk factors include GTN remote from term pregnancy. The patient had 3C of inpatient EMA (D1-2) and outpatient CO (D8). Planned home administration of Neupogen 300 mcg every day x3 starting 24h after completion of inpatient chemotherapy (D3-5). Patient was discharged with remaining Leucovorin (q12h x 4 starting 24h after methotrexate dose), as well as Zofran 8mg  oral TID as needed and Compazine 10 mg oral q6h as needed.     Anxiety   The patient's anxiety could precipitate nausea/vomiting. She was administered Ativan and Atarax inpatient as needed.     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     Patient was deemed stable on HD#3/(02/19/22). On day of discharge patient???s pain was well controlled on oral medications, she was tolerating a regular diet without nausea/vomiting, voiding spontaneously, having bowel movements without issue, and ambulating. She will follow up with Dr. Alvester Morin on 02/24/22 for further treatment planning.

## 2022-02-19 NOTE — Unmapped (Incomplete)
Patient received and completed Low Dose EMA-CO ( OP Pegfilgrastim ) cycle 3, day 1-2 this admission for Gestational Trophoblastic Neoplasm without reaction/side effect. No acute events throughout patient's hospitalization. Vital signs WNL. Appetite good. Voiding in bathroom without concern. Denies pain.  Patient received leucovorin po today as scheduled. Seen by GYN Onc Team with order to be discharged home. Home care instructions given, verbalized good understanding. Patient left the unit ambulatory accompanied by mother.  Problem: Adult Inpatient Plan of Care  Goal: Plan of Care Review  Outcome: Resolved     Problem: Adult Inpatient Plan of Care  Goal: Patient-Specific Goal (Individualized)  Outcome: Resolved     Problem: Adult Inpatient Plan of Care  Goal: Absence of Hospital-Acquired Illness or Injury  Outcome: Resolved     Problem: Adult Inpatient Plan of Care  Goal: Absence of Hospital-Acquired Illness or Injury  Intervention: Identify and Manage Fall Risk  Safety Interventions:  ??? low bed  ??? chemotherapeutic agent precautions     Problem: Adult Inpatient Plan of Care  Goal: Absence of Hospital-Acquired Illness or Injury  Intervention: Prevent Skin Injury  Recent Flowsheet Documentation  Positioning for Skin: Supine/Back     Problem: Adult Inpatient Plan of Care  Goal: Optimal Comfort and Wellbeing  Outcome: Resolved     Problem: Adult Inpatient Plan of Care  Goal: Readiness for Transition of Care  Outcome: Resolved     Problem: Adult Inpatient Plan of Care  Goal: Rounds/Family Conference  Outcome: Resolved     Problem: Chemotherapy Effects  Goal: Anemia Symptom Improvement  Outcome: Resolved  Intervention: Monitor and Manage Anemia  Safety Interventions:  ??? low bed  ??? chemotherapeutic agent precautions     Problem: Chemotherapy Effects  Goal: Anemia Symptom Improvement  Intervention: Monitor and Manage Anemia  Safety Interventions:  ??? low bed  ??? chemotherapeutic agent precautions     Problem: Chemotherapy Effects  Goal: Safety Maintained  Outcome: Resolved     Problem: Chemotherapy Effects  Goal: Absence of Hematuria  Outcome: Resolved     Problem: Chemotherapy Effects  Goal: Nausea and Vomiting Relief  Outcome: Resolved     Problem: Chemotherapy Effects  Goal: Nausea and Vomiting Relief  Intervention: Prevent or Manage Nausea and Vomiting  Oral Care: teeth brushed     Problem: Chemotherapy Effects  Goal: Neurotoxicity Symptom Control  Outcome: Resolved     Problem: Chemotherapy Effects  Goal: Absence of Infection  Intervention: Prevent Infection and Maximize Resistance  Perineal Care: perineal hygiene encouraged  Oral Care: teeth brushed

## 2022-02-19 NOTE — Unmapped (Signed)
Physician Discharge Summary    Admit date: 02/17/2022    Discharge date: 02/19/22    Discharge to: Home    Discharge Service: Gynecology (GYN)    Discharge Attending Physician: [name], MD    Discharge Diagnoses: At [facility] III:5 Gestational Trophoblastic Neoplasm    Hospital Course:  Robin Gillespie is a 31 y.o. female admitted on 02/17/2022 with at least stage III:5 gestational trophoblastic neoplasm admitted for cycle 3 of EMA-CO. Her hospital course is outlined by pertinent problem below.     Onc: Gestational Trophoblastic Neoplasm   The patient had a history of molar pregnancy in 2019 followed to hCG undetectable x after 6 months. Her most recent pregnancy term CS 02/2021 (normal placental path), she had a dilation and curettage for hCG persistently elevated, negative pathology. The patient's last hCG 11/3 was 4.5 (max 2739 on 7/14), her hCG on admission was 2.6. Her last imaging was a CT CAP that was notable for heterogeneous uterus with myometrium up to 5 cm, solid pulmonary nodules in right lower lung and lingula consistent with metastatic disease. Her 10/1 MRI brain was notable for incidental scattered foci of T2 nonspecific, referred to neurology on 10/4. The patient strongly desires fertility preservation, risk factors include GTN remote from term pregnancy. The patient had 3C of inpatient EMA (D1-2) and outpatient CO (D8). Planned home administration of Neupogen 300 mcg every day x3 starting 24h after completion of inpatient chemotherapy (D3-5). Patient was discharged with remaining Leucovorin (q12h x 4 starting 24h after methotrexate dose), as well as Zofran 8mg  oral TID as needed and Compazine 10 mg oral q6h as needed.     Anxiety   The patient's anxiety could precipitate nausea/vomiting. She was administered Ativan and Atarax inpatient as needed.     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     Patient was deemed stable on HD#3/(02/19/22). On day of discharge patient???s pain was well controlled on oral medications, she was tolerating a regular diet without nausea/vomiting, voiding spontaneously, having bowel movements without issue, and ambulating. She will follow up with Dr. Alvester Morin on 02/24/22 for further treatment planning.          Condition at Discharge: stable  Discharge Medications:      Your Medication List        ASK your doctor about these medications      BD LUER-LOK SYRINGE 3 mL 25 x 5/8 Syrg  Generic drug: syringe with needle  Use to inject neupogen at home under the skin     BD LUER-LOK SYRINGE 3 mL 25 x 5/8 Syrg  Generic drug: syringe with needle  Use to inject Neupogen at home under the skin.     empty container Misc  Commonly known as: sharps container  Use as directed     filgrastim 300 mcg/mL injection  Commonly known as: NEUPOGEN  Inject 1 mL (300 mcg total) under the skin 24 hours after completion chemotherapy. Repeat every 24 hours for 3 days.     NEUPOGEN 300 mcg/mL injection  Generic drug: filgrastim  Inject 300 mcg on days 4, 5, 6, 10, 11, and 12 of each cycle. Each cycle is 14 days long.     leuCOVorin 15 MG tablet  Commonly known as: WELLCOVORIN  Take 1 tablet (15 mg total) by mouth every twelve (12) hours for 3 doses.  Ask about: Should I take  this medication?     LOESTRIN FE 1/20 (28) ORAL  Take 1 tablet by mouth in the morning.     loratadine 10 mg tablet  Commonly known as: CLARITIN  Take 1 tablet (10 mg total) by mouth daily. Start taking 1 day prior to filgrastim injections and continue until 1 day after last injection     loratadine 10 mg tablet  Commonly known as: CLARITIN  Take 1 tablet (10 mg total) by mouth daily.     ondansetron 4 MG tablet  Commonly known as: ZOFRAN  Take 2 tablets (8 mg total) by mouth every eight (8) hours as needed (breakthrough nausea or vomiting unrelieved by prochlorperazine or promethazine) for up to 7 days.  Ask about: Should I take this medication?     prochlorperazine 10 MG tablet  Commonly known as: COMPAZINE  Take 1 tablet (10 mg total) by mouth every six (6) hours as needed (breakthrough nausea or vomiting) for up to 8 days.  Ask about: Should I take this medication?              Pending Test Results:       Discharge Instructions:       Appointments which have been scheduled for you      Feb 24, 2022  7:00 AM  (Arrive by 6:30 AM)  NURSE LAB DRAW with ADULT ONC LAB  [facility] ADULT ONCOLOGY LAB DRAW STATION [place] [facility] REGION) [address]  [place] [address]  [phone]        Feb 24, 2022  8:00 AM  (Arrive by 7:45 AM)  RETURN FOLLOW UP [place] with [name], MD  [facility] OBGYN GYN ONCOLOGY 1ST FLR [facility] REGION) [address]  [place] [address]  [phone]        Feb 24, 2022  9:15 AM  (Arrive by 8:45 AM)  LEVEL 180 with [employer] CHAIR 04  [place] ONCOLOGY INFUSION [place] [facility] REGION) [address]  [place] [address]  [phone]        Mar 10, 2022  9:30 AM  (Arrive by 9:00 AM)  LAB ONLY [place] with ADULT ONC LAB  [facility] ADULT ONCOLOGY LAB DRAW STATION [place] [facility] REGION) [address]  [place] [address]  [phone]        Mar 10, 2022 10:30 AM  (Arrive by 10:15 AM)  RETURN FOLLOW UP [place] with [name], MD  [facility] OBGYN GYN ONCOLOGY 1ST FLR [facility]OM[facility] REGION) [address]  [place] [address]  [phone]        Mar 10, 2022 11:30 AM  (Arrive by 11:00 AM)  LEVEL 150 with [employer] CHAIR 18  [place] ONCOLOGY INFUSION [facility] REGION) [address]  [place] [address]  [phone]                I spent greater than 30 minutes in the discharge of this patient.    Scribe's Attestation: Sharol Harness, MD obtained and performed the history, physical exam and medical decision making elements that were  entered into the chart. Documentation assistance was provided by me personally, a scribe. Signed by Marcy Siren Scribe, on February 19, 2022 at 8:08 AM.       ----------------------------------------------------------------------------------------------------------------------  February 19, 2022 8:49 AM. Documentation assistance provided by the Scribe. I was present during the time the encounter  was recorded. The information recorded by the Scribe was done at my direction and has been reviewed and validated by me.  ----------------------------------------------------------------------------------------------------------------------     Sharol Harness PGY-3  Obstetrics and Gynecology

## 2022-02-23 ENCOUNTER — Encounter (HOSPITAL_COMMUNITY): Payer: Self-pay | Admitting: Neurology

## 2022-02-23 NOTE — Unmapped (Signed)
Gynecologic Oncology Return Clinic Visit    Date of Service: 02/24/2022    Assessment & Plan:  Robin Gillespie is a 31 y.o. woman with  at least stage III:5 (possible III:7) GTD who presents for cycle 3 day 8 of EMA-CO.    GTN on EMA-CO:  - Labs reviewed and appropriate for treatment.   - Okay to proceed with treatment today.    - We will go ahead and get patient scheduled for her next inpatient chemo in 1 week.    - Reviewed that we will treat for 1 more cycle (2 cycles past negative beta-hCG).   - Patient will continue on OCPs.    Peripheral neuropathy:  - Grade 1, symptoms stable  - Continue gabapentin 300mg  nightly    Oral Mucositis  - Grade 2  - We will provide patient with instructions for baking soda mouthwashes.  - Additionally prescription sent for Magic mouthwash.  - Patient instructed to let us know if sores impact her ability to eat and drink.    RTC 2 weeks for next outpatient chemo.    Robin Cliff, MD  Gynecologic Oncology      Medical Decision Making  I have independently reviewed notes from prior visits, results from labs and imaging. The current plan of care requires intensive monitoring for toxicity due to use of anti-neoplastic treatments. Medical decision making was of high complexity due to ongoing risk of morbidity/mortality from chemotherapy treatments.      -----------------------  Reason for Visit: Chemo monitoring    Treatment History:  Patient with a remote history of molar pregnancy in 2019 followed by undetectable hCG x6 months.  Most recent term pregnancy delivered via C-section in 02/2021.  Normal placental pathology.  Following delivery, patient was scheduled for IUD placement and was noted to have a positive urine pregnancy test 04/07/2021.  Due to persistently positive hCG, she underwent a D&C in May with negative pathology.  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  Brain MRI negative for metastases.    Interval History:  Patient states she is doing well. New symptoms of mouth sores. States pain with sores 4/10 but not impacting her eating and drinking patterns. She continues to notice neuropathy in her fingertips which has remained stable. Not impacting functioning. Taking gabapentin 300mg  nightly without significant improvement.  Otherwise denies vaginal bleeding, abdominal or pelvic pain, change in bowel or bladder habits, chest pain, shortness of breath.      Past Medical/Surgical History:  Past Medical History:   Diagnosis Date    Gestational trophoblastic disease        Past Surgical History:   Procedure Laterality Date    CESAREAN SECTION      IR INSERT PORT AGE GREATER THAN 5 YRS  01/12/2022    IR INSERT PORT AGE GREATER THAN 5 YRS 01/12/2022 Robin Mcburney, MD IMG [facility]       No family history on file.    Social History     Socioeconomic History    Marital status: Single   Tobacco Use    Smoking status: Never    Smokeless tobacco: Never       Current Medications:    Current Outpatient Medications:     empty container (SHARPS CONTAINER) Misc, Use as directed, Disp: 1 each, Rfl: 2    filgrastim (NEUPOGEN) 300 mcg/mL injection, Inject 1 mL (300 mcg total) under the skin 24 hours after completion  chemotherapy. Repeat every 24 hours for 3 days., Disp: 6 mL, Rfl: 3    gabapentin (NEURONTIN) 300 MG capsule, Take 1 capsule (300 mg total) by mouth nightly., Disp: 30 capsule, Rfl: 3    loratadine (CLARITIN) 10 mg tablet, Take 1 tablet (10 mg total) by mouth daily. Start taking 1 day prior to filgrastim injections and continue until 1 day after last injection, Disp: 30 tablet, Rfl: 0    norethindrone-e.estradiol-iron (LOESTRIN FE 1/20, 28, ORAL), Take 1 tablet by mouth in the morning., Disp: , Rfl:     ondansetron (ZOFRAN) 4 MG tablet, Take 2 tablets (8 mg total) by mouth every eight (8) hours as needed (breakthrough nausea or vomiting unrelieved by prochlorperazine or promethazine) for up to 7 days., Disp: 30 tablet, Rfl: 0    prochlorperazine (COMPAZINE) 10 MG tablet, Take 1 tablet (10 mg total) by mouth every six (6) hours as needed (breakthrough nausea or vomiting) for up to 8 days., Disp: 30 tablet, Rfl: 0    promethazine (PHENERGAN) 25 MG tablet, Take 1 tablet (25 mg total) by mouth every six (6) hours as needed (breakthrough nausea or vomiting unrelieved by prochlorperazine) for up to 7 days., Disp: 30 tablet, Rfl: 0    senna (SENOKOT) 8.6 mg tablet, Take 2 tablets by mouth nightly as needed for constipation., Disp: 30 tablet, Rfl: 0    syringe with needle (BD LUER-LOK SYRINGE) 3 mL 25 x 5/8 Syrg, Use to inject neupogen at home under the skin, Disp: 6 each, Rfl: 3    magic mouthwash suspension, Swish and spit 10 mls 4 times a day as needed., Disp: 240 mL, Rfl: 0  No current facility-administered medications for this visit.    Facility-Administered Medications Ordered in Other Visits:     heparin, porcine (PF) 100 unit/mL injection 500 Units, 500 Units, Intravenous, Q30 Min PRN, Robin Cliff, MD, 500 Units at 02/24/22 1042    OKAY TO SEND MEDICATION/CHEMOTHERAPY TO OUTPATIENT UNIT, , Other, Once, Robin Cliff, MD    sodium chloride (NS) 0.9 % infusion, 100 mL/hr, Intravenous, Continuous, Robin Cliff, MD    Review of Symptoms:  Complete 10-system review is negative except as above in Interval History.    Physical Exam:  BP 128/69  - Pulse 81  - Temp 36.3 ??C (97.3 ??F) (Temporal)  - Resp 18  - Ht 152.4 cm (5')  - Wt 78.1 kg (172 lb 3.2 oz)  - SpO2 100%  - BMI 33.63 kg/m??   General: Alert, oriented, no acute distress.  HEENT: Normocephalic, atraumatic. Neck symmetric without masses. Sclera anicteric.  Superficial fissures of her lateral tongue and few healing ulcerations under her tongue.    Chest: Normal work of breathing. Clear to auscultation bilaterally. Port site clean.  Cardiovascular: Regular rate and rhythm, no murmurs.  Abdomen: Soft, nontender. Normoactive bowel sounds.    Extremities: Grossly normal range of motion.  Warm, well perfused.  No edema bilaterally.  Skin: No rashes or lesions noted.      Laboratory & Radiologic Studies:  Lab Results   Component Value Date    WBC 5.1 02/24/2022    HGB 10.6 (L) 02/24/2022    HCT 31.5 (L) 02/24/2022    PLT 400 02/24/2022    NEUTROABS 3.5 02/24/2022     Lab Results   Component Value Date    NA 138 02/24/2022    K 4.1 02/24/2022    CL 106 02/24/2022    CO2 25.0 02/24/2022    ANIONGAP 7  02/24/2022    BUN 15 02/24/2022    CREATININE 0.82 02/24/2022    BCR 18 02/24/2022    GLU 122 02/24/2022    CALCIUM 9.0 02/24/2022    ALBUMIN 3.7 02/24/2022    PROT 7.7 02/24/2022    BILITOT 0.4 02/24/2022    AST 15 02/24/2022    ALT 25 02/24/2022    ALKPHOS 66 02/24/2022     Lab Results   Component Value Date    HCGQUANT <2.6 02/24/2022    HCGQUANT <2.6 02/17/2022    HCGQUANT 4.5 02/10/2022

## 2022-02-23 NOTE — Unmapped (Incomplete)
It was a pleasure to see you in clinic today.  - ***  - Return visit planned for ***    Thank you very much for allowing me to provide care for you today.  I appreciate your confidence in choosing our Gynecologic Oncology team at [facility].  If you have any questions about your visit today please call our office or send [place] a MyChart message and we will get back to you as soon as possible.

## 2022-02-24 ENCOUNTER — Ambulatory Visit: Admit: 2022-02-24 | Discharge: 2022-02-25 | Payer: BLUE CROSS/BLUE SHIELD

## 2022-02-24 ENCOUNTER — Other Ambulatory Visit: Admit: 2022-02-24 | Discharge: 2022-02-25 | Payer: BLUE CROSS/BLUE SHIELD

## 2022-02-24 DIAGNOSIS — G629 Polyneuropathy, unspecified: Secondary | ICD-10-CM | POA: Diagnosis not present

## 2022-02-24 DIAGNOSIS — K1230 Oral mucositis (ulcerative), unspecified: Secondary | ICD-10-CM | POA: Diagnosis not present

## 2022-02-24 DIAGNOSIS — O019 Hydatidiform mole, unspecified: Secondary | ICD-10-CM | POA: Diagnosis not present

## 2022-02-24 DIAGNOSIS — Z5111 Encounter for antineoplastic chemotherapy: Secondary | ICD-10-CM | POA: Diagnosis not present

## 2022-02-24 DIAGNOSIS — Z01818 Encounter for other preprocedural examination: Principal | ICD-10-CM

## 2022-02-24 LAB — CBC W/ AUTO DIFF
BASOPHILS ABSOLUTE COUNT: 0.1 10*9/L (ref 0.0–0.1)
BASOPHILS RELATIVE PERCENT: 1.2 %
EOSINOPHILS ABSOLUTE COUNT: 0.1 10*9/L (ref 0.0–0.5)
EOSINOPHILS RELATIVE PERCENT: 1.9 %
HEMATOCRIT: 31.5 % — ABNORMAL LOW (ref 34.0–44.0)
HEMOGLOBIN: 10.6 g/dL — ABNORMAL LOW (ref 11.3–14.9)
LYMPHOCYTES ABSOLUTE COUNT: 1.2 10*9/L (ref 1.1–3.6)
LYMPHOCYTES RELATIVE PERCENT: 22.6 %
MEAN CORPUSCULAR HEMOGLOBIN CONC: 33.6 g/dL (ref 32.0–36.0)
MEAN CORPUSCULAR HEMOGLOBIN: 26.7 pg (ref 25.9–32.4)
MEAN CORPUSCULAR VOLUME: 79.3 fL (ref 77.6–95.7)
MEAN PLATELET VOLUME: 7.5 fL (ref 6.8–10.7)
MONOCYTES ABSOLUTE COUNT: 0.3 10*9/L (ref 0.3–0.8)
MONOCYTES RELATIVE PERCENT: 5 %
NEUTROPHILS ABSOLUTE COUNT: 3.5 10*9/L (ref 1.8–7.8)
NEUTROPHILS RELATIVE PERCENT: 69.3 %
PLATELET COUNT: 400 10*9/L (ref 150–450)
RED BLOOD CELL COUNT: 3.97 10*12/L (ref 3.95–5.13)
RED CELL DISTRIBUTION WIDTH: 14.6 % (ref 12.2–15.2)
WBC ADJUSTED: 5.1 10*9/L (ref 3.6–11.2)

## 2022-02-24 LAB — COMPREHENSIVE METABOLIC PANEL
ALBUMIN: 3.7 g/dL (ref 3.4–5.0)
ALKALINE PHOSPHATASE: 66 U/L (ref 46–116)
ALT (SGPT): 25 U/L (ref 10–49)
ANION GAP: 7 mmol/L (ref 5–14)
AST (SGOT): 15 U/L (ref <=34)
BILIRUBIN TOTAL: 0.4 mg/dL (ref 0.3–1.2)
BLOOD UREA NITROGEN: 15 mg/dL (ref 9–23)
BUN / CREAT RATIO: 18
CALCIUM: 9 mg/dL (ref 8.7–10.4)
CHLORIDE: 106 mmol/L (ref 98–107)
CO2: 25 mmol/L (ref 20.0–31.0)
CREATININE: 0.82 mg/dL
EGFR CKD-EPI (2021) FEMALE: >90 mL/min/{1.73_m2} (ref >=60)
GLUCOSE RANDOM: 122 mg/dL (ref 70–179)
POTASSIUM: 4.1 mmol/L (ref 3.4–4.8)
PROTEIN TOTAL: 7.7 g/dL (ref 5.7–8.2)
SODIUM: 138 mmol/L (ref 135–145)

## 2022-02-24 LAB — SLIDE REVIEW

## 2022-02-24 LAB — HCG QUANTITATIVE, BLOOD: GONADOTROPIN, CHORIONIC (HCG) QUANT: <2.6 m[IU]/mL

## 2022-02-24 MED ORDER — MAGIC MOUTH WASH SUSP (WEB)
0 refills | 0 days | Status: CP
Start: 2022-02-24 — End: ?

## 2022-02-24 MED ADMIN — vinCRIStine (ONCOVIN) 1.46 mg in sodium chloride (NS) 0.9 % 25 mL IVPB: .8 mg/m2 | INTRAVENOUS | @ 15:00:00 | Stop: 2022-02-24

## 2022-02-24 MED ADMIN — ondansetron (ZOFRAN) tablet 24 mg: 24 mg | ORAL | @ 14:00:00 | Stop: 2022-02-24

## 2022-02-24 MED ADMIN — cycloPHOSphamide (CYTOXAN) 1,092 mg in sodium chloride (NS) 0.9 % 250 mL IVPB: 600 mg/m2 | INTRAVENOUS | @ 15:00:00 | Stop: 2022-02-24

## 2022-02-24 MED ADMIN — dexAMETHasone (DECADRON) tablet 8 mg: 8 mg | ORAL | @ 14:00:00 | Stop: 2022-02-24

## 2022-02-24 MED ADMIN — heparin, porcine (PF) 100 unit/mL injection 500 Units: 500 [IU] | INTRAVENOUS | @ 16:00:00 | Stop: 2022-02-25

## 2022-02-24 MED FILL — NYSTATIN 100,000 UNIT/ML ORAL SUSPENSION, BENADRYL ALLERGY 12.5 MG/5 ML ORAL LIQUID, HYDROCORTISONE 20 MG TABLET: 5 days supply | Qty: 200 | Fill #0

## 2022-02-24 NOTE — Unmapped (Signed)
Patient tolerated Vincristine/Cyclophosphamide infusion without complication. Port hep locked and dc'ed; no sign of infiltration. No questions/concerns.

## 2022-02-24 NOTE — Unmapped (Signed)
RED ZONE Means: RED ZONE: Take action now!     You need to be seen right away  Symptoms are at a severe level of discomfort    Call 911 or go to your nearest  Hospital for help     - Bleeding that will not stop    - Hard to breathe    - New seizure - Chest pain  - Fall or passing out  -Thoughts of hurting    yourself or others      Call 911 if you are going into the RED ZONE                  YELLOW ZONE Means:     Please call with any new or worsening symptom(s), even if not on this list.  Call [phone]  After hours, weekends, and holidays - you will reach a long recording with specific instructions, If not in an emergency such as above, please listen closely all the way to the end and choose the option that relates to your need.   You can be seen by a provider the same day through our Same Day Acute Care for Patients with Cancer program.      YELLOW ZONE: Take action today     Symptoms are new or worsening  You are not within your goal range for:    - Pain    - Shortness of breath    - Bleeding (nose, urine, stool, wound)    - Feeling sick to your stomach and throwing up    - Mouth sores/pain in your mouth or throat    - Hard stool or very loose stools (increase in       ostomy output)    - No urine for 12 hours    - Feeding tube or other catheter/tube issue    - Redness or pain at previous IV or port/catheter site    - Depressed or anxiety   - Swelling (leg, arm, abdomen,     face, neck)  - Skin rash or skin changes  - Wound issues (redness, drainage,    re-opened)  - Confusion  - Vision changes  - Fever >100.4 F or chills  - Worsening cough with mucus that is    green, yellow, or bloody  - Pain or burning when going to the    bathroom  - Home Infusion Pump Issue- call    [phone]         Call your healthcare provider if you are going into the YELLOW ZONE     GREEN ZONE Means:  Your symptoms are under controls  Continue to take your medicine as ordered  Keep all visits to the provider GREEN ZONE: You are in control  No increase or worsening symptoms  Able to take your medicine  Able to drink and eat    - DO NOT use MyChart messages to report red or yellow symptoms. Allow up to 3    business days for a reply.  -MyChart is for non-urgent medication refills, scheduling requests, or other general questions.         [id number] Rev. 10/07/2021  Approved by Oncology Patient Education Committee

## 2022-03-03 ENCOUNTER — Ambulatory Visit
Admit: 2022-03-03 | Discharge: 2022-03-05 | Disposition: A | Discharge status: Home / Self Care | Payer: BLUE CROSS/BLUE SHIELD

## 2022-03-03 DIAGNOSIS — T451X5A Adverse effect of antineoplastic and immunosuppressive drugs, initial encounter: Secondary | ICD-10-CM | POA: Diagnosis not present

## 2022-03-03 DIAGNOSIS — F419 Anxiety disorder, unspecified: Secondary | ICD-10-CM | POA: Diagnosis not present

## 2022-03-03 DIAGNOSIS — C549 Malignant neoplasm of corpus uteri, unspecified: Secondary | ICD-10-CM | POA: Diagnosis not present

## 2022-03-03 DIAGNOSIS — G620 Drug-induced polyneuropathy: Secondary | ICD-10-CM | POA: Diagnosis not present

## 2022-03-03 DIAGNOSIS — Z5111 Encounter for antineoplastic chemotherapy: Secondary | ICD-10-CM | POA: Diagnosis not present

## 2022-03-03 DIAGNOSIS — C7801 Secondary malignant neoplasm of right lung: Secondary | ICD-10-CM | POA: Diagnosis not present

## 2022-03-03 DIAGNOSIS — K1230 Oral mucositis (ulcerative), unspecified: Secondary | ICD-10-CM | POA: Diagnosis not present

## 2022-03-03 DIAGNOSIS — C7802 Secondary malignant neoplasm of left lung: Secondary | ICD-10-CM | POA: Diagnosis not present

## 2022-03-03 DIAGNOSIS — G9009 Other idiopathic peripheral autonomic neuropathy: Secondary | ICD-10-CM | POA: Diagnosis not present

## 2022-03-03 DIAGNOSIS — O019 Hydatidiform mole, unspecified: Secondary | ICD-10-CM | POA: Diagnosis not present

## 2022-03-03 DIAGNOSIS — C7800 Secondary malignant neoplasm of unspecified lung: Secondary | ICD-10-CM | POA: Diagnosis not present

## 2022-03-03 DIAGNOSIS — K5900 Constipation, unspecified: Secondary | ICD-10-CM | POA: Diagnosis not present

## 2022-03-03 LAB — COMPREHENSIVE METABOLIC PANEL
ALBUMIN: 3.9 g/dL (ref 3.4–5.0)
ALKALINE PHOSPHATASE: 65 U/L (ref 46–116)
ALT (SGPT): 21 U/L (ref 10–49)
ANION GAP: 7 mmol/L (ref 5–14)
AST (SGOT): 16 U/L (ref <=34)
BILIRUBIN TOTAL: 0.4 mg/dL (ref 0.3–1.2)
BLOOD UREA NITROGEN: 10 mg/dL (ref 9–23)
BUN / CREAT RATIO: 13
CALCIUM: 9.3 mg/dL (ref 8.7–10.4)
CHLORIDE: 108 mmol/L — ABNORMAL HIGH (ref 98–107)
CO2: 24 mmol/L (ref 20.0–31.0)
CREATININE: 0.78 mg/dL
EGFR CKD-EPI (2021) FEMALE: >90 mL/min/{1.73_m2} (ref >=60)
GLUCOSE RANDOM: 139 mg/dL (ref 70–179)
POTASSIUM: 3.6 mmol/L (ref 3.4–4.8)
PROTEIN TOTAL: 7.3 g/dL (ref 5.7–8.2)
SODIUM: 139 mmol/L (ref 135–145)

## 2022-03-03 LAB — CBC W/ AUTO DIFF
BASOPHILS ABSOLUTE COUNT: 0.1 10*9/L (ref 0.0–0.1)
BASOPHILS RELATIVE PERCENT: 2.2 %
EOSINOPHILS ABSOLUTE COUNT: 0 10*9/L (ref 0.0–0.5)
EOSINOPHILS RELATIVE PERCENT: 1.2 %
HEMATOCRIT: 27.5 % — ABNORMAL LOW (ref 34.0–44.0)
HEMOGLOBIN: 9.4 g/dL — ABNORMAL LOW (ref 11.3–14.9)
LYMPHOCYTES ABSOLUTE COUNT: 0.9 10*9/L — ABNORMAL LOW (ref 1.1–3.6)
LYMPHOCYTES RELATIVE PERCENT: 25.9 %
MEAN CORPUSCULAR HEMOGLOBIN CONC: 34.1 g/dL (ref 32.0–36.0)
MEAN CORPUSCULAR HEMOGLOBIN: 27.3 pg (ref 25.9–32.4)
MEAN CORPUSCULAR VOLUME: 80.1 fL (ref 77.6–95.7)
MEAN PLATELET VOLUME: 8.4 fL (ref 6.8–10.7)
MONOCYTES ABSOLUTE COUNT: 0.6 10*9/L (ref 0.3–0.8)
MONOCYTES RELATIVE PERCENT: 17.6 %
NEUTROPHILS ABSOLUTE COUNT: 1.9 10*9/L (ref 1.8–7.8)
NEUTROPHILS RELATIVE PERCENT: 53.1 %
PLATELET COUNT: 258 10*9/L (ref 150–450)
RED BLOOD CELL COUNT: 3.43 10*12/L — ABNORMAL LOW (ref 3.95–5.13)
RED CELL DISTRIBUTION WIDTH: 15.1 % (ref 12.2–15.2)
WBC ADJUSTED: 3.6 10*9/L (ref 3.6–11.2)

## 2022-03-03 LAB — HCG QUANTITATIVE, BLOOD: GONADOTROPIN, CHORIONIC (HCG) QUANT: <2.6 m[IU]/mL

## 2022-03-03 LAB — URINALYSIS WITH MICROSCOPY
BACTERIA: NONE SEEN /HPF
BILIRUBIN UA: NEGATIVE
BLOOD UA: NEGATIVE
GLUCOSE UA: NEGATIVE
KETONES UA: NEGATIVE
LEUKOCYTE ESTERASE UA: NEGATIVE
NITRITE UA: NEGATIVE
PH UA: 8 (ref 5.0–9.0)
PROTEIN UA: NEGATIVE
RBC UA: <1 /HPF (ref <=4)
SPECIFIC GRAVITY UA: 1.011 (ref 1.003–1.030)
SQUAMOUS EPITHELIAL: 2 /HPF (ref 0–5)
UROBILINOGEN UA: <2
WBC UA: <1 /HPF (ref 0–5)

## 2022-03-03 MED ADMIN — sodium bicarbonate 100 mEq in dextrose 5 % 1,100 mL continuous infusion: 250 mL/h | INTRAVENOUS | @ 21:00:00 | Stop: 2022-03-03

## 2022-03-03 NOTE — Unmapped (Signed)
Gyn Oncology Admission History and Physical     Assessment/Plan:    Robin Gillespie is a 31 y.o. 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      Onc: Gestational Trophoblastic Neoplasm  - See HPI for full oncology history.   - Last hCG 11/17: 2.6  > hCG ordered with admit labs  - Plan for cycle 4 of EMA (D1-2) then outpatient CO (D8)  - Plan for home administration of Neupogen 300 mcg every day x 3 starting 24 hrs after completion of inpatient chemo (D3-5) and remaining leucovorin (q12h x 4 starting 24hrs after methotrexate dose)     Anxiety  - Anxiety can precipitate nausea/vomiting; Atarax ordered PRN    Peripheral neuropathy  - Grade 1, symptoms stable  - Continue gabapentin 300mg  nightly     Oral Mucositis  - Grade 1  - Patient states symptoms have resolved, Magic mouthwash ordered PRN.     PPx: SCDs, ambulation, Lovenox      Discussed w/ Dr. Chester Holstein and attending Dr. Alvester Morin, who was in agreement with this plan.     Chief Complaint: EMA-CO chemotherapy     History of Present Illness:   Robin Gillespie is a 31 y.o. 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 She will be undergoing her fourth round of chemotherapy during this admission.      The patient has a history of molar pregnancy, confirmed on pathology on 05/07/2017 following D&C. Her beta quant was followed until reaching zero, and then subsequently followed every month for 6 months. Since this event, the patient has had two term pregnancies with most recent pregnancy ending in term delivery via cesarean section in 02/2021 and negative placental pathology.      She presented for IUD placement in January 2023 and had a positive beta hCG with continued rise to a maximum value of 2739 on 10/21/2021, most recently down trended to 2.6 02/24/2022. She had a dilation and curettage in May with negative pathology and negative in office ultrasound. Following this procedure, she was started on Loestrin birth control pills, and has only had one period in June 2023. Given this persistent detectable hCG for over 6 months, she underwent a work up with CT Chest/Abdomen/Pelvis which showed heterogeneous appearance of the uterus and solid pulmonary nodules in the right lower lobe and lingula. MRI Brain did not show evidence of metastatic disease but did show a small amount of nonspecific scattered foci of T2 hyperintensity within the white matter of the cerebral hemispheres more pronounced than expected for age. A plan was made for chemotherapy with [facility]     She received Cycle 1 of her chemotherapy on 01/20/22, Cycle 2 on 02/03/22 and Cycle 3 02/17/22. She tolerated chemotherapy well and reported side effects of peripheral neuropathy and mucositis Patient states peripheral neuropathy has remained stable. She states symptoms improved with gabapentin, endorses tingling in fingers to level of DIP. Denies interference with buttoning/ writing or other ADLs. She states mucositis symptoms have resolved. Patient was able to receive her Neupogen following the most recent cycle of chemotherapy.     Oncology History    No history exists.       Active Problems:    *  No active hospital problems. *      Past Medical History:   Diagnosis Date    Gestational trophoblastic disease          Past Surgical History:   Procedure Laterality Date    CESAREAN SECTION      IR INSERT PORT AGE GREATER THAN 5 YRS  01/12/2022    IR INSERT PORT AGE GREATER THAN 5 YRS 01/12/2022 [name], MD IMG [facility]       OB History   No obstetric history on file.       Social History     Socioeconomic History    Marital status: Single   Tobacco Use    Smoking status: Never    Smokeless tobacco: Never No family history on file.      Review of systems: Review of Systems - Negative except peripheral neuropathy.    Labs: Prechemo labs were drawn on 11/24  and are pending  All lab results last 24 hours:  No results found for this or any previous visit (from the past 24 hour(s)).    PE:   There were no vitals taken for this visit.    -General:   Well-appearing in NAD.  - HEENT  Few healing ulcerations under her tongue.   -Lymph:   No marked lymphadenopathy.  -CV:    RRR. No m/r/g.  -Pulm:   CTAB. Good air movement. Normal WOB.  -Abd:    Non-tender, non-distended abdomen. Normal bowel sounds.   -Extremities:   No clubbing, erythema, or edema.   -Psych:   Appropriate.The patient is awake and alert.  -GU:    Deferred.

## 2022-03-04 DIAGNOSIS — C7800 Secondary malignant neoplasm of unspecified lung: Secondary | ICD-10-CM | POA: Diagnosis not present

## 2022-03-04 DIAGNOSIS — O019 Hydatidiform mole, unspecified: Secondary | ICD-10-CM | POA: Diagnosis not present

## 2022-03-04 DIAGNOSIS — F419 Anxiety disorder, unspecified: Secondary | ICD-10-CM | POA: Diagnosis not present

## 2022-03-04 DIAGNOSIS — G9009 Other idiopathic peripheral autonomic neuropathy: Secondary | ICD-10-CM | POA: Diagnosis not present

## 2022-03-04 DIAGNOSIS — K1230 Oral mucositis (ulcerative), unspecified: Secondary | ICD-10-CM | POA: Diagnosis not present

## 2022-03-04 LAB — URINALYSIS WITH MICROSCOPY
BACTERIA: NONE SEEN /HPF
BILIRUBIN UA: NEGATIVE
BLOOD UA: NEGATIVE
GLUCOSE UA: NEGATIVE
KETONES UA: NEGATIVE
LEUKOCYTE ESTERASE UA: NEGATIVE
NITRITE UA: NEGATIVE
PH UA: 7.5 (ref 5.0–9.0)
PROTEIN UA: NEGATIVE
RBC UA: <1 /HPF (ref <=4)
SPECIFIC GRAVITY UA: 1.007 (ref 1.003–1.030)
SQUAMOUS EPITHELIAL: 1 /HPF (ref 0–5)
UROBILINOGEN UA: <2
WBC UA: <1 /HPF (ref 0–5)

## 2022-03-04 LAB — BASIC METABOLIC PANEL
ANION GAP: 6 mmol/L (ref 5–14)
BLOOD UREA NITROGEN: 8 mg/dL — ABNORMAL LOW (ref 9–23)
BUN / CREAT RATIO: 10
CALCIUM: 9.1 mg/dL (ref 8.7–10.4)
CHLORIDE: 108 mmol/L — ABNORMAL HIGH (ref 98–107)
CO2: 24 mmol/L (ref 20.0–31.0)
CREATININE: 0.83 mg/dL
EGFR CKD-EPI (2021) FEMALE: >90 mL/min/{1.73_m2} (ref >=60)
GLUCOSE RANDOM: 245 mg/dL — ABNORMAL HIGH (ref 70–179)
POTASSIUM: 4.3 mmol/L (ref 3.4–4.8)
SODIUM: 138 mmol/L (ref 135–145)

## 2022-03-04 LAB — PHOSPHORUS: PHOSPHORUS: 1 mg/dL — ABNORMAL LOW (ref 2.4–5.1)

## 2022-03-04 LAB — CBC
HEMATOCRIT: 28.8 % — ABNORMAL LOW (ref 34.0–44.0)
HEMOGLOBIN: 9.7 g/dL — ABNORMAL LOW (ref 11.3–14.9)
MEAN CORPUSCULAR HEMOGLOBIN CONC: 33.5 g/dL (ref 32.0–36.0)
MEAN CORPUSCULAR HEMOGLOBIN: 26.8 pg (ref 25.9–32.4)
MEAN CORPUSCULAR VOLUME: 80 fL (ref 77.6–95.7)
MEAN PLATELET VOLUME: 8.4 fL (ref 6.8–10.7)
PLATELET COUNT: 284 10*9/L (ref 150–450)
RED BLOOD CELL COUNT: 3.6 10*12/L — ABNORMAL LOW (ref 3.95–5.13)
RED CELL DISTRIBUTION WIDTH: 15.1 % (ref 12.2–15.2)
WBC ADJUSTED: 2.7 10*9/L — ABNORMAL LOW (ref 3.6–11.2)

## 2022-03-04 LAB — MAGNESIUM: MAGNESIUM: 1.7 mg/dL (ref 1.6–2.6)

## 2022-03-04 MED ORDER — GABAPENTIN 300 MG CAPSULE
ORAL_CAPSULE | Freq: Two times a day (BID) | ORAL | 0 refills | 30 days | Status: CP
Start: 2022-03-04 — End: 2022-04-03
  Filled 2022-03-23: qty 60, 30d supply, fill #0

## 2022-03-04 MED ORDER — FILGRASTIM 300 MCG/ML INJECTION VIAL
3 refills | 0 days | Status: CP
Start: 2022-03-04 — End: ?
  Filled 2022-03-04: qty 3, 3d supply, fill #0

## 2022-03-04 MED ORDER — ONDANSETRON HCL 4 MG TABLET
ORAL_TABLET | Freq: Three times a day (TID) | ORAL | 0 refills | 5 days | Status: CP | PRN
Start: 2022-03-04 — End: 2022-03-11

## 2022-03-04 MED ORDER — PROMETHAZINE 25 MG TABLET
ORAL_TABLET | Freq: Four times a day (QID) | ORAL | 0 refills | 8 days | Status: CN | PRN
Start: 2022-03-04 — End: 2022-03-11

## 2022-03-04 MED ORDER — LEUCOVORIN CALCIUM 15 MG TABLET
ORAL_TABLET | Freq: Two times a day (BID) | ORAL | 0 refills | 2 days | Status: CP
Start: 2022-03-04 — End: 2022-03-06
  Filled 2022-03-04: qty 3, 2d supply, fill #0

## 2022-03-04 MED ORDER — PROCHLORPERAZINE MALEATE 10 MG TABLET
ORAL_TABLET | Freq: Four times a day (QID) | ORAL | 0 refills | 8 days | Status: CP | PRN
Start: 2022-03-04 — End: 2022-03-12

## 2022-03-04 MED ADMIN — dexAMETHasone (DECADRON) tablet 12 mg: 12 mg | ORAL | @ 02:00:00 | Stop: 2022-03-03

## 2022-03-04 MED ADMIN — ondansetron (ZOFRAN) tablet 24 mg: 24 mg | ORAL | @ 02:00:00 | Stop: 2022-03-05

## 2022-03-04 MED ADMIN — gabapentin (NEURONTIN) capsule 300 mg: 300 mg | ORAL | @ 14:00:00

## 2022-03-04 MED ADMIN — sodium bicarbonate 100 mEq in dextrose 5 % 1,100 mL continuous infusion: 150 mL/h | INTRAVENOUS | @ 15:00:00

## 2022-03-04 MED ADMIN — gabapentin (NEURONTIN) capsule 300 mg: 300 mg | ORAL | @ 02:00:00

## 2022-03-04 MED ADMIN — sodium bicarbonate 100 mEq in dextrose 5 % 1,100 mL continuous infusion: 150 mL/h | INTRAVENOUS | @ 01:00:00 | Stop: 2022-03-04

## 2022-03-04 MED ADMIN — enoxaparin (LOVENOX) syringe 40 mg: 40 mg | SUBCUTANEOUS | @ 02:00:00

## 2022-03-04 MED ADMIN — famotidine (PEPCID) tablet 40 mg: 40 mg | ORAL | @ 02:00:00 | Stop: 2022-03-05

## 2022-03-04 MED ADMIN — DACTINomycin (COSMEGEN) syringe: .5 mg | INTRAVENOUS | @ 04:00:00 | Stop: 2022-03-05

## 2022-03-04 MED ADMIN — norethindrone-e.estradioL-iron 1 tablet (Lo Loestrin Fe) *PATIENT-SUPPLIED*: 1 | ORAL | @ 14:00:00

## 2022-03-04 MED ADMIN — diphenhydrAMINE (BENADRYL) capsule/tablet 25 mg: 25 mg | ORAL | @ 02:00:00 | Stop: 2022-03-05

## 2022-03-04 MED ADMIN — methotrexate (Preservative Free) 546 mg in sodium chloride (NS) 0.9 % 1,000 mL IVPB: 300 mg/m2 | INTRAVENOUS | @ 04:00:00

## 2022-03-04 MED ADMIN — etoposide (VEPESID) 182 mg in sodium chloride NON-PVC (NS) 0.9 % 500 mL IVPB: 100 mg/m2 | INTRAVENOUS | @ 03:00:00 | Stop: 2022-03-05

## 2022-03-04 MED ADMIN — sodium phosphate 45 mmol in dextrose 5 % 500 mL IVPB: 45 mmol | INTRAVENOUS | @ 17:00:00 | Stop: 2022-03-04

## 2022-03-04 MED ADMIN — sodium bicarbonate 100 mEq in dextrose 5 % 1,100 mL continuous infusion: 150 mL/h | INTRAVENOUS | @ 22:00:00

## 2022-03-04 MED ADMIN — fosaprepitant (EMEND) 150 mg in sodium chloride (NS) 0.9 % 100 mL IVPB: 150 mg | INTRAVENOUS | @ 02:00:00 | Stop: 2022-03-03

## 2022-03-04 NOTE — Unmapped (Signed)
Physician Discharge Summary    Admit date: 03/03/2022    Discharge date and time: 03/05/2022    Discharge to: Home    Discharge Service: Gynecology (GYN)    Discharge Attending Physician: [name], MD    Discharge Diagnoses: Gestational Trophoblastic Neoplasm    Procedures: None    Pertinent Test Results: None    Hospital Course:  Robin Gillespie is a 31 y.o. female admitted on 03/03/2022  with at least stage III:5 gestational trophoblastic neoplasm admitted for cycle 4 of EMA-CO. Her hospital course is outlined by pertinent problem below.      Onc: Gestational Trophoblastic Neoplasm   The patient had Cycle4 of inpatient EMA (D1-2) completed. This is her 2nd cycle past negative HCG. Planned home administration of Neupogen 300 mcg every day x3 starting 24h after completion of inpatient chemotherapy (D3-5). Patient was discharged with remaining Leucovorin (q12h x 4 starting 24h after methotrexate dose), as well as Zofran 8mg  oral TID as needed and Compazine 10 mg oral q6h as needed.      Anxiety   The patient's anxiety could precipitate nausea/vomiting. She was administered Ativan and Atarax inpatient as needed.      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 She continued to have no impact on daily activities.    Oral Mucositis, Grade 1  Patient was managed with magic mouthwash as needed. She was provided information for using baking soda at home to manage her mucositis.      Patient was deemed stable on HD#3/(03/05/22). On day of discharge patient???s pain was well controlled on oral medications, she was tolerating a regular diet without nausea/vomiting, voiding spontaneously, having bowel movements without issue, and ambulating. She will follow up with Dr. Alvester Morin on 03/10/2022 for further treatment planning.      Condition at Discharge: stable  Discharge Medications:      Your Medication List        ASK your doctor about these medications      BD LUER-LOK SYRINGE 3 mL 25 x 5/8 Syrg  Generic drug: syringe with needle  Use to inject neupogen at home under the skin     empty container Misc  Commonly known as: sharps container  Use as directed     filgrastim 300 mcg/mL injection  Commonly known as: NEUPOGEN  Inject 1 mL (300 mcg total) under the skin 24 hours after completion chemotherapy. Repeat every 24 hours for 3 days.     gabapentin 300 MG capsule  Commonly known as: NEURONTIN  Take 1 capsule (300 mg total) by mouth nightly.     ibuprofen 600 MG tablet  Commonly known as: MOTRIN  Take 1 tablet (600 mg total) by mouth every eight (8) hours as needed for pain.     leuCOVorin 15 MG tablet  Commonly known as: WELLCOVORIN  Take 1 tablet (15 mg total) by mouth every twelve (12) hours for 3 doses.  Ask about: Should I take this medication?     LOESTRIN FE 1/20 (28) ORAL  Take 1 tablet by mouth in the morning.     loratadine 10 mg tablet  Commonly known as: CLARITIN  Take 1 tablet (10 mg total) by mouth daily. Start taking 1 day prior to filgrastim injections and continue until 1 day after last injection     magic mouthwash suspension  Swish and spit 10 mls 4 times  a day as needed.     ondansetron 4 MG tablet  Commonly known as: ZOFRAN  Take 2 tablets (8 mg total) by mouth every eight (8) hours as needed (breakthrough nausea or vomiting unrelieved by prochlorperazine or promethazine) for up to 7 days.  Ask about: Should I take this medication?     prochlorperazine 10 MG tablet  Commonly known as: COMPAZINE  Take 1 tablet (10 mg total) by mouth every six (6) hours as needed (breakthrough nausea or vomiting) for up to 8 days.  Ask about: Should I take this medication?     promethazine 25 MG tablet  Commonly known as: PHENERGAN  Take 1 tablet (25 mg total) by mouth every six (6) hours as needed (breakthrough nausea or vomiting unrelieved by prochlorperazine) for up to 7 days.  Ask about: Should I take this medication?     SENNA 8.6 mg tablet  Generic drug: senna  Take 2 tablets by mouth nightly as needed for constipation.              Pending Test Results: None       Discharge Instructions:   DISCHARGE INSTRUCTIONS Please take new medication as prescribed and resume all home medications. Medications - You will take Leucovorin every 12 hours for a total of 4 doses. You received one dose prior to discharge. - You have compazine and Zofran for nausea Please take precautions when in public areas while your counts are low. Please do not return to work until you are cleared. Please keep all follow up appointments. When to Contact us - Contact MD if you develop temperature >100.4, unresolvable pain, frequent vomiting, or heavy bleeding. - If you have any questions or concerns, call Clinic at [phone] during normal business hours (Mon-Fri 8-5) and/or call hospital operator [phone] and ask for the gynecology resident on-call. Please allow up to 30 minutes for a return phone call by the gynecology resident. If you suspect an emergency, please go to your nearest Emergency Department. Baking soda swish and spit: - You should rinse your mouth (swish and spit) after meals and at bedtime with the following mixture of: - ?? teaspoon of salt and ?? teaspoon of baking soda in 8 ounces of water - You can prepare a daily supply of salt/baking soda rinse with 1 quart (4 cups) water, 1 teaspoon salt, 1 teaspoon baking soda (prepare new batch each day and dispose of old one). - Additionally, avoid mouthwashes and toothpaste that contain alcohol and use a soft bristle toothbrush to clean your teeth. Follow up You will need a follow up with Dr. Alvester Morin in roughly 1 week. The [facility] clinic will contact you within the next few days. However, if you do not hear from them within 1 week, please call [phone] and arrange your appointment.     Appointments which have been scheduled for you      Mar 10, 2022  9:30 AM  (Arrive by 9:00 AM)  LAB ONLY [place] with ADULT ONC LAB  [facility] ADULT ONCOLOGY LAB DRAW STATION [place] [facility] REGION) [address]  [place] [address]  [phone]        Mar 10, 2022 10:30 AM  (Arrive by 10:15 AM)  RETURN FOLLOW UP [place] with [name], MD  [facility] OBGYN GYN ONCOLOGY 1ST FLR [facility] REG[facility]) [address]  [place] [address]  [phone]        Mar 10, 2022 11:30 AM  (  Arrive by 11:00 AM)  LEVEL [address] with [employer] CHAIR [address]  [place] [place] ONCOLOGY INFUSION  [facility] REGION) [address]  [place] [address]  [phone]                Scribe's Attestation: Juliene Pina, MD obtained and performed the history, physical exam and medical decision making elements that were  entered into the chart. Documentation assistance was provided by me personally, a scribe. Signed by Marylin Crosby Scribe, on March 04, 2022 at 7:09 AM.     ----------------------------------------------------------------------------------------------------------------------  March 05, 2022 6:37 AM. Documentation assistance provided by the Scribe. I was present during the time the encounter was recorded. The information recorded by the Scribe was done at my direction and has been reviewed and validated by me.  ----------------------------------------------------------------------------------------------------------------------    Juliene Pina, MD  OBGYN Resident, PGY-2  [facility]

## 2022-03-04 NOTE — Unmapped (Signed)
VENOUS ACCESS ULTRASOUND PROCEDURE NOTE    Indications:   Poor venous access.    The Venous Access Team has assessed this patient for the placement of a PIV. Ultrasound guidance was necessary to obtain access.     Procedure Details:  Identity of the patient was confirmed via name, medical record number and date of birth. The availability of the correct equipment was verified.    The vein was identified for ultrasound catheter insertion.  Field was prepared with necessary supplies and equipment.  Probe cover and sterile gel utilized.  Insertion site was prepped with chlorhexidine solution and allowed to dry.  The catheter extension was primed with normal saline.A(n) 22 gauge 1.75 catheter was placed in the L Forearm with 2attempt(s). See LDA for additional details.    Catheter aspirated, 5 mL blood return present. The catheter was then flushed with 10 mL of normal saline. Insertion site cleansed, and dressing applied per manufacturer guidelines. The catheter was inserted with difficulty due to poor vasculatureby Franchot Gallo, RN.     care RN was notified.     Thank you,     Franchot Gallo, RN Venous Access Team   [phone]     Workup / Procedure Time:  30 minutes    See vein image below:

## 2022-03-04 NOTE — Unmapped (Addendum)
Robin Gillespie is a 31 y.o. female admitted on 03/03/2022  with at least stage III:5 gestational trophoblastic neoplasm admitted for cycle 4 of EMA-CO. Her hospital course is outlined by pertinent problem below.      Onc: Gestational Trophoblastic Neoplasm   The patient had a history of molar pregnancy in 2019 followed to hCG undetectable x after 6 months. Her most recent pregnancy resulted in a cesarean section in 02/2021 with normal placental pathology but continued to have an elevated HCG. She underwent a D&C which did not demonstrate retained products of conception. Her last hCG 11/3 was 4.5 (max 2739 on 7/14), her hCG on admission was <2.6. Her last imaging was a CT CAP that was notable for heterogeneous uterus with myometrium up to 5 cm, solid pulmonary nodules in right lower lung and lingula consistent with metastatic disease. Her 10/1 MRI brain was notable for incidental scattered foci of T2 nonspecific, referred to neurology on 10/4. The patient strongly desires fertility preservation, risk factors include GTN remote from term pregnancy. The patient had 4C of inpatient EMA (D1-2) and outpatient CO (D8). Planned home administration of Neupogen 300 mcg every day x3 starting 24h after completion of inpatient chemotherapy (D3-5). Patient was discharged with remaining Leucovorin (q12h x 4 starting 24h after methotrexate dose), as well as Zofran 8mg  oral TID as needed and Compazine 10 mg oral q6h as needed.      Anxiety   The patient's anxiety could precipitate nausea/vomiting. She was administered Ativan and Atarax inpatient as needed.      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 She continued to impact on daily activities.    Oral Mucositis, Grade 1  Patient was managed with magic mouthwash as needed. She was provided information for using baking soda at home to manage her mucositis.      Patient was deemed stable on HD#***/(11/***/23). On day of discharge patient???s pain was well controlled on oral medications, she was tolerating a regular diet without nausea/vomiting, voiding spontaneously, having bowel movements without issue, and ambulating. She will follow up with Dr. Alvester Morin on 03/10/2022 for further treatment planning.

## 2022-03-04 NOTE — Unmapped (Signed)
Plan of care reviewed with patient, verbalized good understanding. Vital signs stable. Denies any pain. Urine PH 8.0, Chemo started and in progress, tolerated thus far. Voided adequate amount of clear urine. No BM during this shift. No report of nausea. Comfortable and stable. Pharmacy notified to re-time next chemo doses and leucovorin.     Problem: Adult Inpatient Plan of Care  Goal: Plan of Care Review  Outcome: Progressing  Goal: Patient-Specific Goal (Individualized)  Outcome: Progressing  Goal: Absence of Hospital-Acquired Illness or Injury  Outcome: Progressing  Goal: Optimal Comfort and Wellbeing  Outcome: Progressing  Goal: Readiness for Transition of Care  Outcome: Progressing  Goal: Rounds/Family Conference  Outcome: Progressing     Problem: Chemotherapy Effects  Goal: Anemia Symptom Improvement  Outcome: Progressing  Goal: Safety Maintained  Outcome: Progressing  Goal: Absence of Hematuria  Outcome: Progressing  Goal: Nausea and Vomiting Relief  Outcome: Progressing  Goal: Neurotoxicity Symptom Control  Outcome: Progressing  Goal: Absence of Infection  Outcome: Progressing  Goal: Absence of Bleeding  Outcome: Progressing

## 2022-03-04 NOTE — Unmapped (Signed)
Plan of care reviewed and agreed upon with patient. Patient continues to be observed on [facility]. Patient completed methotrexate and currently has sodium bicarbonate and sodium phos running. IV team came and placed a PIV so that meds can run. Patient denies pain, and is voiding adequately. VSS. No reports of nausea or vomiting. Patient to have medication delivered to bedside so that they can decide if they want to leave hospital after last admin of chemo. Plan of care ongoing.   Problem: Adult Inpatient Plan of Care  Goal: Plan of Care Review  Outcome: Progressing  Goal: Patient-Specific Goal (Individualized)  Outcome: Progressing  Goal: Absence of Hospital-Acquired Illness or Injury  Outcome: Progressing  Intervention: Identify and Manage Fall Risk  Recent Flowsheet Documentation  Taken 03/04/2022 0715 by Lucas Mallow D, RN  Safety Interventions:   low bed   lighting adjusted for tasks/safety   fall reduction program maintained   nonskid shoes/slippers when out of bed  Intervention: Prevent Skin Injury  Recent Flowsheet Documentation  Taken 03/04/2022 0715 by Lucas Mallow D, RN  Positioning for Skin: Right  Intervention: Prevent and Manage VTE (Venous Thromboembolism) Risk  Recent Flowsheet Documentation  Taken 03/04/2022 0914 by Lucas Mallow D, RN  Anti-Embolism Device Type: SCD, Knee  Anti-Embolism Intervention: Refused  Anti-Embolism Device Location: BLE  Intervention: Prevent Infection  Recent Flowsheet Documentation  Taken 03/04/2022 0715 by Concha Norway, RN  Infection Prevention: cohorting utilized  Goal: Optimal Comfort and Wellbeing  Outcome: Progressing  Goal: Readiness for Transition of Care  Outcome: Progressing  Goal: Rounds/Family Conference  Outcome: Progressing

## 2022-03-04 NOTE — Unmapped (Signed)
GYN ONC PROGRESS NOTE    ASSESSMENT AND PLAN     Hospital Day: 2    Robin Gillespie is a 31 y.o. 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    Onc: Gestational Trophoblastic Neoplasm  - See HPI for full oncology history.   - Last hCG 11/17: 2.6  > Admit HCG 2.6  - Plan for cycle 4 of EMA (D1-2) then outpatient CO (D8)  - Plan for home administration of Neupogen 300 mcg every day x 3 starting 24 hrs after completion of inpatient chemo (D3-5) and remaining leucovorin (q12h x 4 starting 24hrs after methotrexate dose)     Anxiety  - Anxiety can precipitate nausea/vomiting  - Atarax ordered PRN     Peripheral neuropathy  - Continue gabapentin 300mg  nightly     Oral Mucositis  - Magic mouthwash PRN     PPx: SCDs, ambulation, Lovenox     Dispo: Floor    Plan discussed with Dr. Chester Holstein and Attending Dr. Alvester Morin.    SUBJECTIVE     NAEON. Pt reported worsened neuropathic pain overnight. Felt like she had spots on her palm. Denies any N/V, headaches, chest pain, abdominal pain.     OBJECTIVE     Temp:  [36.7 ??C (98.1 ??F)-37.3 ??C (99.1 ??F)] 36.7 ??C (98.1 ??F)  Heart Rate:  [90-94] 90  Resp:  [16-18] 18  BP: (119-135)/(60-69) 119/60  SpO2:  [100 %] 100 %    Gen: NAD, awake, alert, oriented  CV: regular rate and rhythm   Pulm: clear to auscultation bilaterally, normal work of breathing  Abd: Soft, appropriately tender to palpation, non-distended, bowel sounds present  GU: deferred    Medications (scheduled)    DACTINomycin  0.5 mg Intravenous Q24H    dexAMETHasone  8 mg Oral Q24H    diphenhydrAMINE  25 mg Oral Q24H    enoxaparin (LOVENOX) injection  40 mg Subcutaneous Q24H    etoposide  100 mg/m2 (Order-Specific) Intravenous Q24H    famotidine  40 mg Oral Q24H    gabapentin  300 mg Oral Nightly    leuCOVorin  15 mg Oral Q12H    methotrexate (Preservative Free) 546 mg in sodium chloride (NS) 0.9 % 1,000 mL IVPB  300 mg/m2 (Order-Specific) Intravenous Once    norethindrone-e.estradioL-iron  1 tablet Oral Daily    ondansetron  24 mg Oral Q24H       Medications (prn)  dexAMETHasone, diphenhydrAMINE, senna **AND** docusate sodium, EPINEPHrine IM, famotidine (PEPCID) IV, hydrOXYzine, IP okay to treat, LORazepam, LORazepam, methylPREDNISolone sodium succinate, lidocaine-diphenhydrAMINE-aluminum-magnesium, ondansetron, ondansetron, prochlorperazine (COMPAZINE) 10 mg in sodium chloride (NS) 0.9 % 25 mL IVPB, prochlorperazine, promethazine, promethazine, sodium chloride, sodium chloride 0.9%    Labs:   Lab Results   Component Value Date    WBC 2.7 (L) 03/04/2022    HGB 9.7 (L) 03/04/2022    HCT 28.8 (L) 03/04/2022    PLT 284 03/04/2022       Lab Results   Component Value Date    NA 138 03/04/2022    K 4.3 03/04/2022    CL 108 (H) 03/04/2022    CO2 24.0 03/04/2022    BUN 8 (L) 03/04/2022    CREATININE 0.83 03/04/2022    GLU 245 (H) 03/04/2022    CALCIUM 9.1 03/04/2022    MG 1.7 03/04/2022    PHOS 1.0 (L) 03/04/2022       Lab  Results   Component Value Date    BILITOT 0.4 03/03/2022    PROT 7.3 03/03/2022    ALBUMIN 3.9 03/03/2022    ALT 21 03/03/2022    AST 16 03/03/2022    ALKPHOS 65 03/03/2022       No results found for: LABPROT, INR, APTT    Scribe's Attestation: Norlene Campbell, MD, obtained and performed the history, physical exam and medical decision making elements that were  entered into the chart. Documentation assistance was provided by me personally, a scribe. Signed by Marylin Crosby Scribe, on March 04, 2022 at 5:35 AM.       ----------------------------------------------------------------------------------------------------------------------  March 04, 2022 6:39 AM. Documentation assistance provided by the Scribe. I was present during the time the encounter was recorded. The information recorded by the Scribe was done at my direction and has been reviewed and validated by me.  ----------------------------------------------------------------------------------------------------------------------

## 2022-03-04 NOTE — Unmapped (Signed)
Patient direct admit from home to 4840 around 1118. Patient admitted to receive Low Dose EMA-CO Cycle 4, day 1 to 2 for Gestational Trophoblastic Neoplasm. Chemo orders and protocol checked and verified with another Company secretary. All orders released as well. Port a cath accessed and tolerated well. Assessment completed. Vital signs monitored. Denies pain. Sodium Bicarbonate presently infusing and will collect urine after 4 hours of infusion per protocol. Plan of care discussed, verbalized good understanding.  Problem: Adult Inpatient Plan of Care  Goal: Plan of Care Review  Outcome: Progressing     Problem: Adult Inpatient Plan of Care  Goal: Patient-Specific Goal (Individualized)  Outcome: Progressing     Problem: Adult Inpatient Plan of Care  Goal: Absence of Hospital-Acquired Illness or Injury  Outcome: Progressing     Problem: Adult Inpatient Plan of Care  Goal: Absence of Hospital-Acquired Illness or Injury  Intervention: Identify and Manage Fall Risk  Safety Interventions:   fall reduction program maintained   low bed     Problem: Adult Inpatient Plan of Care  Goal: Absence of Hospital-Acquired Illness or Injury  Intervention: Prevent Skin Injury  Positioning for Skin: Supine/Back     Problem: Adult Inpatient Plan of Care  Goal: Absence of Hospital-Acquired Illness or Injury  Intervention: Prevent and Manage VTE (Venous Thromboembolism) Risk  Anti-Embolism Device Type: SCD, Knee  Anti-Embolism Intervention: Refused  Anti-Embolism Device Location: BLE     Problem: Adult Inpatient Plan of Care  Goal: Optimal Comfort and Wellbeing  Outcome: Progressing     Problem: Adult Inpatient Plan of Care  Goal: Readiness for Transition of Care  Outcome: Progressing     Problem: Chemotherapy Effects  Goal: Anemia Symptom Improvement  Outcome: Progressing     Problem: Chemotherapy Effects  Goal: Anemia Symptom Improvement  Intervention: Monitor and Manage Anemia  Safety Interventions:   fall reduction program maintained   low bed     Problem: Chemotherapy Effects  Goal: Safety Maintained  Outcome: Progressing     Problem: Chemotherapy Effects  Goal: Absence of Hematuria  Outcome: Progressing     Problem: Chemotherapy Effects  Goal: Neurotoxicity Symptom Control  Outcome: Progressing     Problem: Chemotherapy Effects  Goal: Absence of Infection  Outcome: Progressing     Problem: Chemotherapy Effects  Goal: Absence of Bleeding  Outcome: Progressing

## 2022-03-05 DIAGNOSIS — O019 Hydatidiform mole, unspecified: Secondary | ICD-10-CM | POA: Diagnosis not present

## 2022-03-05 DIAGNOSIS — G9009 Other idiopathic peripheral autonomic neuropathy: Secondary | ICD-10-CM | POA: Diagnosis not present

## 2022-03-05 DIAGNOSIS — F419 Anxiety disorder, unspecified: Secondary | ICD-10-CM | POA: Diagnosis not present

## 2022-03-05 DIAGNOSIS — K1230 Oral mucositis (ulcerative), unspecified: Secondary | ICD-10-CM | POA: Diagnosis not present

## 2022-03-05 MED ADMIN — dexAMETHasone (DECADRON) tablet 8 mg: 8 mg | ORAL | @ 02:00:00 | Stop: 2022-03-04

## 2022-03-05 MED ADMIN — famotidine (PEPCID) tablet 40 mg: 40 mg | ORAL | @ 02:00:00 | Stop: 2022-03-04

## 2022-03-05 MED ADMIN — heparin, porcine (PF) 100 unit/mL injection 500 Units: 500 [IU] | INTRAVENOUS | @ 13:00:00 | Stop: 2022-03-05

## 2022-03-05 MED ADMIN — DACTINomycin (COSMEGEN) syringe: .5 mg | INTRAVENOUS | @ 04:00:00 | Stop: 2022-03-04

## 2022-03-05 MED ADMIN — diphenhydrAMINE (BENADRYL) capsule/tablet 25 mg: 25 mg | ORAL | @ 02:00:00 | Stop: 2022-03-04

## 2022-03-05 MED ADMIN — enoxaparin (LOVENOX) syringe 40 mg: 40 mg | SUBCUTANEOUS | @ 02:00:00

## 2022-03-05 MED ADMIN — gabapentin (NEURONTIN) capsule 300 mg: 300 mg | ORAL | @ 13:00:00 | Stop: 2022-03-05

## 2022-03-05 MED ADMIN — ondansetron (ZOFRAN) tablet 24 mg: 24 mg | ORAL | @ 02:00:00 | Stop: 2022-03-04

## 2022-03-05 MED ADMIN — leuCOVorin (WELLCOVORIN) tablet 15 mg: 15 mg | ORAL | @ 15:00:00 | Stop: 2022-03-05

## 2022-03-05 MED ADMIN — leuCOVorin (WELLCOVORIN) tablet 15 mg: 15 mg | ORAL | @ 04:00:00 | Stop: 2022-03-06

## 2022-03-05 MED ADMIN — gabapentin (NEURONTIN) capsule 300 mg: 300 mg | ORAL | @ 02:00:00

## 2022-03-05 MED ADMIN — etoposide (VEPESID) 182 mg in sodium chloride NON-PVC (NS) 0.9 % 500 mL IVPB: 100 mg/m2 | INTRAVENOUS | @ 03:00:00 | Stop: 2022-03-04

## 2022-03-05 NOTE — Unmapped (Signed)
VSS afebrile tolerating regular diet with no N/V.  Up ambulating with no c/o SOB or dizziness. No complaint of Pain.  PIV is CDI and patent, flushes well. UOP is adequate. Remains free from falls and injuries. No s/s of chemo rxn.  All home med reviewed and put on calendar for pt home schedule.   POC discontinued and patient discharged home with medications from outpatient pharmacy that were delivered to bedside.   Problem: Adult Inpatient Plan of Care  Goal: Plan of Care Review  Outcome: Discharged to Home  Goal: Patient-Specific Goal (Individualized)  Outcome: Discharged to Home  Goal: Absence of Hospital-Acquired Illness or Injury  Outcome: Discharged to Home  Intervention: Identify and Manage Fall Risk  Recent Flowsheet Documentation  Taken 03/05/2022 0715 by Rowe Pavy, RN  Safety Interventions:   fall reduction program maintained   family at bedside   lighting adjusted for tasks/safety   low bed   nonskid shoes/slippers when out of bed  Intervention: Prevent Skin Injury  Recent Flowsheet Documentation  Taken 03/05/2022 0800 by Rowe Pavy, RN  Positioning for Skin: Standing  Taken 03/05/2022 0715 by Rowe Pavy, RN  Positioning for Skin: (OOB) Other (Comment)  Intervention: Prevent and Manage VTE (Venous Thromboembolism) Risk  Recent Flowsheet Documentation  Taken 03/05/2022 0800 by Rowe Pavy, RN  Anti-Embolism Intervention: Refused  Intervention: Prevent Infection  Recent Flowsheet Documentation  Taken 03/05/2022 0715 by Rowe Pavy, RN  Infection Prevention: cohorting utilized  Goal: Optimal Comfort and Wellbeing  Outcome: Discharged to Home  Goal: Readiness for Transition of Care  Outcome: Discharged to Home  Goal: Rounds/Family Conference  Outcome: Discharged to Home     Problem: Chemotherapy Effects  Goal: Anemia Symptom Improvement  Outcome: Discharged to Home  Intervention: Monitor and Manage Anemia  Recent Flowsheet Documentation  Taken 03/05/2022 0715 by Rowe Pavy, RN  Safety Interventions:   fall reduction program maintained   family at bedside   lighting adjusted for tasks/safety   low bed   nonskid shoes/slippers when out of bed  Goal: Safety Maintained  Outcome: Discharged to Home  Intervention: Promote Safe Chemotherapy Delivery  Recent Flowsheet Documentation  Taken 03/05/2022 0715 by Rowe Pavy, RN  Infection Prevention: cohorting utilized  Goal: Absence of Hematuria  Outcome: Discharged to Home  Goal: Nausea and Vomiting Relief  Outcome: Discharged to Home  Goal: Neurotoxicity Symptom Control  Outcome: Discharged to Home  Goal: Absence of Infection  Outcome: Discharged to Home  Intervention: Prevent Infection and Maximize Resistance  Recent Flowsheet Documentation  Taken 03/05/2022 0715 by Rowe Pavy, RN  Infection Prevention: cohorting utilized  Goal: Absence of Bleeding  Outcome: Discharged to Home

## 2022-03-05 NOTE — Unmapped (Shared)
GYN ONC PROGRESS NOTE    ASSESSMENT AND PLAN     Hospital Day: 3    Robin Gillespie is a 31 y.o. 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    Onc: Gestational Trophoblastic Neoplasm  - See HPI for full oncology history.   - Last hCG 11/17: 2.6  > Admit HCG 2.6  - Plan for cycle 4 of EMA (D1-2) then outpatient CO (D8)  - Plan for home administration of Neupogen 300 mcg every day x 3 starting 24 hrs after completion of inpatient chemo (D3-5) and remaining leucovorin (q12h x 4 starting 24hrs after methotrexate dose)     Anxiety  - Anxiety can precipitate nausea/vomiting  - Atarax ordered PRN     Peripheral neuropathy  - Continue gabapentin 300mg  nightly     Oral Mucositis  - Magic mouthwash PRN     PPx: SCDs, ambulation, Lovenox     Dispo: Floor    Plan discussed with Dr. Chester Holstein and Attending Dr. Alvester Morin.    SUBJECTIVE     NAEON. Pt reported worsened neuropathic pain overnight. Felt like she had spots on her palm. Denies any N/V, headaches, chest pain, abdominal pain. ***    OBJECTIVE     Temp:  [36.6 ??C (97.9 ??F)-36.8 ??C (98.3 ??F)] 36.6 ??C (97.9 ??F)  Heart Rate:  [90-101] 90  Resp:  [18] 18  BP: (114-127)/(51-65) 114/51  SpO2:  [99 %-100 %] 99 %    Gen: NAD, awake, alert, oriented  CV: regular rate and rhythm   Pulm: clear to auscultation bilaterally, normal work of breathing  Abd: Soft, appropriately tender to palpation, non-distended, bowel sounds present  GU: deferred    Medications (scheduled)    enoxaparin (LOVENOX) injection  40 mg Subcutaneous Q24H    gabapentin  300 mg Oral BID    leuCOVorin  15 mg Oral Q12H    norethindrone-e.estradioL-iron  1 tablet Oral Daily       Medications (prn)  dexAMETHasone, diphenhydrAMINE, senna **AND** docusate sodium, EPINEPHrine IM, famotidine (PEPCID) IV, hydrOXYzine, IP okay to treat, LORazepam, LORazepam, methylPREDNISolone sodium succinate, lidocaine-diphenhydrAMINE-aluminum-magnesium, ondansetron, ondansetron, prochlorperazine (COMPAZINE) 10 mg in sodium chloride (NS) 0.9 % 25 mL IVPB, prochlorperazine, promethazine, promethazine, sodium chloride, sodium chloride 0.9%    Labs:   Lab Results   Component Value Date    WBC 2.7 (L) 03/04/2022    HGB 9.7 (L) 03/04/2022    HCT 28.8 (L) 03/04/2022    PLT 284 03/04/2022       Lab Results   Component Value Date    NA 138 03/04/2022    K 4.3 03/04/2022    CL 108 (H) 03/04/2022    CO2 24.0 03/04/2022    BUN 8 (L) 03/04/2022    CREATININE 0.83 03/04/2022    GLU 245 (H) 03/04/2022    CALCIUM 9.1 03/04/2022    MG 1.7 03/04/2022    PHOS 1.0 (L) 03/04/2022       Lab Results   Component Value Date    BILITOT 0.4 03/03/2022    PROT 7.3 03/03/2022    ALBUMIN 3.9 03/03/2022    ALT 21 03/03/2022    AST 16 03/03/2022    ALKPHOS 65 03/03/2022       No results found for: LABPROT, INR, APTT    Scribe's Attestation: ***, MD obtained and performed the history, physical exam and medical decision making elements that were  entered into the chart. Documentation assistance was provided by me personally, a scribe. Signed by Marcy Siren Scribe, on March 05, 2022 at 6:00 AM.         {*** NOTE TO PROVIDER: PLEASE ADD ATTESTATION NOTING YOU AGREE WITH SCRIBE DOCUMENTATION. For template- Go to smartphrases --> Orantes, C --> [email or web address]}

## 2022-03-05 NOTE — Unmapped (Signed)
Plan of care reviewed with patient, verbalized good understanding. Day 2 chemo completed as ordered. Tolerated well. Leucovorin started at ordered time. Patient educated. Tolerated regular diet. No nausea or vomiting. Voiding adequate amount of clear urine. No BM during this shift.     Problem: Adult Inpatient Plan of Care  Goal: Plan of Care Review  Outcome: Progressing  Goal: Patient-Specific Goal (Individualized)  Outcome: Progressing  Goal: Absence of Hospital-Acquired Illness or Injury  Outcome: Progressing  Intervention: Identify and Manage Fall Risk  Recent Flowsheet Documentation  Taken 03/04/2022 1902 by Domingo Pulse, RN  Safety Interventions:   low bed   fall reduction program maintained   chemotherapeutic agent precautions   nonskid shoes/slippers when out of bed  Intervention: Prevent Skin Injury  Recent Flowsheet Documentation  Taken 03/04/2022 1902 by Domingo Pulse, RN  Positioning for Skin: Supine/Back  Intervention: Prevent and Manage VTE (Venous Thromboembolism) Risk  Recent Flowsheet Documentation  Taken 03/05/2022 0100 by Domingo Pulse, RN  Anti-Embolism Device Type: SCD, Knee  Anti-Embolism Intervention: Refused  Anti-Embolism Device Location: BLE  Taken 03/04/2022 2300 by Domingo Pulse, RN  Anti-Embolism Device Type: SCD, Knee  Anti-Embolism Intervention: Refused  Anti-Embolism Device Location: BLE  Taken 03/04/2022 2100 by Domingo Pulse, RN  Anti-Embolism Device Type: SCD, Knee  Anti-Embolism Intervention: Refused  Anti-Embolism Device Location: BLE  Taken 03/04/2022 1902 by Domingo Pulse, RN  Anti-Embolism Device Type: SCD, Knee  Anti-Embolism Intervention: Refused  Anti-Embolism Device Location: BLE  Goal: Optimal Comfort and Wellbeing  Outcome: Progressing  Goal: Readiness for Transition of Care  Outcome: Progressing  Goal: Rounds/Family Conference  Outcome: Progressing     Problem: Chemotherapy Effects  Goal: Anemia Symptom Improvement  Outcome: Progressing  Intervention: Monitor and Manage Anemia  Recent Flowsheet Documentation  Taken 03/04/2022 1902 by Domingo Pulse, RN  Safety Interventions:   low bed   fall reduction program maintained   chemotherapeutic agent precautions   nonskid shoes/slippers when out of bed  Goal: Safety Maintained  Outcome: Progressing  Goal: Absence of Hematuria  Outcome: Progressing  Goal: Nausea and Vomiting Relief  Outcome: Progressing  Goal: Neurotoxicity Symptom Control  Outcome: Progressing  Goal: Absence of Infection  Outcome: Progressing  Goal: Absence of Bleeding  Outcome: Progressing

## 2022-03-08 ENCOUNTER — Telehealth (HOSPITAL_COMMUNITY): Payer: Self-pay | Admitting: Neurology

## 2022-03-08 NOTE — Telephone Encounter (Signed)
Just an FYI. We have made several attempts to contact this patient including sending a letter to schedule or reschedule their echocardiogram. We will be removing the patient from the echo WQ.   SENT LETTER THRU MY CHART  02/23/22 [facility]  to schedule X @ 12:06/LBW  02/20/22 [facility] to schedule x 2 @ 2:39/LBW  02/15/22 [facility] to schedule @ 10:11/LBW      Thank you

## 2022-03-10 ENCOUNTER — Ambulatory Visit: Admit: 2022-03-10 | Discharge: 2022-03-11 | Payer: BLUE CROSS/BLUE SHIELD

## 2022-03-10 ENCOUNTER — Other Ambulatory Visit: Admit: 2022-03-10 | Discharge: 2022-03-11 | Payer: BLUE CROSS/BLUE SHIELD

## 2022-03-10 DIAGNOSIS — G629 Polyneuropathy, unspecified: Secondary | ICD-10-CM | POA: Diagnosis not present

## 2022-03-10 DIAGNOSIS — O019 Hydatidiform mole, unspecified: Secondary | ICD-10-CM | POA: Diagnosis not present

## 2022-03-10 DIAGNOSIS — K1230 Oral mucositis (ulcerative), unspecified: Secondary | ICD-10-CM | POA: Diagnosis not present

## 2022-03-10 DIAGNOSIS — Z5111 Encounter for antineoplastic chemotherapy: Secondary | ICD-10-CM | POA: Diagnosis not present

## 2022-03-10 DIAGNOSIS — G620 Drug-induced polyneuropathy: Secondary | ICD-10-CM | POA: Diagnosis not present

## 2022-03-10 DIAGNOSIS — K1231 Oral mucositis (ulcerative) due to antineoplastic therapy: Secondary | ICD-10-CM | POA: Diagnosis not present

## 2022-03-10 DIAGNOSIS — T451X5A Adverse effect of antineoplastic and immunosuppressive drugs, initial encounter: Secondary | ICD-10-CM | POA: Diagnosis not present

## 2022-03-10 LAB — COMPREHENSIVE METABOLIC PANEL
ALBUMIN: 3.9 g/dL (ref 3.4–5.0)
ALKALINE PHOSPHATASE: 77 U/L (ref 46–116)
ALT (SGPT): 25 U/L (ref 10–49)
ANION GAP: 5 mmol/L (ref 5–14)
AST (SGOT): 14 U/L (ref <=34)
BILIRUBIN TOTAL: 0.4 mg/dL (ref 0.3–1.2)
BLOOD UREA NITROGEN: 11 mg/dL (ref 9–23)
BUN / CREAT RATIO: 14
CALCIUM: 9.1 mg/dL (ref 8.7–10.4)
CHLORIDE: 106 mmol/L (ref 98–107)
CO2: 27 mmol/L (ref 20.0–31.0)
CREATININE: 0.79 mg/dL
EGFR CKD-EPI (2021) FEMALE: >90 mL/min/{1.73_m2} (ref >=60)
GLUCOSE RANDOM: 109 mg/dL (ref 70–179)
POTASSIUM: 4 mmol/L (ref 3.4–4.8)
PROTEIN TOTAL: 7.4 g/dL (ref 5.7–8.2)
SODIUM: 138 mmol/L (ref 135–145)

## 2022-03-10 LAB — PERIPHERAL BLOOD SMEAR, PATH REVIEW

## 2022-03-10 LAB — CBC W/ AUTO DIFF
BASOPHILS ABSOLUTE COUNT: 0.1 10*9/L (ref 0.0–0.1)
BASOPHILS RELATIVE PERCENT: 0.7 %
EOSINOPHILS ABSOLUTE COUNT: 0.2 10*9/L (ref 0.0–0.5)
EOSINOPHILS RELATIVE PERCENT: 2 %
HEMATOCRIT: 29.8 % — ABNORMAL LOW (ref 34.0–44.0)
HEMOGLOBIN: 10.2 g/dL — ABNORMAL LOW (ref 11.3–14.9)
LYMPHOCYTES ABSOLUTE COUNT: 1 10*9/L — ABNORMAL LOW (ref 1.1–3.6)
LYMPHOCYTES RELATIVE PERCENT: 13.4 %
MEAN CORPUSCULAR HEMOGLOBIN CONC: 34.3 g/dL (ref 32.0–36.0)
MEAN CORPUSCULAR HEMOGLOBIN: 27.6 pg (ref 25.9–32.4)
MEAN CORPUSCULAR VOLUME: 80.5 fL (ref 77.6–95.7)
MEAN PLATELET VOLUME: 7.7 fL (ref 6.8–10.7)
MONOCYTES ABSOLUTE COUNT: 0.4 10*9/L (ref 0.3–0.8)
MONOCYTES RELATIVE PERCENT: 5.8 %
NEUTROPHILS ABSOLUTE COUNT: 5.8 10*9/L (ref 1.8–7.8)
NEUTROPHILS RELATIVE PERCENT: 78.1 %
PLATELET COUNT: 334 10*9/L (ref 150–450)
RED BLOOD CELL COUNT: 3.7 10*12/L — ABNORMAL LOW (ref 3.95–5.13)
RED CELL DISTRIBUTION WIDTH: 16.2 % — ABNORMAL HIGH (ref 12.2–15.2)
WBC ADJUSTED: 7.4 10*9/L (ref 3.6–11.2)

## 2022-03-10 LAB — SLIDE REVIEW

## 2022-03-10 LAB — HCG QUANTITATIVE, BLOOD: GONADOTROPIN, CHORIONIC (HCG) QUANT: <2.6 m[IU]/mL

## 2022-03-10 MED ADMIN — ondansetron (ZOFRAN) tablet 24 mg: 24 mg | ORAL | @ 17:00:00 | Stop: 2022-03-10

## 2022-03-10 MED ADMIN — vinCRIStine (ONCOVIN) 0.98 mg in sodium chloride (NS) 0.9 % 25 mL IVPB: .54 mg/m2 | INTRAVENOUS | @ 18:00:00 | Stop: 2022-03-10

## 2022-03-10 MED ADMIN — dexAMETHasone (DECADRON) tablet 8 mg: 8 mg | ORAL | @ 17:00:00 | Stop: 2022-03-10

## 2022-03-10 MED ADMIN — cycloPHOSphamide (CYTOXAN) 1,092 mg in sodium chloride (NS) 0.9 % 250 mL IVPB: 600 mg/m2 | INTRAVENOUS | @ 19:00:00 | Stop: 2022-03-10

## 2022-03-10 MED ADMIN — heparin, porcine (PF) 100 unit/mL injection 500 Units: 500 [IU] | INTRAVENOUS | @ 19:00:00 | Stop: 2022-03-11

## 2022-03-10 MED ADMIN — sodium chloride (NS) 0.9 % infusion: 100 mL/h | INTRAVENOUS | @ 18:00:00

## 2022-03-10 NOTE — Unmapped (Signed)
Patient presented for vincristine and cyclophosphamide infusions today. Patient denied any pain, shortness of breath or nausea during treatment; no s/s of reaction noted. Patient tolerated treatment well and was stable at time of discharge; self-ambulated to lobby with family.

## 2022-03-10 NOTE — Unmapped (Signed)
Lab on 03/10/2022   Component Date Value Ref Range Status    Sodium 03/10/2022 138  135 - 145 mmol/L Final    Potassium 03/10/2022 4.0  3.4 - 4.8 mmol/L Final    Chloride 03/10/2022 106  98 - 107 mmol/L Final    CO2 03/10/2022 27.0  20.0 - 31.0 mmol/L Final    Anion Gap 03/10/2022 5  5 - 14 mmol/L Final    BUN 03/10/2022 11  9 - 23 mg/dL Final    Creatinine 16/01/9603 0.79  0.55 - 1.02 mg/dL Final    BUN/Creatinine Ratio 03/10/2022 14   Final    eGFR CKD-EPI (2021) Female 03/10/2022 >90  >=60 mL/min/1.22m2 Final    eGFR calculated with CKD-EPI 2021 equation in accordance with [employer] and [employer] of Nephrology Task Force recommendations.    Glucose 03/10/2022 109  70 - 179 mg/dL Final    Calcium 54/12/8117 9.1  8.7 - 10.4 mg/dL Final    Albumin 14/78/2956 3.9  3.4 - 5.0 g/dL Final    Total Protein 03/10/2022 7.4  5.7 - 8.2 g/dL Final    Total Bilirubin 03/10/2022 0.4  0.3 - 1.2 mg/dL Final    AST 21/30/8657 14  <=34 U/L Final    ALT 03/10/2022 25  10 - 49 U/L Final    Alkaline Phosphatase 03/10/2022 77  46 - 116 U/L Final    hCG Quantitative 03/10/2022 <2.6  mIU/mL Final    WBC 03/10/2022 7.4  3.6 - 11.2 10*9/L Final    RBC 03/10/2022 3.70 (L)  3.95 - 5.13 10*12/L Final    HGB 03/10/2022 10.2 (L)  11.3 - 14.9 g/dL Final    HCT 84/69/6295 29.8 (L)  34.0 - 44.0 % Final    MCV 03/10/2022 80.5  77.6 - 95.7 fL Final    MCH 03/10/2022 27.6  25.9 - 32.4 pg Final    MCHC 03/10/2022 34.3  32.0 - 36.0 g/dL Final    RDW 28/41/3244 16.2 (H)  12.2 - 15.2 % Final    MPV 03/10/2022 7.7  6.8 - 10.7 fL Final    Platelet 03/10/2022 334  150 - 450 10*9/L Final    Neutrophils % 03/10/2022 78.1  % Final    Lymphocytes % 03/10/2022 13.4  % Final    Monocytes % 03/10/2022 5.8  % Final    Eosinophils % 03/10/2022 2.0  % Final    Basophils % 03/10/2022 0.7  % Final    Absolute Neutrophils 03/10/2022 5.8  1.8 - 7.8 10*9/L Final    Absolute Lymphocytes 03/10/2022 1.0 (L)  1.1 - 3.6 10*9/L Final    Absolute Monocytes 03/10/2022 0.4  0.3 - 0.8 10*9/L Final    Absolute Eosinophils 03/10/2022 0.2  0.0 - 0.5 10*9/L Final    Absolute Basophils 03/10/2022 0.1  0.0 - 0.1 10*9/L Final    Anisocytosis 03/10/2022 Slight (A)  Not Present Final          RED ZONE Means: RED ZONE: Take action now!     You need to be seen right away  Symptoms are at a severe level of discomfort    Call 911 or go to your nearest  Hospital for help     - Bleeding that will not stop    - Hard to breathe    - New seizure - Chest pain  - Fall or passing out  -Thoughts of hurting    yourself or others      Call 911  if you are going into the RED ZONE                  YELLOW ZONE Means:     Please call with any new or worsening symptom(s), even if not on this list.  Call [phone]  After hours, weekends, and holidays - you will reach a long recording with specific instructions, If not in an emergency such as above, please listen closely all the way to the end and choose the option that relates to your need.   You can be seen by a provider the same day through our Same Day Acute Care for Patients with Cancer program.      YELLOW ZONE: Take action today     Symptoms are new or worsening  You are not within your goal range for:    - Pain    - Shortness of breath    - Bleeding (nose, urine, stool, wound)    - Feeling sick to your stomach and throwing up    - Mouth sores/pain in your mouth or throat    - Hard stool or very loose stools (increase in       ostomy output)    - No urine for 12 hours    - Feeding tube or other catheter/tube issue    - Redness or pain at previous IV or port/catheter site    - Depressed or anxiety   - Swelling (leg, arm, abdomen,     face, neck)  - Skin rash or skin changes  - Wound issues (redness, drainage,    re-opened)  - Confusion  - Vision changes  - Fever >100.4 F or chills  - Worsening cough with mucus that is    green, yellow, or bloody  - Pain or burning when going to the    bathroom  - Home Infusion Pump Issue- call    [phone] Call your healthcare provider if you are going into the YELLOW ZONE     GREEN ZONE Means:  Your symptoms are under controls  Continue to take your medicine as ordered  Keep all visits to the provider GREEN ZONE: You are in control  No increase or worsening symptoms  Able to take your medicine  Able to drink and eat    - DO NOT use MyChart messages to report red or yellow symptoms. Allow up to 3    business days for a reply.  -MyChart is for non-urgent medication refills, scheduling requests, or other general questions.         [id number] Rev. 10/07/2021  Approved by Oncology Patient Education Committee

## 2022-03-10 NOTE — Unmapped (Signed)
It was a pleasure to see you in clinic today.    Here are instructions for the baking soda swish and spit:  - You should rinse your mouth (swish and spit) after meals and at bedtime with the following mixture of:   - ?? teaspoon of salt and ?? teaspoon of baking soda in 8 ounces of water  - You can prepare a daily supply of salt/baking soda rinse with 1 quart (4 cups) water, 1 teaspoon salt, 1 teaspoon baking soda (prepare new batch each day and dispose of old one).   - Additionally, avoid mouthwashes and toothpaste that contain alcohol and use a soft bristle toothbrush to clean your teeth.     - Return visit planned for 1 month in [place]    Thank you very much for allowing me to provide care for you today.  I appreciate your confidence in choosing our Gynecologic Oncology team at [facility].  If you have any questions about your visit today please call our office or send [place] a MyChart message and we will get back to you as soon as possible.

## 2022-03-10 NOTE — Unmapped (Signed)
Gynecologic Oncology Return Clinic Visit    Date of Service: 03/10/2022    Assessment & Plan:  Robin Gillespie is a 31 y.o. woman with  at least stage III:5 (possible III:7) GTD who presents for cycle 4 day 8 of EMA-CO.    GTN on EMA-CO:  - Labs reviewed and appropriate for treatment.   - Okay to proceed with treatment today.    - Patient will continue on OCPs.  - Surveillance with qmonthly hcg x1 year. Will get this in [place] and arrange follow-up in [place].    Peripheral neuropathy:  - Grade 2  - Continue gabapentin 300mg  BID. Discussed increasing to TID but patient wants to see how she feels after stopping chemo first.  - Will dose reduce vincristine today due to grade 2 neuropathy.    Oral Mucositis  - Grade 2 with diet modification but still able to eat/drink.  - Continue magic mouthwash as needed. Patient hasn't started baking soda swish and spit. Instructions provided again and patient encouraged to start.    RTC 1 month in [place].    [name], MD  Gynecologic Oncology      Medical Decision Making  I have independently reviewed notes from prior visits, results from labs and imaging. The current plan of care requires intensive monitoring for toxicity due to use of anti-neoplastic treatments. Medical decision making was of high complexity due to ongoing risk of morbidity/mortality from chemotherapy treatments.      -----------------------  Reason for Visit: Chemo monitoring    Treatment History:  Patient with a remote history of molar pregnancy in 2019 followed by undetectable hCG x6 months.  Most recent term pregnancy delivered via C-section in 02/2021.  Normal placental pathology.  Following delivery, patient was scheduled for IUD placement and was noted to have a positive urine pregnancy test 04/07/2021.  Due to persistently positive hCG, she underwent a D&C in May with negative pathology.  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  Brain MRI negative for metastases.    Interval History:  Patient presents today with her mom.  She reports that she is overall doing well.  She does note ongoing numbness and tingling in her fingertips which she says affects her ability to do fine tasks like tying the strings on ornaments.  She also notes that the mucositis was worse after her most recent EMA treatment requiring her to focus on eating softer foods.  She uses the Magic mouthwash which she says helps temporarily but is short acting.  She has not yet tried the baking soda swish and spit.  She continues to take her OCPs without issue.      Past Medical/Surgical History:  Past Medical History:   Diagnosis Date    Gestational trophoblastic disease        Past Surgical History:   Procedure Laterality Date    CESAREAN SECTION      IR INSERT PORT AGE GREATER THAN 5 YRS  01/12/2022    IR INSERT PORT AGE GREATER THAN 5 YRS 01/12/2022 [name], MD [facility]       No family history on file.    Social History     Socioeconomic History    Marital status: Single   Tobacco Use    Smoking status: Never    Smokeless tobacco: Never       Current Medications:    Current Outpatient Medications:  empty container (SHARPS CONTAINER) Misc, Use as directed, Disp: 1 each, Rfl: 2    filgrastim (NEUPOGEN) 300 mcg/mL injection, Inject 1 mL (300 mcg total) under the skin 24 hours after completion chemotherapy. Repeat every 24 hours for 3 days., Disp: 6 mL, Rfl: 3    gabapentin (NEURONTIN) 300 MG capsule, Take 1 capsule (300 mg total) by mouth two (2) times a day., Disp: 60 capsule, Rfl: 0    ibuprofen (MOTRIN) 600 MG tablet, Take 1 tablet (600 mg total) by mouth every eight (8) hours as needed for pain., Disp: , Rfl:     loratadine (CLARITIN) 10 mg tablet, Take 1 tablet (10 mg total) by mouth daily. Start taking 1 day prior to filgrastim injections and continue until 1 day after last injection, Disp: 30 tablet, Rfl: 0    magic mouthwash suspension, Swish and spit 10 mls 4 times a day as needed., Disp: 240 mL, Rfl: 0    norethindrone-e.estradiol-iron (LOESTRIN FE 1/20, 28, ORAL), Take 1 tablet by mouth in the morning., Disp: , Rfl:     prochlorperazine (COMPAZINE) 10 MG tablet, Take 1 tablet (10 mg total) by mouth every six (6) hours as needed (breakthrough nausea or vomiting)., Disp: 30 tablet, Rfl: 0    senna (SENOKOT) 8.6 mg tablet, Take 2 tablets by mouth nightly as needed for constipation., Disp: 30 tablet, Rfl: 0    syringe with needle (BD LUER-LOK SYRINGE) 3 mL 25 x 5/8 Syrg, Use to inject neupogen at home under the skin, Disp: 6 each, Rfl: 3    Review of Symptoms:  Complete 10-system review is negative except as above in Interval History.    Physical Exam:  BP 139/68  - Pulse 102  - Temp 36.2 ??C (97.1 ??F) (Temporal)  - Resp 17  - Ht 152.4 cm (5')  - Wt 82.2 kg (181 lb 4.8 oz)  - SpO2 100%  - BMI 35.41 kg/m??   General: Alert, oriented, no acute distress.  HEENT: Normocephalic, atraumatic. Neck symmetric without masses. Sclera anicteric.  Superficial fissures of her lateral tongue and few ulcerations under her tongue with sloughing.    Chest: Normal work of breathing. Clear to auscultation bilaterally. Port site clean.  Cardiovascular: Regular rate and rhythm, no murmurs.  Abdomen: Soft, nontender.  Normoactive bowel sounds.    Extremities: Grossly normal range of motion.  Warm, well perfused.  No edema bilaterally.  Skin: No rashes or lesions noted.      Laboratory & Radiologic Studies:  Lab Results   Component Value Date    WBC 7.4 03/10/2022    HGB 10.2 (L) 03/10/2022    HCT 29.8 (L) 03/10/2022    PLT 334 03/10/2022    NEUTROABS 5.8 03/10/2022     Lab Results   Component Value Date    NA 138 03/10/2022    K 4.0 03/10/2022    CL 106 03/10/2022    CO2 27.0 03/10/2022    ANIONGAP 5 03/10/2022    BUN 11 03/10/2022    CREATININE 0.79 03/10/2022    BCR 14 03/10/2022    GLU 109 03/10/2022    CALCIUM 9.1 03/10/2022    ALBUMIN 3.9 03/10/2022    PROT 7.4 03/10/2022    BILITOT 0.4 03/10/2022    AST 14 03/10/2022    ALT 25 03/10/2022    ALKPHOS 77 03/10/2022     Lab Results   Component Value Date    HCGQUANT <2.6 03/10/2022    HCGQUANT <2.6 03/03/2022  HCGQUANT <2.6 02/24/2022

## 2022-03-10 NOTE — Unmapped (Unsigned)
Patient was screened high risk for falls, the following steps were taken to reduce the risk of falls:    - Patient oriented to environment  - Physical environment was clear of slip/trip hazards  - Footwear observed to ensure it was appropriate, if not, patient advised on importance of well fitted, closed toe, sturdy shoes with non-skid soles  - Instructed patient and/or caregiver that patient is at risk for falls and encouraged requesting assistance before standing, ambulating or transferring  - ???Preventing falls: Care instructions??? placed in patient AVS

## 2022-03-12 ENCOUNTER — Other Ambulatory Visit: Payer: Self-pay | Admitting: Psychiatry

## 2022-03-12 DIAGNOSIS — O019 Hydatidiform mole, unspecified: Secondary | ICD-10-CM

## 2022-03-13 ENCOUNTER — Telehealth: Payer: Self-pay

## 2022-03-13 NOTE — Telephone Encounter (Signed)
Per Dr. Ernestina Patches Can we please schedule this patient for follow-up with me in 1 month. She will also need monthly beta-hcg lab draws. Her first one can be on the day she follows up with me. I have placed monthly standing orders x12 for the beta-hcg.   I LVM for patient to call office to schedule.

## 2022-03-14 NOTE — Telephone Encounter (Signed)
Per Dr. Ernestina Patches, I spoke to patient, pt appointments are as follows 1 month follow up: 04/19/22 Lab Bhcg: 04/19/22 Lab Bhcg: 05/22/22 Lab Bhcg: 06/19/22  Pt agreed to date/times and only wanted to schedule 3 months at a time.   I have put her on the follow up list to call in March to schedule next 3 labs.

## 2022-03-17 NOTE — Unmapped (Signed)
Robin Gillespie has been contacted in regards to their refill of Neupogen. At this time, they have declined refill due to  pt states they are no longer taking medication .

## 2022-03-18 NOTE — Unmapped (Signed)
Specialty Medication(s): Neupogen    Robin Gillespie has been dis-enrolled from the [facility] specialty pharmacy services due to therapy completion - expected therapy completion date:   .    Additional information provided to the patient: per patient    Robin Gillespie, PharmD  [facility] Specialty Pharmacist

## 2022-04-05 ENCOUNTER — Telehealth: Payer: Self-pay | Admitting: *Deleted

## 2022-04-05 NOTE — Telephone Encounter (Signed)
LMOM for the patient to call the office back. Patient's appt needs to be moved from 1/10 to 1/3

## 2022-04-06 NOTE — Telephone Encounter (Signed)
Pt returning call. She has been rescheduled to 04/24/22 @ 3:30 lab @ 4:00

## 2022-04-19 ENCOUNTER — Ambulatory Visit: Payer: Medicaid Other | Admitting: Psychiatry

## 2022-04-19 ENCOUNTER — Ambulatory Visit: Payer: Medicaid Other

## 2022-04-24 ENCOUNTER — Inpatient Hospital Stay: Payer: Medicaid Other | Attending: Psychiatry | Admitting: Psychiatry

## 2022-04-24 ENCOUNTER — Other Ambulatory Visit: Payer: Self-pay | Admitting: Gynecologic Oncology

## 2022-04-24 ENCOUNTER — Other Ambulatory Visit: Payer: Self-pay

## 2022-04-24 ENCOUNTER — Inpatient Hospital Stay: Payer: Medicaid Other

## 2022-04-24 VITALS — BP 122/68 | HR 75 | Temp 99.1°F | Resp 14 | Wt 178.8 lb

## 2022-04-24 DIAGNOSIS — F4321 Adjustment disorder with depressed mood: Secondary | ICD-10-CM | POA: Diagnosis not present

## 2022-04-24 DIAGNOSIS — C7800 Secondary malignant neoplasm of unspecified lung: Secondary | ICD-10-CM

## 2022-04-24 DIAGNOSIS — O019 Hydatidiform mole, unspecified: Secondary | ICD-10-CM | POA: Diagnosis not present

## 2022-04-24 DIAGNOSIS — Z79899 Other long term (current) drug therapy: Secondary | ICD-10-CM | POA: Diagnosis not present

## 2022-04-24 DIAGNOSIS — Z3A00 Weeks of gestation of pregnancy not specified: Secondary | ICD-10-CM | POA: Diagnosis not present

## 2022-04-24 DIAGNOSIS — G629 Polyneuropathy, unspecified: Secondary | ICD-10-CM | POA: Diagnosis not present

## 2022-04-24 DIAGNOSIS — Z95828 Presence of other vascular implants and grafts: Secondary | ICD-10-CM

## 2022-04-24 MED ORDER — HEPARIN SOD (PORK) LOCK FLUSH 100 UNIT/ML IV SOLN
500.0000 [IU] | Freq: Once | INTRAVENOUS | Status: AC | PRN
  Administered 2022-04-24: 500 [IU]

## 2022-04-24 MED ORDER — SODIUM CHLORIDE 0.9% FLUSH
10.0000 mL | INTRAVENOUS | Status: DC | PRN
  Administered 2022-04-24: 10 mL

## 2022-04-24 NOTE — Patient Instructions (Signed)
It was a pleasure to see you in clinic today. - The medication is called zoloft (sertraline). I usually started at a low dose and increase over time. - Continue with monthly beta-hcg. - We will flush your port every 3 months. - Return visit planned for 3 months.  Thank you very much for allowing me to provide care for you today.  I appreciate your confidence in choosing our Gynecologic Oncology team at [facility].  If you have any questions about your visit today please call our office or send [place] a MyChart message and we will get back to you as soon as possible.

## 2022-04-24 NOTE — Progress Notes (Signed)
Gynecologic Oncology Return Clinic Visit  Date of Service: 04/24/2022 Referring Provider: Sherlyn Hay, DO   Assessment & Plan: Lynn Tucker is a 32 y.o. 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  GTN: - Port flushed today. Flush q3 months. Continue with port at least 6 months but recommend 12 months. - Patient will continue on OCPs. - Surveillance with qmonthly hcg x1 year. Visits q3 months.  Peripheral neuropathy: - Grade 1 - Continue gabapentin. She is only taking daily.  Situational depression: - Discussed options for treatment to include counseling +/- medications.  - No SI/HI - Referral to social work. - Pt declines meds at this time but will consider.   Preventative screening: - Last pap 07/04/18 NILM (no HPV performed) - Will collect pap with cotesting at next visit.  RTC 3 months.  Bernadene Bell, MD Gynecologic Oncology   Medical Decision Making I personally spent  TOTAL 30 minutes face-to-face and non-face-to-face in the care of this patient, which includes all pre, intra, and post visit time on the date of service.   ----------------------- Reason for Visit: Surveillance  Treatment History: Oncology History Overview Note  Patient with a remote history of molar pregnancy in 2019 followed by undetectable hCG x6 months. Most recent term pregnancy delivered via C-section in 02/2021. Normal placental pathology. Following delivery, patient was scheduled for IUD placement and was noted to have a positive urine pregnancy test 04/07/2021. Due to persistently positive hCG, she underwent a D&C in May with negative pathology. 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 Heterogeneous appearance of the uterus, nonspecific likely reflects patient's reported  gestational trophoblastic neoplasia. 2. Solid pulmonary nodules in the right lower lobe and lingula are suspicious for metastatic disease.   01/03/2022 Initial Diagnosis   Gestational trophoblastic neoplasia   01/08/2022 Imaging   Brain MRI: IMPRESSION: 1. No evidence of intracranial metastatic disease. 2. Small amount of scattered foci of T2 hyperintensity within the white matter of the cerebral hemispheres, nonspecific, more pronounced than expected for age. Differential diagnosis include demyelinating disease, vasculitis, migraine, post inflammatory/infectious processes or early chronic microangiopathy in the setting of uncontrolled risk factors. 3. Small disc herniation at C3-4 appear to result in at least mild spinal canal stenosis.   01/20/2022 - 03/17/2022 Chemotherapy   EMA-CO x4 cycles (2 cycles past negative beta-hcg)   Malignant neoplasm metastatic to lung ([place])  01/03/2022 Initial Diagnosis   Malignant neoplasm metastatic to lung [facility])     Interval History: Pt reports that her numbness is much better. No longer having much tingling. Just in the fingertips. She continues with daily gabapentin. Her mouth sores have resolved. She continues with OCPs without issue. Reports light bleeding or none when on the placebo pills. Does report some depressed mood in adjusting to her diagnosis.    Past Medical/Surgical History: Past Medical History:  Diagnosis Date   ADHD (attention deficit hyperactivity disorder)    Chlamydia 11/11/2015   H. pylori infection 04/2014   off antibiotics at this time   Heart murmur    Hyperlipidemia    Impaired fasting glucose    2013   Molar pregnancy 04/25/2017   Obesity    Pre-diabetes    Short cervix during pregnancy in second trimester 09/21/2018   Sickle cell trait (HCC)    SVD (spontaneous vaginal delivery) 01/31/2019  Ventricular septal defect (VSD)    VSD (ventricular septal defect)    followed by cardiology every 2 years   Wears  glasses     Past Surgical History:  Procedure Laterality Date   CERVICAL CERCLAGE N/A 09/22/2018   Procedure: CERCLAGE CERVICAL;  Surgeon: Janyth Contes, MD;  [facility]: [facility];  Service: Gynecology;  Laterality: N/A;   CESAREAN SECTION N/A 02/26/2021   Procedure: CESAREAN SECTION;  Surgeon: Rowland Lathe, MD;  Location: [facility];  Service: Obstetrics;  Laterality: N/A;  Primary C/S for Fetal Intolerance to Liberal N/A 08/05/2015   Procedure: DILATATION AND EVACUATION;  Surgeon: Marylynn Pearson, MD;  Location: [place] [facility];  Service: Gynecology;  Laterality: N/A;   DILATION AND EVACUATION N/A 04/25/2017   Procedure: DILATATION AND EVACUATION;  Surgeon: Janyth Contes, MD;  Location: [place] [facility];  Service: Gynecology;  Laterality: N/A;   HYSTEROSCOPY WITH D & C N/A 08/12/2021   Procedure: DILATATION AND CURETTAGE /HYSTEROSCOPY;  Surgeon: Sherlyn Hay, DO;  Location: [place];  Service: Gynecology;  Laterality: N/A;   WISDOM TOOTH EXTRACTION      Family History  Problem Relation Age of Onset   Hypertension Mother    Sickle cell trait Father    Hypertension Father    Thyroid disease Paternal Aunt    Cancer Other        maternal side-? type   Hyperlipidemia Other        ? side   Stroke Other        paternal side   Diabetes Other        paternal and maternal side   Breast cancer Neg Hx    Ovarian cancer Neg Hx    Colon cancer Neg Hx    Uterine cancer Neg Hx     Social History   Socioeconomic History   Marital status: Single    Spouse name: Not on file   Number of children: Not on file   Years of education: Not on file   Highest education level: Not on file  Occupational History   Not on file  Tobacco Use   Smoking status: Former    Types: Cigarettes   Smokeless tobacco: Never  Vaping Use   Vaping Use: Never used  Substance and Sexual Activity   Alcohol use: No    Alcohol/week: 1.0 standard drink of alcohol     Types: 1 Standard drinks or equivalent per week    Comment: random   Drug use: No    Comment: THC in past 30 days per pt on 12/26/21   Sexual activity: Yes    Partners: Male    Birth control/protection: None  Other Topics Concern   Not on file  Social History Narrative   Working at pharmacy, Clinical cytogeneticist.  Exercise - walking.  Has 31yo.   06/2020.   Social Determinants of Health   Financial Resource Strain: Low Risk  (09/20/2018)   Overall Financial Resource Strain (CARDIA)    Difficulty of Paying Living Expenses: Not hard at all  Food Insecurity: No Food Insecurity (09/20/2018)   Hunger Vital Sign    Worried About Running Out of Food in the Last Year: Never true    Ran Out of Food in the Last Year: Never true  Transportation Needs: Unknown (09/20/2018)   PRAPARE - Hydrologist (Medical): No    Lack of Transportation (Non-Medical): Not on file  Physical Activity: Not  on file  Stress: Stress Concern Present (09/20/2018)   [place]    Feeling of Stress : To some extent  Social Connections: Not on file    Current Medications:  Current Outpatient Medications:    ALPRAZolam (XANAX) 0.5 MG tablet, Take 1 tablet (0.5 mg total) by mouth once as needed for up to 1 dose for anxiety (For MRI)., Disp: 1 tablet, Rfl: 0   gabapentin (NEURONTIN) 300 MG capsule, Take by mouth., Disp: , Rfl:    ibuprofen (ADVIL) 800 MG tablet, Take 1 tablet (800 mg total) by mouth every 8 (eight) hours as needed for moderate pain or cramping., Disp: 20 tablet, Rfl: 1   loratadine (CLARITIN) 10 MG tablet, Take 1 tablet by mouth daily., Disp: , Rfl:   Review of Symptoms: Complete 10-system review is negative except as above in Interval History.  Physical Exam: BP 122/68 (BP Location: Right Arm, Patient Position: Sitting)   Pulse 75   Temp 99.1 F (37.3 C) (Oral)   Resp 14   Wt 178 lb 12.8 oz (81.1 kg)   SpO2  100%   BMI 34.92 kg/m  General: Alert, oriented, no acute distress. HEENT: Normocephalic, atraumatic. Neck symmetric without masses. Sclera anicteric.  Chest: Normal work of breathing. Clear to auscultation bilaterally.  Port site clean. Cardiovascular: Regular rate and rhythm, no murmurs. Abdomen: Soft, nontender.  Normoactive bowel sounds.  No masses or hepatosplenomegaly appreciated.   Extremities: Grossly normal range of motion.  Warm, well perfused.  No edema bilaterally. Skin: No rashes or lesions noted. Lymphatics: No cervical, supraclavicular, or inguinal adenopathy. GU: Normal appearing external genitalia without erythema, excoriation, or lesions.  Speculum exam reveals normal vagina and cervix.  Bimanual exam reveals  normal cevix and uterus; no adnexal masses.  Exam chaperoned  Laboratory & Radiologic Studies:   Lab Results  Component Value Date/Time   HCGQUANT <1 04/24/2022 03:17 PM

## 2022-04-25 LAB — BETA HCG QUANT (REF LAB): hCG Quant: <1 m[IU]/mL

## 2022-04-26 ENCOUNTER — Inpatient Hospital Stay: Payer: Medicaid Other | Admitting: Licensed Clinical Social Worker

## 2022-04-26 NOTE — Progress Notes (Signed)
[place] Work  Clinical Social Work was referred by medical provider for assessment of psychosocial needs.  Clinical Social Worker contacted patient by phone  to offer support and assess for needs.   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 She does have good support from friends and family. She is trying to readjust being back at work and still processing health concerns.  CSW offered counseling appointment. Pt opted to take CSW's contact information and call back after she is able to think about it and when she has more availability to talk.     Cherisse Carrell E Valin Massie, LCSW  Clinical Social Worker [place]        Patient is participating in a Managed Medicaid Plan:  Yes

## 2022-04-30 ENCOUNTER — Encounter: Payer: Self-pay | Admitting: Psychiatry

## 2022-04-30 ENCOUNTER — Encounter: Payer: Self-pay | Admitting: Gynecologic Oncology

## 2022-05-22 ENCOUNTER — Other Ambulatory Visit: Payer: Medicaid Other

## 2022-05-22 ENCOUNTER — Inpatient Hospital Stay: Payer: Medicaid Other | Attending: Psychiatry

## 2022-05-22 ENCOUNTER — Other Ambulatory Visit: Payer: Self-pay

## 2022-05-22 DIAGNOSIS — Z95828 Presence of other vascular implants and grafts: Secondary | ICD-10-CM

## 2022-05-22 DIAGNOSIS — C58 Malignant neoplasm of placenta: Secondary | ICD-10-CM

## 2022-05-22 DIAGNOSIS — C7800 Secondary malignant neoplasm of unspecified lung: Secondary | ICD-10-CM

## 2022-05-22 DIAGNOSIS — O019 Hydatidiform mole, unspecified: Secondary | ICD-10-CM | POA: Insufficient documentation

## 2022-05-22 MED ORDER — HEPARIN SOD (PORK) LOCK FLUSH 100 UNIT/ML IV SOLN
500.0000 [IU] | Freq: Once | INTRAVENOUS | Status: AC | PRN
  Administered 2022-05-22: 500 [IU]

## 2022-05-22 MED ORDER — SODIUM CHLORIDE 0.9% FLUSH
10.0000 mL | INTRAVENOUS | Status: DC | PRN
  Administered 2022-05-22: 10 mL

## 2022-05-23 LAB — BETA HCG QUANT (REF LAB): hCG Quant: <1 m[IU]/mL

## 2022-06-14 ENCOUNTER — Telehealth: Payer: Self-pay | Admitting: *Deleted

## 2022-06-14 NOTE — Telephone Encounter (Signed)
Patient returned call, she is now scheduled out through December for port flush w/lab

## 2022-06-14 NOTE — Telephone Encounter (Signed)
LMOM for the patient to call the office back. Patient needs to be scheduled for labs once a month until December

## 2022-06-19 ENCOUNTER — Other Ambulatory Visit: Payer: Self-pay

## 2022-06-19 ENCOUNTER — Other Ambulatory Visit: Payer: Medicaid Other

## 2022-06-19 ENCOUNTER — Inpatient Hospital Stay: Payer: Medicaid Other | Attending: Psychiatry

## 2022-06-19 DIAGNOSIS — O019 Hydatidiform mole, unspecified: Secondary | ICD-10-CM | POA: Diagnosis not present

## 2022-06-19 DIAGNOSIS — C7800 Secondary malignant neoplasm of unspecified lung: Secondary | ICD-10-CM

## 2022-06-19 DIAGNOSIS — C58 Malignant neoplasm of placenta: Secondary | ICD-10-CM

## 2022-06-19 DIAGNOSIS — Z95828 Presence of other vascular implants and grafts: Secondary | ICD-10-CM

## 2022-06-19 MED ORDER — HEPARIN SOD (PORK) LOCK FLUSH 100 UNIT/ML IV SOLN
500.0000 [IU] | Freq: Once | INTRAVENOUS | Status: AC | PRN
  Administered 2022-06-19: 500 [IU]

## 2022-06-19 MED ORDER — SODIUM CHLORIDE 0.9% FLUSH
10.0000 mL | INTRAVENOUS | Status: DC | PRN
  Administered 2022-06-19: 10 mL

## 2022-06-21 LAB — BETA HCG QUANT (REF LAB): hCG Quant: <1 m[IU]/mL

## 2022-07-20 ENCOUNTER — Encounter: Payer: Self-pay | Admitting: Psychiatry

## 2022-07-24 ENCOUNTER — Inpatient Hospital Stay: Payer: Medicaid Other | Attending: Psychiatry | Admitting: Psychiatry

## 2022-07-24 ENCOUNTER — Other Ambulatory Visit: Payer: Self-pay

## 2022-07-24 ENCOUNTER — Inpatient Hospital Stay: Payer: Medicaid Other

## 2022-07-24 ENCOUNTER — Other Ambulatory Visit (HOSPITAL_COMMUNITY)
Admission: RE | Admit: 2022-07-24 | Discharge: 2022-07-24 | Disposition: A | Discharge status: Home / Self Care | Payer: Medicaid Other | Source: Ambulatory Visit | Attending: Psychiatry | Admitting: Psychiatry

## 2022-07-24 VITALS — BP 111/63 | HR 72 | Temp 98.9°F | Resp 17 | Wt 178.4 lb

## 2022-07-24 DIAGNOSIS — Z113 Encounter for screening for infections with a predominantly sexual mode of transmission: Secondary | ICD-10-CM

## 2022-07-24 DIAGNOSIS — Z9221 Personal history of antineoplastic chemotherapy: Secondary | ICD-10-CM | POA: Diagnosis not present

## 2022-07-24 DIAGNOSIS — R4589 Other symptoms and signs involving emotional state: Secondary | ICD-10-CM | POA: Diagnosis not present

## 2022-07-24 DIAGNOSIS — G620 Drug-induced polyneuropathy: Secondary | ICD-10-CM

## 2022-07-24 DIAGNOSIS — O019 Hydatidiform mole, unspecified: Secondary | ICD-10-CM

## 2022-07-24 DIAGNOSIS — Z95828 Presence of other vascular implants and grafts: Secondary | ICD-10-CM

## 2022-07-24 DIAGNOSIS — O020 Blighted ovum and nonhydatidiform mole: Secondary | ICD-10-CM | POA: Diagnosis not present

## 2022-07-24 DIAGNOSIS — T451X5S Adverse effect of antineoplastic and immunosuppressive drugs, sequela: Secondary | ICD-10-CM | POA: Diagnosis not present

## 2022-07-24 DIAGNOSIS — C7800 Secondary malignant neoplasm of unspecified lung: Secondary | ICD-10-CM

## 2022-07-24 DIAGNOSIS — Z8759 Personal history of other complications of pregnancy, childbirth and the puerperium: Secondary | ICD-10-CM | POA: Insufficient documentation

## 2022-07-24 DIAGNOSIS — C58 Malignant neoplasm of placenta: Secondary | ICD-10-CM

## 2022-07-24 MED ORDER — HEPARIN SOD (PORK) LOCK FLUSH 100 UNIT/ML IV SOLN
500.0000 [IU] | Freq: Once | INTRAVENOUS | Status: AC | PRN
  Administered 2022-07-24: 500 [IU]

## 2022-07-24 MED ORDER — SODIUM CHLORIDE 0.9% FLUSH
10.0000 mL | INTRAVENOUS | Status: DC | PRN
  Administered 2022-07-24: 10 mL

## 2022-07-24 NOTE — Patient Instructions (Signed)
It was a pleasure to see you in clinic today. - Labs next week. - Continue monthly beta check - Return visit planned for 3 months and port flush.  Thank you very much for allowing me to provide care for you today.  I appreciate your confidence in choosing our Gynecologic Oncology team at [facility].  If you have any questions about your visit today please call our office or send [place] a MyChart message and we will get back to you as soon as possible.

## 2022-07-24 NOTE — Progress Notes (Unsigned)
Gynecologic Oncology Return Clinic Visit  Date of Service: 07/24/2022 Referring Provider: Edwinna Areola, DO   Assessment & Plan: Lynn Tucker is a 32 y.o. 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  GTN: - Port flushed today. Flush q3 months. Continue with port at least 6 months but recommend 12 months. - Patient will continue on OCPs. - Surveillance with qmonthly hcg x1 year. Visits q3 months.  Peripheral neuropathy: - Grade 1 - Continue gabapentin. She is only taking daily.  Situational depression: - Discussed options for treatment to include counseling +/- medications.  - No SI/HI - Referral to social work. - Pt declines meds at this time but will consider.   Preventative screening: - Last pap 07/04/18 NILM (no HPV performed) - Will collect pap with cotesting at next visit.  RTC 3 months.  [name], MD Gynecologic Oncology   Medical Decision Making I personally spent  TOTAL 30 minutes face-to-face and non-face-to-face in the care of this patient, which includes all pre, intra, and post visit time on the date of service.   ----------------------- Reason for Visit: Surveillance  Treatment History: Oncology History Overview Note  Patient with a remote history of molar pregnancy in 2019 followed by undetectable hCG x6 months. Most recent term pregnancy delivered via C-section in 02/2021. Normal placental pathology. Following delivery, patient was scheduled for IUD placement and was noted to have a positive urine pregnancy test 04/07/2021. Due to persistently positive hCG, she underwent a D&C in May with negative pathology. 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 Heterogeneous appearance of the uterus, nonspecific likely reflects patient's reported  gestational trophoblastic neoplasia. 2. Solid pulmonary nodules in the right lower lobe and lingula are suspicious for metastatic disease.   01/03/2022 Initial Diagnosis   Gestational trophoblastic neoplasia   01/08/2022 Imaging   Brain MRI: IMPRESSION: 1. No evidence of intracranial metastatic disease. 2. Small amount of scattered foci of T2 hyperintensity within the white matter of the cerebral hemispheres, nonspecific, more pronounced than expected for age. Differential diagnosis include demyelinating disease, vasculitis, migraine, post inflammatory/infectious processes or early chronic microangiopathy in the setting of uncontrolled risk factors. 3. Small disc herniation at C3-4 appear to result in at least mild spinal canal stenosis.   01/20/2022 - 03/17/2022 Chemotherapy   EMA-CO x4 cycles (2 cycles past negative beta-hcg)   Malignant neoplasm metastatic to lung  01/03/2022 Initial Diagnosis   Malignant neoplasm metastatic to lung [facility])     Interval History: Stopping ocp's felt better Stopped last month Mood better off of it. Wants the iud. Needs to talk to ob/gyn No gabapetin; feelin all back to normal Desires std testing   ***Pt reports that her numbness is much better. No longer having much tingling. Just in the fingertips. She continues with daily gabapentin. Her mouth sores have resolved. She continues with OCPs without issue. Reports light bleeding or none when on the placebo pills. Does report some depressed mood in adjusting to her diagnosis.    Past Medical/Surgical History: Past Medical History:  Diagnosis Date   ADHD (attention deficit hyperactivity disorder)    Chlamydia 11/11/2015   H. pylori infection 04/2014   off antibiotics at this time   Heart murmur    Hyperlipidemia    Impaired fasting glucose    2013   Molar pregnancy 04/25/2017   Obesity  Pre-diabetes    Short cervix during pregnancy in second trimester 09/21/2018   Sickle cell trait     SVD (spontaneous vaginal delivery) 01/31/2019   Ventricular septal defect (VSD)    VSD (ventricular septal defect)    followed by cardiology every 2 years   Wears glasses     Past Surgical History:  Procedure Laterality Date   CERVICAL CERCLAGE N/A 09/22/2018   Procedure: CERCLAGE CERVICAL;  Surgeon: Sherian Rein, MD;  [facility]: [facility];  Service: Gynecology;  Laterality: N/A;   CESAREAN SECTION N/A 02/26/2021   Procedure: CESAREAN SECTION;  Surgeon: Charlett Nose, MD;  Location: [facility];  Service: Obstetrics;  Laterality: N/A;  Primary C/S for Fetal Intolerance to Labor   DILATION AND EVACUATION N/A 08/05/2015   Procedure: DILATATION AND EVACUATION;  Surgeon: Zelphia Cairo, MD;  Location: [facility];  Service: Gynecology;  Laterality: N/A;   DILATION AND EVACUATION N/A 04/25/2017   Procedure: DILATATION AND EVACUATION;  Surgeon: Sherian Rein, MD;  Location: [facility];  Service: Gynecology;  Laterality: N/A;   HYSTEROSCOPY WITH D & C N/A 08/12/2021   Procedure: DILATATION AND CURETTAGE /HYSTEROSCOPY;  Surgeon: Edwinna Areola, DO;  Location: [facility];  Service: Gynecology;  Laterality: N/A;   WISDOM TOOTH EXTRACTION      Family History  Problem Relation Age of Onset   Hypertension Mother    Sickle cell trait Father    Hypertension Father    Thyroid disease Paternal Aunt    Cancer Other        maternal side-? type   Hyperlipidemia Other        ? side   Stroke Other        paternal side   Diabetes Other        paternal and maternal side   Breast cancer Neg Hx    Ovarian cancer Neg Hx    Colon cancer Neg Hx    Uterine cancer Neg Hx     Social History   Socioeconomic History   Marital status: Single    Spouse name: Not on file   Number of children: Not on file   Years of education: Not on file   Highest education level: Not on file  Occupational History   Not on file  Tobacco Use   Smoking status: Former    Types: Cigarettes    Smokeless tobacco: Never  Vaping Use   Vaping Use: Never used  Substance and Sexual Activity   Alcohol use: No    Alcohol/week: 1.0 standard drink of alcohol    Types: 1 Standard drinks or equivalent per week    Comment: random   Drug use: No    Comment: THC in past 30 days per pt on 12/26/21   Sexual activity: Yes    Partners: Male    Birth control/protection: None  Other Topics Concern   Not on file  Social History Narrative   Working at pharmacy, 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   06/2020.   Social Determinants of Health   Financial Resource Strain: Low Risk  (09/20/2018)   Overall Financial Resource Strain (CARDIA)    Difficulty of Paying Living Expenses: Not hard at all  Food Insecurity: No Food Insecurity (09/20/2018)   Hunger Vital Sign    Worried About Running Out of Food in the Last Year: Never true    Ran Out of Food in the Last Year: Never true  Transportation Needs: Unknown (09/20/2018)  PRAPARE - Administrator, Civil Service (Medical): No    Lack of Transportation (Non-Medical): Not on file  Physical Activity: Not on file  Stress: Stress Concern Present (09/20/2018)   [employer] of Occupational Health - Occupational Stress Questionnaire    Feeling of Stress : To some extent  Social Connections: Not on file    Current Medications:  Current Outpatient Medications:    norethindrone-ethinyl estradiol-FE (LOESTRIN FE) 1-20 MG-MCG tablet, Take 1 tablet by mouth daily., Disp: , Rfl:    gabapentin (NEURONTIN) 300 MG capsule, Take by mouth., Disp: , Rfl:    loratadine (CLARITIN) 10 MG tablet, Take 10 mg by mouth daily as needed for allergies. (Patient not taking: Reported on 07/20/2022), Disp: , Rfl:  No current facility-administered medications for this visit.  Facility-Administered Medications Ordered in Other Visits:    sodium chloride flush (NS) 0.9 % injection 10 mL, 10 mL, Intracatheter, PRN, Cross, Melissa D, NP, 10 mL at 07/24/22  1339  Review of Symptoms: Complete 10-system review is positive for: change in appetite  Physical Exam: BP 111/63 (BP Location: Left Arm, Patient Position: Sitting)   Pulse 72   Temp 98.9 F (37.2 C) (Oral)   Resp 17   Wt 178 lb 6.4 oz (80.9 kg)   SpO2 100%   BMI 34.84 kg/m  General: Alert, oriented, no acute distress. HEENT: Normocephalic, atraumatic. Neck symmetric without masses. Sclera anicteric.  Chest: Normal work of breathing. Clear to auscultation bilaterally.  Port site clean. Cardiovascular: Regular rate and rhythm, no murmurs. Abdomen: Soft, nontender.  Normoactive bowel sounds.  No masses or hepatosplenomegaly appreciated.   Extremities: Grossly normal range of motion.  Warm, well perfused.  No edema bilaterally. Skin: No rashes or lesions noted. Lymphatics: No cervical, supraclavicular, or inguinal adenopathy. GU: Normal appearing external genitalia without erythema, excoriation, or lesions.  Speculum exam reveals normal vagina and cervix.  Bimanual exam reveals  normal cevix and uterus; no adnexal masses.  Exam chaperoned  Dark spot left lower abdomen  Laboratory & Radiologic Studies:   Lab Results  Component Value Date/Time   HCGQUANT <1 06/19/2022 08:55 AM

## 2022-07-25 LAB — CERVICOVAGINAL ANCILLARY ONLY
Chlamydia: NEGATIVE
Comment: NEGATIVE
Comment: NEGATIVE
Comment: NORMAL
Neisseria Gonorrhea: NEGATIVE
Trichomonas: NEGATIVE

## 2022-07-25 LAB — BETA HCG QUANT (REF LAB): hCG Quant: <1 m[IU]/mL

## 2022-07-26 ENCOUNTER — Encounter: Payer: Self-pay | Admitting: Gynecologic Oncology

## 2022-07-26 ENCOUNTER — Encounter: Payer: Self-pay | Admitting: Psychiatry

## 2022-07-27 LAB — CYTOLOGY - PAP
Comment: NEGATIVE
High risk HPV: NEGATIVE

## 2022-07-28 ENCOUNTER — Encounter: Payer: Self-pay | Admitting: Psychiatry

## 2022-08-02 ENCOUNTER — Other Ambulatory Visit: Payer: Self-pay

## 2022-08-02 ENCOUNTER — Inpatient Hospital Stay: Payer: Medicaid Other

## 2022-08-02 DIAGNOSIS — R4589 Other symptoms and signs involving emotional state: Secondary | ICD-10-CM | POA: Diagnosis not present

## 2022-08-02 DIAGNOSIS — Z113 Encounter for screening for infections with a predominantly sexual mode of transmission: Secondary | ICD-10-CM

## 2022-08-02 DIAGNOSIS — O020 Blighted ovum and nonhydatidiform mole: Secondary | ICD-10-CM | POA: Diagnosis not present

## 2022-08-02 DIAGNOSIS — T451X5S Adverse effect of antineoplastic and immunosuppressive drugs, sequela: Secondary | ICD-10-CM | POA: Diagnosis not present

## 2022-08-02 DIAGNOSIS — O019 Hydatidiform mole, unspecified: Secondary | ICD-10-CM

## 2022-08-02 DIAGNOSIS — Z9221 Personal history of antineoplastic chemotherapy: Secondary | ICD-10-CM | POA: Diagnosis not present

## 2022-08-02 DIAGNOSIS — Z8759 Personal history of other complications of pregnancy, childbirth and the puerperium: Secondary | ICD-10-CM | POA: Diagnosis not present

## 2022-08-02 LAB — HIV ANTIBODY (ROUTINE TESTING W REFLEX): HIV Screen 4th Generation wRfx: NONREACTIVE

## 2022-08-03 LAB — BETA HCG QUANT (REF LAB): hCG Quant: <1 m[IU]/mL

## 2022-08-03 LAB — RPR: RPR Ser Ql: NONREACTIVE

## 2022-08-14 ENCOUNTER — Inpatient Hospital Stay: Payer: Medicaid Other | Attending: Psychiatry

## 2022-08-14 ENCOUNTER — Telehealth: Payer: Self-pay

## 2022-08-14 ENCOUNTER — Telehealth: Payer: Self-pay | Admitting: Gynecologic Oncology

## 2022-08-14 DIAGNOSIS — Z8759 Personal history of other complications of pregnancy, childbirth and the puerperium: Secondary | ICD-10-CM | POA: Insufficient documentation

## 2022-08-14 NOTE — Telephone Encounter (Signed)
See below message from Gaylyn Lambert  Message forwarded to a scheduler who will call patient and get her scheduled appropriately Gaylyn Lambert aware

## 2022-08-14 NOTE — Telephone Encounter (Signed)
-----   Message from Kimberly [place], [place] sent at 08/14/2022 10:14 AM EDT ----- Message forwarded to scheduler Leonette Most to call patient and get appointments scheduled ----- Message ----- From: Lorayne Marek, RN Sent: 08/14/2022  10:01 AM EDT To: [facility]  Good morning, This patient missed her monthly lab this morning.  I noticed that her future appts are at 8am and 12pm. Can someone call her and have her scheduled in the afternoon instead as we try to get our treatment patient in the morning?  Anytime after 2 pm would be better, but I do understand if she works.  We can also do her on a Saturday to accommodate her work schedule as well.  Thanks so much! Lorayne Marek, RN

## 2022-08-14 NOTE — Telephone Encounter (Signed)
-----   Message from Lorayne Marek, RN sent at 08/14/2022  9:59 AM EDT ----- Good morning, This patient missed her monthly lab this morning.  I noticed that her future appts are at 8am and 12pm. Can someone call her and have her scheduled in the afternoon instead as we try to get our treatment patient in the morning?  Anytime after 2 pm would be better, but I do understand if she works.  We can also do her on a Saturday to accommodate her work schedule as well.  Thanks so much! Lorayne Marek, RN

## 2022-08-14 NOTE — Telephone Encounter (Signed)
Called patient to rescheduled port flush appointments to afternoon. Left patient a vm to call back to get rescheduled

## 2022-08-15 ENCOUNTER — Encounter: Payer: Self-pay | Admitting: Psychiatry

## 2022-08-16 ENCOUNTER — Telehealth: Payer: Self-pay | Admitting: Hematology and Oncology

## 2022-08-16 NOTE — Telephone Encounter (Signed)
Left patient a vm regarding upcoming appointment change  

## 2022-08-17 ENCOUNTER — Inpatient Hospital Stay: Payer: Medicaid Other

## 2022-08-17 DIAGNOSIS — Z8759 Personal history of other complications of pregnancy, childbirth and the puerperium: Secondary | ICD-10-CM | POA: Diagnosis present

## 2022-08-17 DIAGNOSIS — C58 Malignant neoplasm of placenta: Secondary | ICD-10-CM

## 2022-08-17 DIAGNOSIS — Z95828 Presence of other vascular implants and grafts: Secondary | ICD-10-CM

## 2022-08-17 DIAGNOSIS — C7800 Secondary malignant neoplasm of unspecified lung: Secondary | ICD-10-CM

## 2022-08-17 DIAGNOSIS — O019 Hydatidiform mole, unspecified: Secondary | ICD-10-CM

## 2022-08-17 MED ORDER — HEPARIN SOD (PORK) LOCK FLUSH 100 UNIT/ML IV SOLN
500.0000 [IU] | Freq: Once | INTRAVENOUS | Status: AC | PRN
  Administered 2022-08-17: 500 [IU]

## 2022-08-17 MED ORDER — SODIUM CHLORIDE 0.9% FLUSH
10.0000 mL | INTRAVENOUS | Status: DC | PRN
  Administered 2022-08-17: 10 mL

## 2022-08-18 LAB — BETA HCG QUANT (REF LAB): hCG Quant: <1 m[IU]/mL

## 2022-09-14 ENCOUNTER — Inpatient Hospital Stay: Payer: Medicaid Other | Attending: Psychiatry

## 2022-09-14 ENCOUNTER — Other Ambulatory Visit: Payer: Self-pay

## 2022-09-14 ENCOUNTER — Other Ambulatory Visit: Payer: Medicaid Other

## 2022-09-14 DIAGNOSIS — C58 Malignant neoplasm of placenta: Secondary | ICD-10-CM

## 2022-09-14 DIAGNOSIS — O019 Hydatidiform mole, unspecified: Secondary | ICD-10-CM | POA: Diagnosis not present

## 2022-09-14 DIAGNOSIS — Z95828 Presence of other vascular implants and grafts: Secondary | ICD-10-CM

## 2022-09-14 DIAGNOSIS — C7800 Secondary malignant neoplasm of unspecified lung: Secondary | ICD-10-CM

## 2022-09-14 MED ORDER — SODIUM CHLORIDE 0.9% FLUSH
10.0000 mL | INTRAVENOUS | Status: DC | PRN
  Administered 2022-09-14: 10 mL

## 2022-09-14 MED ORDER — HEPARIN SOD (PORK) LOCK FLUSH 100 UNIT/ML IV SOLN
500.0000 [IU] | Freq: Once | INTRAVENOUS | Status: AC | PRN
  Administered 2022-09-14: 500 [IU]

## 2022-09-16 LAB — BETA HCG QUANT (REF LAB): hCG Quant: <1 m[IU]/mL

## 2022-10-09 ENCOUNTER — Inpatient Hospital Stay: Payer: Medicaid Other | Attending: Psychiatry | Admitting: Psychiatry

## 2022-10-09 ENCOUNTER — Inpatient Hospital Stay: Payer: Medicaid Other

## 2022-10-09 VITALS — BP 116/76 | HR 60 | Temp 98.4°F | Ht 60.63 in | Wt 170.2 lb

## 2022-10-09 DIAGNOSIS — Z793 Long term (current) use of hormonal contraceptives: Secondary | ICD-10-CM | POA: Insufficient documentation

## 2022-10-09 DIAGNOSIS — O019 Hydatidiform mole, unspecified: Secondary | ICD-10-CM | POA: Insufficient documentation

## 2022-10-09 DIAGNOSIS — Z8759 Personal history of other complications of pregnancy, childbirth and the puerperium: Secondary | ICD-10-CM | POA: Insufficient documentation

## 2022-10-09 DIAGNOSIS — O020 Blighted ovum and nonhydatidiform mole: Secondary | ICD-10-CM

## 2022-10-09 DIAGNOSIS — C7800 Secondary malignant neoplasm of unspecified lung: Secondary | ICD-10-CM

## 2022-10-09 DIAGNOSIS — Z95828 Presence of other vascular implants and grafts: Secondary | ICD-10-CM

## 2022-10-09 DIAGNOSIS — Z9221 Personal history of antineoplastic chemotherapy: Secondary | ICD-10-CM | POA: Insufficient documentation

## 2022-10-09 DIAGNOSIS — Z87891 Personal history of nicotine dependence: Secondary | ICD-10-CM | POA: Diagnosis not present

## 2022-10-09 DIAGNOSIS — Z452 Encounter for adjustment and management of vascular access device: Secondary | ICD-10-CM | POA: Diagnosis not present

## 2022-10-09 DIAGNOSIS — C58 Malignant neoplasm of placenta: Secondary | ICD-10-CM

## 2022-10-09 MED ORDER — SODIUM CHLORIDE 0.9% FLUSH
10.0000 mL | INTRAVENOUS | Status: DC | PRN
  Administered 2022-10-09: 10 mL

## 2022-10-09 MED ORDER — HEPARIN SOD (PORK) LOCK FLUSH 100 UNIT/ML IV SOLN
500.0000 [IU] | Freq: Once | INTRAVENOUS | Status: AC | PRN
  Administered 2022-10-09: 500 [IU]

## 2022-10-09 NOTE — Patient Instructions (Signed)
It was a pleasure to see you in clinic today. - Here is the website for [facility]: [email or web address] - Return visit planned for 6mo. - Continue monthly labs.  Thank you very much for allowing me to provide care for you today.  I appreciate your confidence in choosing our Gynecologic Oncology team at [facility].  If you have any questions about your visit today please call our office or send [place] a MyChart message and we will get back to you as soon as possible.

## 2022-10-09 NOTE — Progress Notes (Signed)
Gynecologic Oncology Return Clinic Visit  Date of Service: 10/09/2022 Referring Provider: Edwinna Areola, DO   Assessment & Plan: Lynn Tucker is a 32 y.o. 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  GTN: - Port flushed today. Flush q3 months. Continue with port 12 months. - Patient not currently taking OCPs.  Discussed the importance of reliable contraception while monitoring for GTN.  Patient planning IUD placement with her OB/GYN in the future. Otherwise using condoms for contraception.  - Surveillance with qmonthly hcg x1 year. Visits q3 months. - Discussed [facility] referral for surveillance. Pt considering. Will place referral in [facility] system in case she decides to proceed. AYA website information provided to pt.   Preventative screening: -Pap smear LSIL, HPV HR neg on 07/2022. Repeat in 1 year (07/2023)  RTC 3 months.  [name], MD Gynecologic Oncology   Medical Decision Making I personally spent  TOTAL 20 minutes face-to-face and non-face-to-face in the care of this patient, which includes all pre, intra, and post visit time on the date of service.   ----------------------- Reason for Visit: Surveillance  Treatment History: Oncology History Overview Note  Patient with a remote history of molar pregnancy in 2019 followed by undetectable hCG x6 months. Most recent term pregnancy delivered via C-section in 02/2021. Normal placental pathology. Following delivery, patient was scheduled for IUD placement and was noted to have a positive urine pregnancy test 04/07/2021. Due to persistently positive hCG, she underwent a D&C in May with negative pathology. 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 Heterogeneous appearance of the uterus, nonspecific  likely reflects patient's reported gestational trophoblastic neoplasia. 2. Solid pulmonary nodules in the right lower lobe and lingula are suspicious for metastatic disease.   01/03/2022 Initial Diagnosis   Gestational trophoblastic neoplasia   01/08/2022 Imaging   Brain MRI: IMPRESSION: 1. No evidence of intracranial metastatic disease. 2. Small amount of scattered foci of T2 hyperintensity within the white matter of the cerebral hemispheres, nonspecific, more pronounced than expected for age. Differential diagnosis include demyelinating disease, vasculitis, migraine, post inflammatory/infectious processes or early chronic microangiopathy in the setting of uncontrolled risk factors. 3. Small disc herniation at C3-4 appear to result in at least mild spinal canal stenosis.   01/20/2022 - 03/17/2022 Chemotherapy   EMA-CO x4 cycles (2 cycles past negative beta-hcg)   Malignant neoplasm metastatic to lung (HCC)  01/03/2022 Initial Diagnosis   Malignant neoplasm metastatic to lung [facility])     Interval History: Pt overall doing well. Planning IUD but working with her local Ob/Gyn office on her balance. Mood feeling more like herself off of the OCPs. Mostly abstinence for contraception at this time but has used condoms otherwise. She otherwise denies abdominal bloating, early satiety, significant weight loss, change in bowel or bladder habits. Neuropathy resolved.   Past Medical/Surgical History: Past Medical History:  Diagnosis Date   ADHD (attention deficit hyperactivity disorder)    Chlamydia 11/11/2015   H. pylori infection 04/2014   off antibiotics at this time   Heart murmur    Hyperlipidemia    Impaired fasting glucose    2013   Molar pregnancy 04/25/2017   Obesity    Pre-diabetes    Short cervix during pregnancy in second trimester 09/21/2018   Sickle cell trait (HCC)    SVD (spontaneous vaginal delivery) 01/31/2019  Ventricular septal defect (VSD)    VSD (ventricular  septal defect)    followed by cardiology every 2 years   Wears glasses     Past Surgical History:  Procedure Laterality Date   CERVICAL CERCLAGE N/A 09/22/2018   Procedure: CERCLAGE CERVICAL;  Surgeon: Sherian Rein, MD;  [facility]: [facility];  Service: Gynecology;  Laterality: N/A;   CESAREAN SECTION N/A 02/26/2021   Procedure: CESAREAN SECTION;  Surgeon: Charlett Nose, MD;  Location: [facility] LD ORS;  Service: Obstetrics;  Laterality: N/A;  Primary C/S for Fetal Intolerance to Labor   DILATION AND EVACUATION N/A 08/05/2015   Procedure: DILATATION AND EVACUATION;  Surgeon: Zelphia Cairo, MD;  Location: [facility] ORS;  Service: Gynecology;  Laterality: N/A;   DILATION AND EVACUATION N/A 04/25/2017   Procedure: DILATATION AND EVACUATION;  Surgeon: Sherian Rein, MD;  Location: [facility] ORS;  Service: Gynecology;  Laterality: N/A;   HYSTEROSCOPY WITH D & C N/A 08/12/2021   Procedure: DILATATION AND CURETTAGE /HYSTEROSCOPY;  Surgeon: Edwinna Areola, DO;  Location: [facility];  Service: Gynecology;  Laterality: N/A;   WISDOM TOOTH EXTRACTION      Family History  Problem Relation Age of Onset   Hypertension Mother    Sickle cell trait Father    Hypertension Father    Thyroid disease Paternal Aunt    Cancer Other        maternal side-? type   Hyperlipidemia Other        ? side   Stroke Other        paternal side   Diabetes Other        paternal and maternal side   Breast cancer Neg Hx    Ovarian cancer Neg Hx    Colon cancer Neg Hx    Uterine cancer Neg Hx     Social History   Socioeconomic History   Marital status: Single    Spouse name: Not on file   Number of children: Not on file   Years of education: Not on file   Highest education level: Not on file  Occupational History   Not on file  Tobacco Use   Smoking status: Former    Types: Cigarettes   Smokeless tobacco: Never  Vaping Use   Vaping Use: Never used  Substance and Sexual Activity    Alcohol use: No    Alcohol/week: 1.0 standard drink of alcohol    Types: 1 Standard drinks or equivalent per week    Comment: random   Drug use: No    Comment: THC in past 30 days per pt on 12/26/21   Sexual activity: Yes    Partners: Male    Birth control/protection: None  Other Topics Concern   Not on file  Social History Narrative   Working at pharmacy, 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   06/2020.   Social Determinants of Health   Financial Resource Strain: Low Risk  (09/20/2018)   Overall Financial Resource Strain (CARDIA)    Difficulty of Paying Living Expenses: Not hard at all  Food Insecurity: No Food Insecurity (09/20/2018)   Hunger Vital Sign    Worried About Running Out of Food in the Last Year: Never true    Ran Out of Food in the Last Year: Never true  Transportation Needs: Unknown (09/20/2018)   PRAPARE - Administrator, Civil Service (Medical): No    Lack of Transportation (Non-Medical): Not on file  Physical Activity: Not  on file  Stress: Stress Concern Present (09/20/2018)   [employer] of Occupational Health - Occupational Stress Questionnaire    Feeling of Stress : To some extent  Social Connections: Not on file    Current Medications:  Current Outpatient Medications:    gabapentin (NEURONTIN) 300 MG capsule, Take by mouth., Disp: , Rfl:    loratadine (CLARITIN) 10 MG tablet, Take 10 mg by mouth daily as needed for allergies. (Patient not taking: Reported on 07/20/2022), Disp: , Rfl:    norethindrone-ethinyl estradiol-FE (LOESTRIN FE) 1-20 MG-MCG tablet, Take 1 tablet by mouth daily., Disp: , Rfl:   Review of Symptoms: Complete 10-system review is positive for: none  Physical Exam: BP 116/76 (BP Location: Left Arm, Patient Position: Sitting)   Pulse 60   Temp 98.4 F (36.9 C) (Oral)   Ht 5' 0.63" (1.54 m)   Wt 170 lb 3.2 oz (77.2 kg)   BMI 32.55 kg/m  General: Alert, oriented, no acute distress. HEENT: Normocephalic,  atraumatic. Neck symmetric without masses. Sclera anicteric.  Chest: Normal work of breathing. Clear to auscultation bilaterally.  Port site clean. Cardiovascular: Regular rate and rhythm, no murmurs. Abdomen: Soft, nontender.  Normoactive bowel sounds.  No masses or hepatosplenomegaly appreciated.   Extremities: Grossly normal range of motion.  Warm, well perfused.  No edema bilaterally. Skin: Hyperpigmented circular spot on left lower abdomen, possibly at site of prior heparin injection. Lymphatics: No cervical, supraclavicular, or inguinal adenopathy. GU: Normal appearing external genitalia without erythema, excoriation, or lesions.  Speculum exam reveals normal vagina and cervix. Bimanual exam reveals  normal cevix and uterus; no adnexal masses.  Exam chaperoned by Andrey Cota, RN   Laboratory & Radiologic Studies:   Lab Results  Component Value Date/Time   HCGQUANT <1 09/14/2022 02:18 PM

## 2022-10-10 LAB — BETA HCG QUANT (REF LAB): hCG Quant: <1 m[IU]/mL

## 2022-10-13 ENCOUNTER — Other Ambulatory Visit: Payer: Medicaid Other

## 2022-10-15 ENCOUNTER — Encounter: Payer: Self-pay | Admitting: Psychiatry

## 2022-10-15 ENCOUNTER — Encounter: Payer: Self-pay | Admitting: Gynecologic Oncology

## 2022-10-15 DIAGNOSIS — O019 Hydatidiform mole, unspecified: Principal | ICD-10-CM

## 2022-11-08 DIAGNOSIS — R519 Headache, unspecified: Secondary | ICD-10-CM | POA: Diagnosis not present

## 2022-11-08 DIAGNOSIS — Z20822 Contact with and (suspected) exposure to covid-19: Secondary | ICD-10-CM | POA: Diagnosis not present

## 2022-11-08 DIAGNOSIS — J3489 Other specified disorders of nose and nasal sinuses: Secondary | ICD-10-CM | POA: Diagnosis not present

## 2022-11-08 DIAGNOSIS — U071 COVID-19: Secondary | ICD-10-CM | POA: Diagnosis not present

## 2022-11-14 ENCOUNTER — Inpatient Hospital Stay: Payer: Medicaid Other | Attending: Psychiatry

## 2022-11-14 ENCOUNTER — Other Ambulatory Visit: Payer: Self-pay

## 2022-11-14 DIAGNOSIS — Z8759 Personal history of other complications of pregnancy, childbirth and the puerperium: Secondary | ICD-10-CM | POA: Insufficient documentation

## 2022-11-14 DIAGNOSIS — C7800 Secondary malignant neoplasm of unspecified lung: Secondary | ICD-10-CM

## 2022-11-14 DIAGNOSIS — Z95828 Presence of other vascular implants and grafts: Secondary | ICD-10-CM

## 2022-11-14 DIAGNOSIS — O019 Hydatidiform mole, unspecified: Secondary | ICD-10-CM

## 2022-11-14 DIAGNOSIS — C58 Malignant neoplasm of placenta: Secondary | ICD-10-CM

## 2022-11-14 MED ORDER — HEPARIN SOD (PORK) LOCK FLUSH 100 UNIT/ML IV SOLN
500.0000 [IU] | Freq: Once | INTRAVENOUS | Status: AC | PRN
  Administered 2022-11-14: 500 [IU]

## 2022-11-14 MED ORDER — SODIUM CHLORIDE 0.9% FLUSH
10.0000 mL | INTRAVENOUS | Status: DC | PRN
  Administered 2022-11-14: 10 mL

## 2022-12-15 ENCOUNTER — Inpatient Hospital Stay: Payer: Medicaid Other | Attending: Psychiatry

## 2022-12-15 DIAGNOSIS — O019 Hydatidiform mole, unspecified: Secondary | ICD-10-CM | POA: Insufficient documentation

## 2022-12-15 DIAGNOSIS — Z95828 Presence of other vascular implants and grafts: Secondary | ICD-10-CM

## 2022-12-15 DIAGNOSIS — C7800 Secondary malignant neoplasm of unspecified lung: Secondary | ICD-10-CM

## 2022-12-15 DIAGNOSIS — C58 Malignant neoplasm of placenta: Secondary | ICD-10-CM

## 2022-12-15 MED ORDER — SODIUM CHLORIDE 0.9% FLUSH
10.0000 mL | INTRAVENOUS | Status: DC | PRN
  Administered 2022-12-15: 10 mL

## 2022-12-15 MED ORDER — HEPARIN SOD (PORK) LOCK FLUSH 100 UNIT/ML IV SOLN
500.0000 [IU] | Freq: Once | INTRAVENOUS | Status: AC | PRN
  Administered 2022-12-15: 500 [IU]

## 2022-12-16 LAB — BETA HCG QUANT (REF LAB): hCG Quant: <1 m[IU]/mL

## 2023-01-08 ENCOUNTER — Telehealth: Payer: Self-pay | Admitting: *Deleted

## 2023-01-08 NOTE — Patient Outreach (Signed)
Care Management   Note  01/08/2023 Name: MELINE SWIDER MRN: [id number] DOB: 27-Mar-1991  Briseida Conni Elliot is enrolled in a Managed Medicaid plan: Yes. Outreach attempt today was unsuccessful.   A HIPPA compliant phone message was left for the patient providing contact information and requesting a return call.   Estanislado Emms RN, BSN [place]  [facility] RN Care Coordinator [phone]

## 2023-01-09 ENCOUNTER — Telehealth: Payer: Self-pay | Admitting: *Deleted

## 2023-01-09 NOTE — Patient Outreach (Signed)
Medicaid Managed Care   Unsuccessful Outreach Note  01/09/2023 Name: Lynn Tucker MRN: [id number] DOB: 06/14/1990  Referred by: Pcp, No Reason for referral : High Risk Managed Medicaid (Unsuccessful RNCM telephone outreach)   A second unsuccessful telephone outreach was attempted today. The patient was referred to the case management team for assistance with care management and care coordination.   Follow Up Plan: A HIPAA compliant phone message was left for the patient providing contact information and requesting a return call.  The care management team will reach out to the patient again over the next 7 days.   Estanislado Emms RN, BSN [place]  [facility] RN Care Coordinator [phone]

## 2023-01-10 ENCOUNTER — Telehealth: Payer: Self-pay | Admitting: *Deleted

## 2023-01-10 NOTE — Patient Outreach (Signed)
Medicaid Managed Care   Unsuccessful Outreach Note  01/10/2023 Name: Lynn Tucker MRN: [id number] DOB: Jun 21, 1990  Referred by: Pcp, No Reason for referral : High Risk Managed Medicaid (Unsuccessful RNCM telephone outreach)   Third unsuccessful telephone outreach was attempted today. The patient was referred to the case management team for assistance with care management and care coordination. The patient's primary care provider has been notified of our unsuccessful attempts to make or maintain contact with the patient. The care management team is pleased to engage with this patient at any time in the future should he/she be interested in assistance from the care management team.   Follow Up Plan: A HIPAA compliant phone message was left for the patient providing contact information and requesting a return call.   Estanislado Emms RN, BSN [place]  [facility] RN Care Coordinator [phone]

## 2023-01-15 ENCOUNTER — Inpatient Hospital Stay: Payer: Commercial Managed Care - PPO | Attending: Psychiatry

## 2023-01-15 ENCOUNTER — Inpatient Hospital Stay (HOSPITAL_BASED_OUTPATIENT_CLINIC_OR_DEPARTMENT_OTHER): Payer: Commercial Managed Care - PPO | Admitting: Psychiatry

## 2023-01-15 ENCOUNTER — Encounter: Payer: Self-pay | Admitting: Psychiatry

## 2023-01-15 VITALS — BP 117/70 | HR 63 | Temp 98.5°F | Resp 20 | Ht 60.0 in | Wt 169.2 lb

## 2023-01-15 DIAGNOSIS — Z3A00 Weeks of gestation of pregnancy not specified: Secondary | ICD-10-CM | POA: Diagnosis not present

## 2023-01-15 DIAGNOSIS — Z95828 Presence of other vascular implants and grafts: Secondary | ICD-10-CM

## 2023-01-15 DIAGNOSIS — C58 Malignant neoplasm of placenta: Secondary | ICD-10-CM

## 2023-01-15 DIAGNOSIS — O019 Hydatidiform mole, unspecified: Secondary | ICD-10-CM | POA: Insufficient documentation

## 2023-01-15 DIAGNOSIS — C7800 Secondary malignant neoplasm of unspecified lung: Secondary | ICD-10-CM

## 2023-01-15 MED ORDER — SODIUM CHLORIDE 0.9% FLUSH
10.0000 mL | INTRAVENOUS | Status: DC | PRN
  Administered 2023-01-15: 10 mL

## 2023-01-15 MED ORDER — HEPARIN SOD (PORK) LOCK FLUSH 100 UNIT/ML IV SOLN
500.0000 [IU] | Freq: Once | INTRAVENOUS | Status: AC | PRN
  Administered 2023-01-15: 500 [IU]

## 2023-01-15 NOTE — Progress Notes (Signed)
Gynecologic Oncology Return Clinic Visit  Date of Service: 01/15/2023 Referring Provider: Edwinna Areola, DO   Assessment & Plan: Lynn Tucker is a 32 y.o. 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  GTN: - Port flushed today. Flush q3 months. Continue with port 12 months. - Patient not currently taking OCPs.  Discussed the importance of reliable contraception while monitoring for GTN. Abstinence currently. - Surveillance with qmonthly hcg x1 year. Visits q3 months. - Previously offered [facility] AYA referral for surveillance.   Preventative screening: -Pap smear LSIL, HPV HR neg on 07/2022. Repeat in 1 year (07/2023)  RTC 3 months.  [name], MD Gynecologic Oncology   Medical Decision Making I personally spent  TOTAL 20 minutes face-to-face and non-face-to-face in the care of this patient, which includes all pre, intra, and post visit time on the date of service.   ----------------------- Reason for Visit: Surveillance  Treatment History: Oncology History Overview Note  Patient with a remote history of molar pregnancy in 2019 followed by undetectable hCG x6 months. Most recent term pregnancy delivered via C-section in 02/2021. Normal placental pathology. Following delivery, patient was scheduled for IUD placement and was noted to have a positive urine pregnancy test 04/07/2021. Due to persistently positive hCG, she underwent a D&C in May with negative pathology. 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 Heterogeneous appearance of the uterus, nonspecific likely reflects patient's reported gestational trophoblastic neoplasia. 2. Solid pulmonary nodules in the right lower lobe and lingula are suspicious for metastatic disease.   01/03/2022 Initial  Diagnosis   Gestational trophoblastic neoplasia   01/08/2022 Imaging   Brain MRI: IMPRESSION: 1. No evidence of intracranial metastatic disease. 2. Small amount of scattered foci of T2 hyperintensity within the white matter of the cerebral hemispheres, nonspecific, more pronounced than expected for age. Differential diagnosis include demyelinating disease, vasculitis, migraine, post inflammatory/infectious processes or early chronic microangiopathy in the setting of uncontrolled risk factors. 3. Small disc herniation at C3-4 appear to result in at least mild spinal canal stenosis.   01/20/2022 - 03/17/2022 Chemotherapy   EMA-CO x4 cycles (2 cycles past negative beta-hcg)   Malignant neoplasm metastatic to lung (HCC)  01/03/2022 Initial Diagnosis   Malignant neoplasm metastatic to lung [facility])     Interval History: Patient reports that she is getting over cold and cough but improving.  Overall feeling better lately.  Abstinence has been her form of contraception. She otherwise denies abdominal bloating, early satiety, significant weight loss, change in bowel or bladder habits. Neuropathy resolved.   Past Medical/Surgical History: Past Medical History:  Diagnosis Date   ADHD (attention deficit hyperactivity disorder)    Chlamydia 11/11/2015   H. pylori infection 04/2014   off antibiotics at this time   Heart murmur    Hyperlipidemia    Impaired fasting glucose    2013   Molar pregnancy 04/25/2017   Obesity    Pre-diabetes    Short cervix during pregnancy in second trimester 09/21/2018   Sickle cell trait (HCC)    SVD (spontaneous vaginal delivery) 01/31/2019   Ventricular septal defect (VSD)    VSD (ventricular septal defect)    followed by cardiology every 2 years   Wears glasses     Past Surgical History:  Procedure Laterality Date   CERVICAL CERCLAGE N/A 09/22/2018   Procedure:  CERCLAGE CERVICAL;  Surgeon: Sherian Rein, MD;  Location: [facility] LD [facility]S;  Service:  Gynecology;  Laterality: N/A;   CESAREAN SECTION N/A 02/26/2021   Procedure: CESAREAN SECTION;  Surgeon: Charlett Nose, MD;  Location: [facility] LD [facility]S;  Service: Obstetrics;  Laterality: N/A;  Primary C/S for Fetal Intolerance to Labor   DILATION AND EVACUATION N/A 08/05/2015   Procedure: DILATATION AND EVACUATION;  Surgeon: Zelphia Cairo, MD;  Location: [facility]S;  Service: Gynecology;  Laterality: N/A;   DILATION AND EVACUATION N/A 04/25/2017   Procedure: DILATATION AND EVACUATION;  Surgeon: Sherian Rein, MD;  Location: [facility];  Service: Gynecology;  Laterality: N/A;   HYSTEROSCOPY WITH D & C N/A 08/12/2021   Procedure: DILATATION AND CURETTAGE /HYSTEROSCOPY;  Surgeon: Edwinna Areola, DO;  Location: [facility];  Service: Gynecology;  Laterality: N/A;   WISDOM TOOTH EXTRACTION      Family History  Problem Relation Age of Onset   Hypertension Mother    Sickle cell trait Father    Hypertension Father    Thyroid disease Paternal Aunt    Cancer Other        maternal side-? type   Hyperlipidemia Other        ? side   Stroke Other        paternal side   Diabetes Other        paternal and maternal side   Breast cancer Neg Hx    Ovarian cancer Neg Hx    Colon cancer Neg Hx    Uterine cancer Neg Hx     Social History   Socioeconomic History   Marital status: Single    Spouse name: Not on file   Number of children: Not on file   Years of education: Not on file   Highest education level: Not on file  Occupational History   Not on file  Tobacco Use   Smoking status: Former    Types: Cigarettes   Smokeless tobacco: Never  Vaping Use   Vaping status: Never Used  Substance and Sexual Activity   Alcohol use: No    Alcohol/week: 1.0 standard drink of alcohol    Types: 1 Standard drinks or equivalent per week    Comment: random   Drug use: No    Comment: THC in past 30 days per pt on 12/26/21   Sexual activity: Yes    Partners: Male    Birth  control/protection: None  Other Topics Concern   Not on file  Social History Narrative   Working at pharmacy, 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   06/2020.   Social Determinants of Health   Financial Resource Strain: Low Risk  (09/20/2018)   Overall Financial Resource Strain (CARDIA)    Difficulty of Paying Living Expenses: Not hard at all  Food Insecurity: No Food Insecurity (09/20/2018)   Hunger Vital Sign    Worried About Running Out of Food in the Last Year: Never true    Ran Out of Food in the Last Year: Never true  Transportation Needs: Unknown (09/20/2018)   PRAPARE - Administrator, Civil Service (Medical): No    Lack of Transportation (Non-Medical): Not on file  Physical Activity: Not on file  Stress: Stress Concern Present (09/20/2018)   [employer] of Occupational Health - Occupational Stress Questionnaire    Feeling of Stress : To some extent  Social Connections: Not on file    Current Medications:  Current Outpatient Medications:  loratadine (CLARITIN) 10 MG tablet, Take 10 mg by mouth daily as needed for allergies., Disp: , Rfl:    norethindrone-ethinyl estradiol-FE (LOESTRIN FE) 1-20 MG-MCG tablet, Take 1 tablet by mouth daily. (Patient not taking: Reported on 01/11/2023), Disp: , Rfl:  No current facility-administered medications for this visit.  Facility-Administered Medications Ordered in Other Visits:    sodium chloride flush (NS) 0.9 % injection 10 mL, 10 mL, Intracatheter, PRN, Cross, Melissa D, NP, 10 mL at 01/15/23 1326  Review of Symptoms: Complete 10-system review is positive for: none  Physical Exam: BP 117/70   Pulse 63   Temp 98.5 F (36.9 C)   Resp 20   Ht 5' (1.524 m)   Wt 169 lb 3.2 oz (76.7 kg)   SpO2 100%   BMI 33.04 kg/m  General: Alert, oriented, no acute distress. HEENT: Normocephalic, atraumatic. Neck symmetric without masses. Sclera anicteric.  Chest: Normal work of breathing. Clear to auscultation  bilaterally.  Port site clean. Cardiovascular: Regular rate and rhythm, no murmurs. Abdomen: Soft, nontender.  Normoactive bowel sounds.  No masses or hepatosplenomegaly appreciated.   Extremities: Grossly normal range of motion.  Warm, well perfused.  No edema bilaterally. Skin: Hyperpigmented circular spot on left lower abdomen, possibly at site of prior heparin injection. Lymphatics: No cervical, supraclavicular, or inguinal adenopathy. GU: Normal appearing external genitalia without erythema, excoriation, or lesions.  Speculum exam reveals normal vagina and cervix; menstrual blood in vault. Bimanual exam reveals  normal cevix and uterus; no adnexal masses.  Exam chaperoned by Kimberly [place], CMA   Laboratory & Radiologic Studies:   Lab Results  Component Value Date/Time   HCGQUANT <1 12/15/2022 08:41 AM

## 2023-01-15 NOTE — Patient Instructions (Signed)
It was a pleasure to see you in clinic today. - Normal exam today. I will follow-up the lab. - Return visit planned for 3 months.  Thank you very much for allowing me to provide care for you today.  I appreciate your confidence in choosing our Gynecologic Oncology team at [facility].  If you have any questions about your visit today please call our office or send [place] a MyChart message and we will get back to you as soon as possible.

## 2023-01-16 LAB — BETA HCG QUANT (REF LAB): hCG Quant: <1 m[IU]/mL

## 2023-02-07 ENCOUNTER — Ambulatory Visit
Admission: EM | Admit: 2023-02-07 | Discharge: 2023-02-07 | Disposition: A | Discharge status: Home / Self Care | Payer: Commercial Managed Care - PPO | Attending: Family Medicine | Admitting: Family Medicine

## 2023-02-07 ENCOUNTER — Encounter: Payer: Self-pay | Admitting: Gynecologic Oncology

## 2023-02-07 ENCOUNTER — Other Ambulatory Visit: Payer: Self-pay

## 2023-02-07 DIAGNOSIS — Z9189 Other specified personal risk factors, not elsewhere classified: Secondary | ICD-10-CM | POA: Insufficient documentation

## 2023-02-07 DIAGNOSIS — L739 Follicular disorder, unspecified: Secondary | ICD-10-CM | POA: Diagnosis not present

## 2023-02-07 NOTE — Discharge Instructions (Addendum)
Your test results will be available in MyChart

## 2023-02-07 NOTE — ED Triage Notes (Signed)
Wants STI testing with bloodwork. Has bump to genitals wants evaluated also.

## 2023-02-07 NOTE — ED Provider Notes (Signed)
[facility]    CSN: [id number] Arrival date & time: 02/07/23  1243      History   Chief Complaint Chief Complaint  Patient presents with   SEXUALLY TRANSMITTED DISEASE    HPI Lynn Tucker is a 32 y.o. female.   Patient is here requesting STD like vaginal swab and laboratory testing.  She also has a bump on the skin near her external genitalia she would like looked at..  I questioned unprotected sexual relations given her diagnosis of gestational trophoblastic neoplasm.  Patient indicates that the person she is sexually active with "does not require pregnancy protection"    Past Medical History:  Diagnosis Date   ADHD (attention deficit hyperactivity disorder)    Chlamydia 11/11/2015   H. 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   Malignant neoplasm metastatic to lung (HCC) 01/03/2022   [redacted] weeks gestation of pregnancy 02/25/2021   Screening for deficiency anemia 06/10/2020   Screening for thyroid disorder 06/10/2020   Dyspnea 06/10/2020   Vaccine refused by patient 06/10/2020   Abnormal PFT 06/10/2020   Vaccine counseling 05/05/2020   Hidradenitis 05/05/2020   Cervical cerclage suture present 09/22/2018   Short cervix during pregnancy in second trimester 09/21/2018   Prediabetes 04/08/2018   Impaired fasting glucose 04/08/2018   Molar pregnancy 04/25/2017   Encounter for health maintenance examination in  adult 03/13/2016   Influenza vaccination declined 03/13/2016   VSD (ventricular septal defect) 03/13/2016   Heart murmur 03/13/2016   Acanthosis 03/13/2016   Hyperlipidemia 02/08/2012   Obesity (BMI 30-39.9) 03/15/2011    Past Surgical History:  Procedure Laterality Date   CERVICAL CERCLAGE N/A 09/22/2018   Procedure: CERCLAGE CERVICAL;  Surgeon: Sherian Rein, MD;  Location: [facility];  Service: Gynecology;  Laterality: N/A;   CESAREAN SECTION N/A 02/26/2021   Procedure: CESAREAN SECTION;  Surgeon: Charlett Nose, MD;  Location: [facility];  Service: Obstetrics;  Laterality: N/A;  Primary C/S for Fetal Intolerance to Labor   DILATION AND EVACUATION N/A 08/05/2015   Procedure: DILATATION AND EVACUATION;  Surgeon: Zelphia Cairo, MD;  Location: [facility];  Service: Gynecology;  Laterality: N/A;   DILATION AND EVACUATION N/A 04/25/2017   Procedure: DILATATION AND EVACUATION;  Surgeon: Sherian Rein, MD;  Location: [facility];  Service: Gynecology;  Laterality: N/A;   HYSTEROSCOPY WITH D & C N/A 08/12/2021   Procedure: DILATATION AND CURETTAGE /HYSTEROSCOPY;  Surgeon: Edwinna Areola, DO;  Location: [facility];  Service: Gynecology;  Laterality: N/A;   WISDOM TOOTH EXTRACTION      OB History     Gravida  4   Para  2   Term  2   Preterm  0   AB  2   Living  2      SAB  2   IAB  0  Ectopic  0   Multiple  0   Live Births  2        Obstetric Comments  D&E for incomplete abortion          Home Medications    Prior to Admission medications   Not on File    Family History Family History  Problem Relation Age of Onset   Hypertension Mother    Sickle cell trait Father    Hypertension Father    Thyroid disease Paternal Aunt    Cancer Other        maternal side-? type   Hyperlipidemia Other        ? side   Stroke Other        paternal side   Diabetes Other        paternal and maternal side   Breast cancer Neg Hx     Ovarian cancer Neg Hx    Colon cancer Neg Hx    Uterine cancer Neg Hx     Social History Social History   Tobacco Use   Smoking status: Former    Types: Cigarettes   Smokeless tobacco: Never  Vaping Use   Vaping status: Never Used  Substance Use Topics   Alcohol use: No    Alcohol/week: 1.0 standard drink of alcohol    Types: 1 Standard drinks or equivalent per week    Comment: random   Drug use: No    Comment: THC in past 30 days per pt on 12/26/21     Allergies   Metformin and related and Phentermine   Review of Systems Review of Systems See HPI  Physical Exam Triage Vital Signs ED Triage Vitals  Encounter Vitals Group     BP 02/07/23 1252 109/73     Systolic BP Percentile --      Diastolic BP Percentile --      Pulse Rate 02/07/23 1252 67     Resp 02/07/23 1252 16     Temp 02/07/23 1252 97.6 F (36.4 C)     Temp Source 02/07/23 1252 Oral     SpO2 02/07/23 1252 99 %     Weight --      Height --      Head Circumference --      Peak Flow --      Pain Score 02/07/23 1255 0     Pain Loc --      Pain Education --      Exclude from Growth Chart --    No data found.  Updated Vital Signs BP 109/73   Pulse 67   Temp 97.6 F (36.4 C) (Oral)   Resp 16   SpO2 99%      Physical Exam Constitutional:      General: She is not in acute distress.    Appearance: She is well-developed.  HENT:     Head: Normocephalic and atraumatic.  Eyes:     Conjunctiva/sclera: Conjunctivae normal.     Pupils: Pupils are equal, round, and reactive to light.  Cardiovascular:     Rate and Rhythm: Normal rate.  Pulmonary:     Effort: Pulmonary effort is normal. No respiratory distress.  Abdominal:     General: There is no distension.     Palpations: Abdomen is soft.  Genitourinary:   Musculoskeletal:        General: Normal range of motion.     Cervical back: Normal range of motion.  Skin:    General: Skin is  warm and dry.  Neurological:     Mental Status: She is  alert.      UC Treatments / Results  Labs (all labs ordered are listed, but only abnormal results are displayed) Labs Reviewed  HIV ANTIBODY (ROUTINE TESTING W REFLEX)  RPR  CERVICOVAGINAL ANCILLARY ONLY    EKG   Radiology No results found.  Procedures Procedures (including critical care time)  Medications Ordered in UC Medications - No data to display  Initial Impression / Assessment and Plan / UC Course  I have reviewed the triage vital signs and the nursing notes.  Pertinent labs & imaging results that were available during my care of the patient were reviewed by me and considered in my medical decision making (see chart for details).      Final Clinical Impressions(s) / UC Diagnoses   Final diagnoses:  At risk for sexually transmitted infection due to unprotected sex  Folliculitis     Discharge Instructions      Your test results will be available in MyChart     ED Prescriptions   None    PDMP not reviewed this encounter.   Eustace Moore, MD 02/07/23 [phone]

## 2023-02-07 NOTE — ED Notes (Signed)
Unsuccessful venipuncture attempt, sending patient to [facility] for bloodwork

## 2023-02-08 ENCOUNTER — Telehealth: Payer: Self-pay

## 2023-02-08 LAB — CERVICOVAGINAL ANCILLARY ONLY
Bacterial Vaginitis (gardnerella): POSITIVE — AB
Chlamydia: NEGATIVE
Comment: NEGATIVE
Comment: NEGATIVE
Comment: NEGATIVE
Comment: NORMAL
Neisseria Gonorrhea: NEGATIVE
Trichomonas: NEGATIVE

## 2023-02-08 LAB — RPR: RPR Ser Ql: NONREACTIVE

## 2023-02-08 LAB — HIV ANTIBODY (ROUTINE TESTING W REFLEX): HIV Screen 4th Generation wRfx: NONREACTIVE

## 2023-02-08 MED ORDER — METRONIDAZOLE 500 MG PO TABS
500.0000 mg | ORAL_TABLET | Freq: Two times a day (BID) | ORAL | 0 refills | Status: AC
Start: 2023-02-08 — End: 2023-02-15

## 2023-02-08 NOTE — Telephone Encounter (Signed)
 Per protocol, pt requires tx with metronidazole. Attempted to reach patient x1. LVM. Rx sent to pharmacy on file.

## 2023-02-14 ENCOUNTER — Inpatient Hospital Stay: Payer: Commercial Managed Care - PPO | Attending: Psychiatry

## 2023-02-14 ENCOUNTER — Other Ambulatory Visit: Payer: Self-pay

## 2023-02-14 DIAGNOSIS — O019 Hydatidiform mole, unspecified: Secondary | ICD-10-CM | POA: Diagnosis present

## 2023-02-14 DIAGNOSIS — Z95828 Presence of other vascular implants and grafts: Secondary | ICD-10-CM

## 2023-02-14 DIAGNOSIS — C58 Malignant neoplasm of placenta: Secondary | ICD-10-CM

## 2023-02-14 DIAGNOSIS — C7800 Secondary malignant neoplasm of unspecified lung: Secondary | ICD-10-CM

## 2023-02-14 MED ORDER — SODIUM CHLORIDE 0.9% FLUSH
10.0000 mL | INTRAVENOUS | Status: DC | PRN
  Administered 2023-02-14: 10 mL

## 2023-02-14 MED ORDER — HEPARIN SOD (PORK) LOCK FLUSH 100 UNIT/ML IV SOLN
500.0000 [IU] | Freq: Once | INTRAVENOUS | Status: AC | PRN
Start: 2023-02-14 — End: 2023-02-14
  Administered 2023-02-14: 500 [IU]

## 2023-02-15 LAB — BETA HCG QUANT (REF LAB): hCG Quant: <1 m[IU]/mL

## 2023-03-14 ENCOUNTER — Other Ambulatory Visit: Payer: Self-pay | Admitting: Gynecologic Oncology

## 2023-03-14 DIAGNOSIS — O019 Hydatidiform mole, unspecified: Secondary | ICD-10-CM

## 2023-03-16 ENCOUNTER — Inpatient Hospital Stay: Payer: Commercial Managed Care - PPO | Attending: Psychiatry

## 2023-03-16 DIAGNOSIS — O019 Hydatidiform mole, unspecified: Secondary | ICD-10-CM | POA: Diagnosis not present

## 2023-03-16 DIAGNOSIS — C7800 Secondary malignant neoplasm of unspecified lung: Secondary | ICD-10-CM

## 2023-03-16 DIAGNOSIS — C58 Malignant neoplasm of placenta: Secondary | ICD-10-CM

## 2023-03-16 DIAGNOSIS — Z95828 Presence of other vascular implants and grafts: Secondary | ICD-10-CM

## 2023-03-16 LAB — CBC WITH DIFFERENTIAL (CANCER CENTER ONLY)
Abs Immature Granulocytes: 0.01 10*3/uL (ref 0.00–0.07)
Basophils Absolute: 0 10*3/uL (ref 0.0–0.1)
Basophils Relative: 1 %
Eosinophils Absolute: 0.1 10*3/uL (ref 0.0–0.5)
Eosinophils Relative: 3 %
HCT: 33.6 % — ABNORMAL LOW (ref 36.0–46.0)
Hemoglobin: 10.7 g/dL — ABNORMAL LOW (ref 12.0–15.0)
Immature Granulocytes: 0 %
Lymphocytes Relative: 26 %
Lymphs Abs: 1.3 10*3/uL (ref 0.7–4.0)
MCH: 24.9 pg — ABNORMAL LOW (ref 26.0–34.0)
MCHC: 31.8 g/dL (ref 30.0–36.0)
MCV: 78.1 fL — ABNORMAL LOW (ref 80.0–100.0)
Monocytes Absolute: 0.5 10*3/uL (ref 0.1–1.0)
Monocytes Relative: 10 %
Neutro Abs: 3 10*3/uL (ref 1.7–7.7)
Neutrophils Relative %: 60 %
Platelet Count: 355 10*3/uL (ref 150–400)
RBC: 4.3 MIL/uL (ref 3.87–5.11)
RDW: 15 % (ref 11.5–15.5)
WBC Count: 5 10*3/uL (ref 4.0–10.5)
nRBC: 0 % (ref 0.0–0.2)

## 2023-03-16 MED ORDER — SODIUM CHLORIDE 0.9% FLUSH
10.0000 mL | INTRAVENOUS | Status: DC | PRN
  Administered 2023-03-16: 10 mL

## 2023-03-16 MED ORDER — HEPARIN SOD (PORK) LOCK FLUSH 100 UNIT/ML IV SOLN
500.0000 [IU] | Freq: Once | INTRAVENOUS | Status: AC | PRN
Start: 2023-03-16 — End: 2023-03-16
  Administered 2023-03-16: 500 [IU]

## 2023-03-18 LAB — BETA HCG QUANT (REF LAB): hCG Quant: <1 m[IU]/mL

## 2023-04-16 ENCOUNTER — Encounter: Payer: Self-pay | Admitting: Gynecologic Oncology

## 2023-04-16 ENCOUNTER — Inpatient Hospital Stay: Payer: Commercial Managed Care - PPO

## 2023-04-16 ENCOUNTER — Encounter: Payer: Self-pay | Admitting: Psychiatry

## 2023-04-16 ENCOUNTER — Inpatient Hospital Stay: Payer: Commercial Managed Care - PPO | Attending: Psychiatry | Admitting: Psychiatry

## 2023-04-16 VITALS — BP 127/68 | HR 70 | Temp 98.1°F | Resp 18 | Wt 169.8 lb

## 2023-04-16 DIAGNOSIS — C58 Malignant neoplasm of placenta: Secondary | ICD-10-CM

## 2023-04-16 DIAGNOSIS — C7800 Secondary malignant neoplasm of unspecified lung: Secondary | ICD-10-CM

## 2023-04-16 DIAGNOSIS — Z3202 Encounter for pregnancy test, result negative: Secondary | ICD-10-CM | POA: Diagnosis not present

## 2023-04-16 DIAGNOSIS — Z8759 Personal history of other complications of pregnancy, childbirth and the puerperium: Secondary | ICD-10-CM | POA: Diagnosis not present

## 2023-04-16 DIAGNOSIS — O019 Hydatidiform mole, unspecified: Secondary | ICD-10-CM

## 2023-04-16 DIAGNOSIS — Z95828 Presence of other vascular implants and grafts: Secondary | ICD-10-CM

## 2023-04-16 MED ORDER — SODIUM CHLORIDE 0.9% FLUSH
10.0000 mL | INTRAVENOUS | Status: DC | PRN
  Administered 2023-04-16: 10 mL

## 2023-04-16 MED ORDER — HEPARIN SOD (PORK) LOCK FLUSH 100 UNIT/ML IV SOLN
500.0000 [IU] | Freq: Once | INTRAVENOUS | Status: AC | PRN
Start: 2023-04-16 — End: 2023-04-16
  Administered 2023-04-16: 500 [IU]

## 2023-04-16 NOTE — Progress Notes (Signed)
 Gynecologic Oncology Return Clinic Visit  Date of Service: 04/16/2023 Referring Provider: Delana Ted Morrison, DO   Assessment & Plan: Lynn Tucker is a 33 y.o. 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  GTN: - 1 year NED with negative beta-hcg. - Beta from today's pending.  If negative, will discontinue beta-hCG monitoring. - Can remove PORT if data from today is negative.. - Can space out follow-up to q6 month. - Signs and symptoms of recurrence reviewed.   Preventative screening: -Pap smear LSIL, HPV HR neg on 07/2022. Repeat in 1 year (07/2023). Can collect at next visit.  RTC 6 months.  Hoy Masters, MD Gynecologic Oncology   Medical Decision Making I personally spent  TOTAL 20 minutes face-to-face and non-face-to-face in the care of this patient, which includes all pre, intra, and post visit time on the date of service.   ----------------------- Reason for Visit: Surveillance  Treatment History: Oncology History Overview Note  Patient with a remote history of molar pregnancy in 2019 followed by undetectable hCG x6 months. Most recent term pregnancy delivered via C-section in 02/2021. Normal placental pathology. Following delivery, patient was scheduled for IUD placement and was noted to have a positive urine pregnancy test 04/07/2021. Due to persistently positive hCG, she underwent a D&C in May with negative pathology. 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 Heterogeneous appearance of the uterus, nonspecific likely reflects patient's reported gestational trophoblastic neoplasia. 2. Solid pulmonary nodules in the right lower lobe and lingula are suspicious for metastatic disease.   01/03/2022 Initial Diagnosis   Gestational trophoblastic  neoplasia   01/08/2022 Imaging   Brain MRI: IMPRESSION: 1. No evidence of intracranial metastatic disease. 2. Small amount of scattered foci of T2 hyperintensity within the white matter of the cerebral hemispheres, nonspecific, more pronounced than expected for age. Differential diagnosis include demyelinating disease, vasculitis, migraine, post inflammatory/infectious processes or early chronic microangiopathy in the setting of uncontrolled risk factors. 3. Small disc herniation at C3-4 appear to result in at least mild spinal canal stenosis.   01/20/2022 - 03/17/2022 Chemotherapy   EMA-CO x4 cycles (2 cycles past negative beta-hcg)   Malignant neoplasm metastatic to lung (HCC)  01/03/2022 Initial Diagnosis   Malignant neoplasm metastatic to lung [facility])     Interval History: Patient reports that she is doing well.  No changes since her last visit.  Did have 1 irregular period in December with her period came early but otherwise regular menstrual cycles.  Sexual activity with female only partner.  Review of systems otherwise negative.   Past Medical/Surgical History: Past Medical History:  Diagnosis Date   ADHD (attention deficit hyperactivity disorder)    Chlamydia 11/11/2015   H. pylori infection 04/2014   off antibiotics at this time   Heart murmur    Hyperlipidemia    Impaired fasting glucose    2013   Molar pregnancy 04/25/2017   Obesity    Pre-diabetes    Short cervix during pregnancy in second trimester 09/21/2018   Sickle cell trait (HCC)    SVD (spontaneous vaginal delivery) 01/31/2019   Ventricular septal defect (VSD)    VSD (ventricular septal defect)    followed by cardiology every 2 years   Wears glasses     Past Surgical History:  Procedure Laterality Date   CERVICAL CERCLAGE N/A 09/22/2018   Procedure:  CERCLAGE CERVICAL;  Surgeon: Danielle Rom, MD;  Location: [facility];  Service: Gynecology;  Laterality: N/A;   CESAREAN SECTION N/A 02/26/2021    Procedure: CESAREAN SECTION;  Surgeon: Diedre Rosaline BRAVO, MD;  Location: [facility];  Service: Obstetrics;  Laterality: N/A;  Primary C/S for Fetal Intolerance to Labor   DILATION AND EVACUATION N/A 08/05/2015   Procedure: DILATATION AND EVACUATION;  Surgeon: Truman Corona, MD;  Location: [facility];  Service: Gynecology;  Laterality: N/A;   DILATION AND EVACUATION N/A 04/25/2017   Procedure: DILATATION AND EVACUATION;  Surgeon: Danielle Rom, MD;  Location: [facility];  Service: Gynecology;  Laterality: N/A;   HYSTEROSCOPY WITH D & C N/A 08/12/2021   Procedure: DILATATION AND CURETTAGE /HYSTEROSCOPY;  Surgeon: Delana Ted Morrison, DO;  Location: [facility];  Service: Gynecology;  Laterality: N/A;   WISDOM TOOTH EXTRACTION      Family History  Problem Relation Age of Onset   Hypertension Mother    Sickle cell trait Father    Hypertension Father    Thyroid disease Paternal Aunt    Cancer Other        maternal side-? type   Hyperlipidemia Other        ? side   Stroke Other        paternal side   Diabetes Other        paternal and maternal side   Breast cancer Neg Hx    Ovarian cancer Neg Hx    Colon cancer Neg Hx    Uterine cancer Neg Hx     Social History   Socioeconomic History   Marital status: Single    Spouse name: Not on file   Number of children: Not on file   Years of education: Not on file   Highest education level: Not on file  Occupational History   Not on file  Tobacco Use   Smoking status: Former    Types: Cigarettes   Smokeless tobacco: Never  Vaping Use   Vaping status: Never Used  Substance and Sexual Activity   Alcohol use: No    Alcohol/week: 1.0 standard drink of alcohol    Types: 1 Standard drinks or equivalent per week    Comment: random   Drug use: No    Comment: THC in past 30 days per pt on 12/26/21   Sexual activity: Yes    Partners: Male    Birth control/protection: None  Other Topics Concern   Not on file  Social  History Narrative   Working at pharmacy, patent attorney.  Exercise - walking.  Has 32yo.   06/2020.   Social Drivers of Corporate Investment Banker Strain: Low Risk  (09/20/2018)   Overall Financial Resource Strain (CARDIA)    Difficulty of Paying Living Expenses: Not hard at all  Food Insecurity: No Food Insecurity (09/20/2018)   Hunger Vital Sign    Worried About Running Out of Food in the Last Year: Never true    Ran Out of Food in the Last Year: Never true  Transportation Needs: Unknown (09/20/2018)   PRAPARE - Administrator, Civil Service (Medical): No    Lack of Transportation (Non-Medical): Not on file  Physical Activity: Not on file  Stress: Stress Concern Present (09/20/2018)   [employer] of Occupational Health - Occupational Stress Questionnaire    Feeling of Stress : To some extent  Social Connections: Not on file    Current Medications: No current outpatient medications  on file. No current facility-administered medications for this visit.  Facility-Administered Medications Ordered in Other Visits:    sodium chloride  flush (NS) 0.9 % injection 10 mL, 10 mL, Intracatheter, PRN, Cross, Melissa D, NP, 10 mL at 04/16/23 1209  Review of Symptoms: Complete 10-system review is positive for: none  Physical Exam: BP 127/68 (BP Location: Right Arm, Patient Position: Sitting)   Pulse 70   Temp 98.1 F (36.7 C) (Tympanic)   Resp 18   Wt 169 lb 12.8 oz (77 kg)   SpO2 100%   BMI 33.16 kg/m  General: Alert, oriented, no acute distress. HEENT: Normocephalic, atraumatic. Neck symmetric without masses. Sclera anicteric.  Chest: Normal work of breathing. Clear to auscultation bilaterally.  Port site clean. Cardiovascular: Regular rate and rhythm, no murmurs. Abdomen: Soft, nontender.  Normoactive bowel sounds.  No masses or hepatosplenomegaly appreciated.   Extremities: Grossly normal range of motion.  Warm, well perfused.  No edema bilaterally. Skin: Hyperpigmented  circular spot on left lower abdomen, possibly at site of prior heparin  injection. GU: Normal appearing external genitalia without erythema, excoriation, or lesions.  Speculum exam reveals normal vagina and cervix. Bimanual exam reveals  normal cevix and uterus; no adnexal masses.  Exam chaperoned by Eleanor Epps, NP   Laboratory & Radiologic Studies:   Lab Results  Component Value Date/Time   HCGQUANT <1 03/16/2023 09:07 AM

## 2023-04-16 NOTE — Patient Instructions (Signed)
 It was a pleasure to see you in clinic today. - Assuming beta is negative, we will get you scheduled for port removal - Return visit planned for 6 months  Thank you very much for allowing me to provide care for you today.  I appreciate your confidence in choosing our Gynecologic Oncology team at [facility].  If you have any questions about your visit today please call our office or send us  a MyChart message and we will get back to you as soon as possible.

## 2023-04-17 LAB — BETA HCG QUANT (REF LAB): hCG Quant: <1 m[IU]/mL

## 2023-06-29 ENCOUNTER — Encounter: Payer: Self-pay | Admitting: Gynecologic Oncology

## 2023-07-16 ENCOUNTER — Inpatient Hospital Stay: Payer: Commercial Managed Care - PPO | Attending: Psychiatry

## 2023-07-16 ENCOUNTER — Encounter: Payer: Self-pay | Admitting: Gynecologic Oncology

## 2023-07-16 DIAGNOSIS — Z8759 Personal history of other complications of pregnancy, childbirth and the puerperium: Secondary | ICD-10-CM | POA: Diagnosis not present

## 2023-07-16 DIAGNOSIS — Z452 Encounter for adjustment and management of vascular access device: Secondary | ICD-10-CM | POA: Insufficient documentation

## 2023-07-16 DIAGNOSIS — C7800 Secondary malignant neoplasm of unspecified lung: Secondary | ICD-10-CM

## 2023-07-16 DIAGNOSIS — Z95828 Presence of other vascular implants and grafts: Secondary | ICD-10-CM

## 2023-07-16 DIAGNOSIS — C58 Malignant neoplasm of placenta: Secondary | ICD-10-CM

## 2023-07-16 MED ORDER — SODIUM CHLORIDE 0.9% FLUSH
10.0000 mL | INTRAVENOUS | Status: DC | PRN
  Administered 2023-07-16: 10 mL

## 2023-07-16 MED ORDER — HEPARIN SOD (PORK) LOCK FLUSH 100 UNIT/ML IV SOLN
500.0000 [IU] | Freq: Once | INTRAVENOUS | Status: AC | PRN
Start: 2023-07-16 — End: 2023-07-16
  Administered 2023-07-16: 500 [IU]

## 2023-07-16 NOTE — Patient Instructions (Signed)

## 2023-10-08 ENCOUNTER — Encounter: Payer: Self-pay | Admitting: Gynecologic Oncology

## 2023-10-11 ENCOUNTER — Encounter: Payer: Self-pay | Admitting: Psychiatry

## 2023-10-11 ENCOUNTER — Inpatient Hospital Stay: Attending: Psychiatry | Admitting: Licensed Clinical Social Worker

## 2023-10-11 ENCOUNTER — Other Ambulatory Visit: Payer: Self-pay

## 2023-10-11 DIAGNOSIS — Z9221 Personal history of antineoplastic chemotherapy: Secondary | ICD-10-CM | POA: Insufficient documentation

## 2023-10-11 DIAGNOSIS — Z8759 Personal history of other complications of pregnancy, childbirth and the puerperium: Secondary | ICD-10-CM | POA: Insufficient documentation

## 2023-10-11 DIAGNOSIS — C58 Malignant neoplasm of placenta: Secondary | ICD-10-CM

## 2023-10-11 NOTE — Progress Notes (Signed)
 [facility] Clinical Social Work  Clinical Social Work was referred by CMA for SDOH needs (PHQ-9).  Clinical Social Worker contacted patient by phone to offer support and assess for needs.    Pt is interested in grief counseling after losing fiance to suicide in February in addition to other stressors (remission, 2 kids).    Interventions: Provided information on different counseling and support services. Will send information on more therapists for ongoing care via MyChart. Provided information on [employer] for support for pt's children      Follow Up Plan:  CSW will send information on therapists through MyChart.    Lynn Tucker E Moriah Shawley, LCSW  Clinical Social Worker [facility]        Patient is participating in a Managed Medicaid Plan:  Yes

## 2023-10-15 ENCOUNTER — Encounter: Payer: Self-pay | Admitting: Gynecologic Oncology

## 2023-10-15 ENCOUNTER — Encounter: Payer: Self-pay | Admitting: Psychiatry

## 2023-10-15 ENCOUNTER — Inpatient Hospital Stay: Payer: Commercial Managed Care - PPO | Admitting: Psychiatry

## 2023-10-15 ENCOUNTER — Inpatient Hospital Stay: Payer: Commercial Managed Care - PPO

## 2023-10-15 ENCOUNTER — Other Ambulatory Visit (HOSPITAL_COMMUNITY)
Admission: RE | Admit: 2023-10-15 | Discharge: 2023-10-15 | Disposition: A | Discharge status: Home / Self Care | Source: Ambulatory Visit | Attending: Psychiatry | Admitting: Psychiatry

## 2023-10-15 VITALS — BP 116/71 | HR 60 | Temp 98.7°F | Resp 18 | Wt 175.4 lb

## 2023-10-15 DIAGNOSIS — C7800 Secondary malignant neoplasm of unspecified lung: Secondary | ICD-10-CM

## 2023-10-15 DIAGNOSIS — Z9221 Personal history of antineoplastic chemotherapy: Secondary | ICD-10-CM | POA: Diagnosis not present

## 2023-10-15 DIAGNOSIS — Z8759 Personal history of other complications of pregnancy, childbirth and the puerperium: Secondary | ICD-10-CM

## 2023-10-15 DIAGNOSIS — O019 Hydatidiform mole, unspecified: Secondary | ICD-10-CM | POA: Diagnosis present

## 2023-10-15 DIAGNOSIS — C58 Malignant neoplasm of placenta: Secondary | ICD-10-CM

## 2023-10-15 DIAGNOSIS — Z95828 Presence of other vascular implants and grafts: Secondary | ICD-10-CM

## 2023-10-15 MED ORDER — HEPARIN SOD (PORK) LOCK FLUSH 100 UNIT/ML IV SOLN
500.0000 [IU] | Freq: Once | INTRAVENOUS | Status: AC | PRN
Start: 2023-10-15 — End: 2023-10-15
  Administered 2023-10-15: 500 [IU]

## 2023-10-15 MED ORDER — SODIUM CHLORIDE 0.9% FLUSH
10.0000 mL | INTRAVENOUS | Status: DC | PRN
  Administered 2023-10-15: 10 mL

## 2023-10-15 NOTE — Progress Notes (Signed)
 Gynecologic Oncology Return Clinic Visit  Date of Service: 10/15/2023 Referring Provider: Delana Ted Morrison, DO   Assessment & Plan: Lynn Tucker is a 33 y.o. 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  GTN: - NED today - Signs and symptoms of recurrence reviewed. - Continue q79mo follow-up.   Preventative screening: -Pap smear LSIL, HPV HR neg on 07/2022. Repeat in 1 year (07/2023). Collected today.   RTC 6 months.  Hoy Masters, MD Gynecologic Oncology   Medical Decision Making I personally spent  TOTAL 20 minutes face-to-face and non-face-to-face in the care of this patient, which includes all pre, intra, and post visit time on the date of service.   ----------------------- Reason for Visit: Surveillance  Treatment History: Oncology History Overview Note  Patient with a remote history of molar pregnancy in 2019 followed by undetectable hCG x6 months. Most recent term pregnancy delivered via C-section in 02/2021. Normal placental pathology. Following delivery, patient was scheduled for IUD placement and was noted to have a positive urine pregnancy test 04/07/2021. Due to persistently positive hCG, she underwent a D&C in May with negative pathology. 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 Heterogeneous appearance of the uterus, nonspecific likely reflects patient's reported gestational trophoblastic neoplasia. 2. Solid pulmonary nodules in the right lower lobe and lingula are suspicious for metastatic disease.   01/03/2022 Initial Diagnosis   Gestational trophoblastic neoplasia   01/08/2022 Imaging   Brain MRI: IMPRESSION: 1. No evidence of intracranial metastatic disease. 2. Small amount of scattered foci of T2 hyperintensity within  the white matter of the cerebral hemispheres, nonspecific, more pronounced than expected for age. Differential diagnosis include demyelinating disease, vasculitis, migraine, post inflammatory/infectious processes or early chronic microangiopathy in the setting of uncontrolled risk factors. 3. Small disc herniation at C3-4 appear to result in at least mild spinal canal stenosis.   01/20/2022 - 03/17/2022 Chemotherapy   EMA-CO x4 cycles (2 cycles past negative beta-hcg)   Malignant neoplasm metastatic to lung (HCC)  01/03/2022 Initial Diagnosis   Malignant neoplasm metastatic to lung [facility])     Interval History: Overall doing well.  Reports that February was a tough month as the father of her child committed suicide.  Otherwise reports that she is having regular periods. Denies abdominal/pelvic pain, unintentional weight loss, change in bowel or bladder habits, early satiety, bloating, nausea/vomiting.     Past Medical/Surgical History: Past Medical History:  Diagnosis Date   ADHD (attention deficit hyperactivity disorder)    Chlamydia 11/11/2015   H. pylori infection 04/2014   off antibiotics at this time   Heart murmur    Hyperlipidemia    Impaired fasting glucose    2013   Molar pregnancy 04/25/2017   Obesity    Pre-diabetes    Short cervix during pregnancy in second trimester 09/21/2018   Sickle cell trait (HCC)    SVD (spontaneous vaginal delivery) 01/31/2019   Ventricular septal defect (VSD)    VSD (ventricular septal defect)    followed by cardiology every 2 years   Wears glasses     Past Surgical History:  Procedure Laterality Date   CERVICAL CERCLAGE N/A 09/22/2018   Procedure: CERCLAGE CERVICAL;  Surgeon: Danielle Rom, MD;  Location: [facility] LD ORS;  Service: Gynecology;  Laterality: N/A;   CESAREAN SECTION N/A 02/26/2021   Procedure: CESAREAN SECTION;  Surgeon:  Diedre Rosaline BRAVO, MD;  Location: [facility] LD ORS;  Service: Obstetrics;  Laterality: N/A;  Primary  C/S for Fetal Intolerance to Labor   DILATION AND EVACUATION N/A 08/05/2015   Procedure: DILATATION AND EVACUATION;  Surgeon: Truman Corona, MD;  Location: [facility] ORS;  Service: Gynecology;  Laterality: N/A;   DILATION AND EVACUATION N/A 04/25/2017   Procedure: DILATATION AND EVACUATION;  Surgeon: Danielle Rom, MD;  Location: [facility] ORS;  Service: Gynecology;  Laterality: N/A;   HYSTEROSCOPY WITH D & C N/A 08/12/2021   Procedure: DILATATION AND CURETTAGE /HYSTEROSCOPY;  Surgeon: Delana Ted Morrison, DO;  Location: [facility];  Service: Gynecology;  Laterality: N/A;   WISDOM TOOTH EXTRACTION      Family History  Problem Relation Age of Onset   Hypertension Mother    Sickle cell trait Father    Hypertension Father    Thyroid disease Paternal Aunt    Cancer Other        maternal side-? type   Hyperlipidemia Other        ? side   Stroke Other        paternal side   Diabetes Other        paternal and maternal side   Breast cancer Neg Hx    Ovarian cancer Neg Hx    Colon cancer Neg Hx    Uterine cancer Neg Hx     Social History   Socioeconomic History   Marital status: Single    Spouse name: Not on file   Number of children: Not on file   Years of education: Not on file   Highest education level: Not on file  Occupational History   Not on file  Tobacco Use   Smoking status: Former    Types: Cigarettes   Smokeless tobacco: Never  Vaping Use   Vaping status: Never Used  Substance and Sexual Activity   Alcohol use: No    Alcohol/week: 1.0 standard drink of alcohol    Types: 1 Standard drinks or equivalent per week    Comment: random   Drug use: No    Comment: THC in past 30 days per pt on 12/26/21   Sexual activity: Yes    Partners: Male    Birth control/protection: None  Other Topics Concern   Not on file  Social History Narrative   Working at pharmacy, Patent attorney.  Exercise - walking.  Has 32yo.   06/2020.   Social Drivers of Research scientist (physical sciences) Strain: Low Risk  (09/20/2018)   Overall Financial Resource Strain (CARDIA)    Difficulty of Paying Living Expenses: Not hard at all  Food Insecurity: No Food Insecurity (10/11/2023)   Hunger Vital Sign    Worried About Running Out of Food in the Last Year: Never true    Ran Out of Food in the Last Year: Never true  Transportation Needs: No Transportation Needs (10/11/2023)   PRAPARE - Administrator, Civil Service (Medical): No    Lack of Transportation (Non-Medical): No  Physical Activity: Not on file  Stress: Stress Concern Present (09/20/2018)   [employer] of Occupational Health - Occupational Stress Questionnaire    Feeling of Stress : To some extent  Social Connections: Not on file    Current Medications: No current outpatient medications on file. No current facility-administered medications for this visit.  Facility-Administered Medications Ordered in Other Visits:    sodium chloride  flush (NS) 0.9 % injection 10 mL, 10 mL, Intracatheter,  PRN, Micheline Setter D, NP, 10 mL at 10/15/23 1225  Review of Symptoms: Complete 10-system review is positive for: none  Physical Exam: BP 116/71 (BP Location: Left Arm, Patient Position: Sitting)   Pulse 60   Temp 98.7 F (37.1 C) (Oral)   Resp 18   Wt 175 lb 6.4 oz (79.6 kg)   SpO2 100%   BMI 34.26 kg/m  General: Alert, oriented, no acute distress. HEENT: Normocephalic, atraumatic. Neck symmetric without masses. Sclera anicteric.  Chest: Normal work of breathing. Clear to auscultation bilaterally.  Port site clean. Cardiovascular: Regular rate and rhythm, no murmurs. Abdomen: Soft, nontender.  Normoactive bowel sounds.  No masses or hepatosplenomegaly appreciated.   Extremities: Grossly normal range of motion.  Warm, well perfused.  No edema bilaterally. Skin: Hyperpigmented circular spot on left lower abdomen, possibly at site of prior heparin  injection. GU: Normal appearing external genitalia without  erythema, excoriation, or lesions.  Speculum exam reveals normal vagina and cervix. Bimanual exam reveals  normal cevix and uterus; no adnexal masses.  Exam chaperoned by Kimberly [place]az[place], CMA    Laboratory & Radiologic Studies:   Lab Results  Component Value Date/Time   HCGQUANT <1 04/16/2023 12:06 PM

## 2023-10-15 NOTE — Patient Instructions (Signed)
 It was a pleasure to see you in clinic today. - Normal exam today - We will follow-up the beta hcg and pap smear results - You should get a call to schedule port removal - Return visit planned for 6 months  Thank you very much for allowing me to provide care for you today.  I appreciate your confidence in choosing our Gynecologic Oncology team at [facility].  If you have any questions about your visit today please call our office or send us  a MyChart message and we will get back to you as soon as possible.

## 2023-10-16 LAB — BETA HCG QUANT (REF LAB): hCG Quant: <1 m[IU]/mL

## 2023-10-17 ENCOUNTER — Ambulatory Visit: Payer: Self-pay | Admitting: Psychiatry

## 2023-10-23 ENCOUNTER — Ambulatory Visit: Payer: Self-pay | Admitting: Psychiatry

## 2023-10-23 LAB — CYTOLOGY - PAP
Comment: NEGATIVE
Diagnosis: NEGATIVE
High risk HPV: NEGATIVE

## 2023-11-01 ENCOUNTER — Ambulatory Visit (HOSPITAL_COMMUNITY)
Admission: RE | Admit: 2023-11-01 | Discharge: 2023-11-01 | Disposition: A | Discharge status: Home / Self Care | Source: Ambulatory Visit | Attending: Psychiatry | Admitting: Psychiatry

## 2023-11-01 ENCOUNTER — Encounter: Payer: Self-pay | Admitting: Gynecologic Oncology

## 2023-11-01 DIAGNOSIS — Z452 Encounter for adjustment and management of vascular access device: Secondary | ICD-10-CM | POA: Diagnosis present

## 2023-11-01 DIAGNOSIS — O019 Hydatidiform mole, unspecified: Secondary | ICD-10-CM | POA: Diagnosis not present

## 2023-11-01 MED ORDER — LIDOCAINE-EPINEPHRINE 1 %-1:100000 IJ SOLN
INTRAMUSCULAR | Status: AC
Start: 2023-11-01 — End: 2023-11-01
  Filled 2023-11-01: qty 1

## 2023-11-01 MED ORDER — LIDOCAINE-EPINEPHRINE 1 %-1:100000 IJ SOLN
20.0000 mL | Freq: Once | INTRAMUSCULAR | Status: AC
  Administered 2023-11-01: 7 mL via INTRADERMAL

## 2024-04-11 ENCOUNTER — Telehealth: Payer: Self-pay

## 2024-04-11 NOTE — Telephone Encounter (Signed)
 Pt is scheduled for a follow up at the first available with Dr.Newton on 3/2 @ 4:00

## 2024-04-11 NOTE — Telephone Encounter (Signed)
-----   Message from Hoy Masters, MD sent at 04/10/2024 10:15 AM EST ----- Regarding: due for follow-up Looks like pt is due for follow-up and isn't scheduled. Please schedule.

## 2024-04-11 NOTE — Telephone Encounter (Signed)
 Per Dr.Newton I LVM for Lynn Tucker to call the office in regards to getting a follow up appointment scheduled

## 2024-05-13 ENCOUNTER — Encounter: Payer: Self-pay | Admitting: Psychiatry

## 2024-05-13 ENCOUNTER — Inpatient Hospital Stay: Attending: Psychiatry | Admitting: Psychiatry

## 2024-05-13 VITALS — BP 120/63 | HR 70 | Temp 98.9°F | Resp 19 | Wt 171.0 lb

## 2024-05-13 DIAGNOSIS — Z8544 Personal history of malignant neoplasm of other female genital organs: Secondary | ICD-10-CM | POA: Insufficient documentation

## 2024-05-13 DIAGNOSIS — Z634 Disappearance and death of family member: Secondary | ICD-10-CM | POA: Insufficient documentation

## 2024-05-13 DIAGNOSIS — F410 Panic disorder [episodic paroxysmal anxiety] without agoraphobia: Secondary | ICD-10-CM | POA: Insufficient documentation

## 2024-05-13 DIAGNOSIS — Z9221 Personal history of antineoplastic chemotherapy: Secondary | ICD-10-CM | POA: Insufficient documentation

## 2024-05-13 DIAGNOSIS — Z08 Encounter for follow-up examination after completed treatment for malignant neoplasm: Secondary | ICD-10-CM | POA: Insufficient documentation

## 2024-05-13 DIAGNOSIS — F419 Anxiety disorder, unspecified: Secondary | ICD-10-CM

## 2024-05-13 DIAGNOSIS — C58 Malignant neoplasm of placenta: Secondary | ICD-10-CM

## 2024-05-13 NOTE — Patient Instructions (Signed)
 It was a pleasure to see you in clinic today. - Normal exam - Referral placed to psychiatry for management of anxiety, panic - Return visit planned for 6 months  Thank you very much for allowing me to provide care for you today.  I appreciate your confidence in choosing our Gynecologic Oncology team at [facility].  If you have any questions about your visit today please call our office or send us  a MyChart message and we will get back to you as soon as possible.

## 2024-06-09 ENCOUNTER — Inpatient Hospital Stay: Admitting: Psychiatry

## 2024-11-10 ENCOUNTER — Inpatient Hospital Stay: Admitting: Psychiatry
